# Patient Record
Sex: Male | Born: 1935 | Race: White | Hispanic: No | Marital: Married | Smoking: Former smoker
Health system: Southern US, Community
[De-identification: ages and names within clinical notes are randomized; demographics above are authoritative.]

## PROBLEM LIST (undated history)

## (undated) DIAGNOSIS — H409 Unspecified glaucoma: Secondary | ICD-10-CM

## (undated) DIAGNOSIS — R972 Elevated prostate specific antigen [PSA]: Secondary | ICD-10-CM

## (undated) DIAGNOSIS — I219 Acute myocardial infarction, unspecified: Secondary | ICD-10-CM

## (undated) DIAGNOSIS — E785 Hyperlipidemia, unspecified: Secondary | ICD-10-CM

## (undated) DIAGNOSIS — H348122 Central retinal vein occlusion, left eye, stable: Secondary | ICD-10-CM

## (undated) DIAGNOSIS — I714 Abdominal aortic aneurysm, without rupture, unspecified: Secondary | ICD-10-CM

## (undated) DIAGNOSIS — I2510 Atherosclerotic heart disease of native coronary artery without angina pectoris: Secondary | ICD-10-CM

## (undated) DIAGNOSIS — D649 Anemia, unspecified: Secondary | ICD-10-CM

## (undated) DIAGNOSIS — I10 Essential (primary) hypertension: Secondary | ICD-10-CM

## (undated) DIAGNOSIS — C44300 Unspecified malignant neoplasm of skin of unspecified part of face: Secondary | ICD-10-CM

## (undated) DIAGNOSIS — IMO0002 Reserved for concepts with insufficient information to code with codable children: Secondary | ICD-10-CM

## (undated) DIAGNOSIS — N289 Disorder of kidney and ureter, unspecified: Secondary | ICD-10-CM

## (undated) HISTORY — PX: INNER EAR SURGERY: SHX679

## (undated) HISTORY — DX: Reserved for concepts with insufficient information to code with codable children: IMO0002

## (undated) HISTORY — PX: HERNIA REPAIR: SHX51

## (undated) HISTORY — DX: Anemia, unspecified: D64.9

## (undated) HISTORY — DX: Disorder of kidney and ureter, unspecified: N28.9

## (undated) HISTORY — DX: Essential (primary) hypertension: I10

## (undated) HISTORY — DX: Atherosclerotic heart disease of native coronary artery without angina pectoris: I25.10

## (undated) HISTORY — DX: Acute myocardial infarction, unspecified: I21.9

## (undated) HISTORY — DX: Abdominal aortic aneurysm, without rupture, unspecified: I71.40

## (undated) HISTORY — DX: Abdominal aortic aneurysm, without rupture: I71.4

## (undated) HISTORY — DX: Elevated prostate specific antigen (PSA): R97.20

## (undated) HISTORY — DX: Hyperlipidemia, unspecified: E78.5

---

## 1979-06-27 HISTORY — PX: CORONARY ARTERY BYPASS GRAFT: SHX141

## 2001-07-24 ENCOUNTER — Encounter: Admission: RE | Admit: 2001-07-24 | Discharge: 2001-07-24 | Payer: Self-pay | Admitting: Urology

## 2001-07-24 ENCOUNTER — Encounter: Payer: Self-pay | Admitting: Urology

## 2001-07-26 ENCOUNTER — Encounter (INDEPENDENT_AMBULATORY_CARE_PROVIDER_SITE_OTHER): Payer: Self-pay

## 2001-07-26 ENCOUNTER — Ambulatory Visit (HOSPITAL_BASED_OUTPATIENT_CLINIC_OR_DEPARTMENT_OTHER): Admission: RE | Admit: 2001-07-26 | Discharge: 2001-07-26 | Payer: Self-pay | Admitting: Urology

## 2001-11-22 ENCOUNTER — Encounter: Payer: Self-pay | Admitting: Internal Medicine

## 2001-11-22 ENCOUNTER — Ambulatory Visit (HOSPITAL_COMMUNITY): Admission: RE | Admit: 2001-11-22 | Discharge: 2001-11-22 | Payer: Self-pay | Admitting: Internal Medicine

## 2001-11-25 ENCOUNTER — Ambulatory Visit (HOSPITAL_COMMUNITY): Admission: RE | Admit: 2001-11-25 | Discharge: 2001-11-25 | Payer: Self-pay | Admitting: Specialist

## 2001-11-30 ENCOUNTER — Encounter: Admission: RE | Admit: 2001-11-30 | Discharge: 2001-11-30 | Payer: Self-pay | Admitting: Internal Medicine

## 2001-11-30 ENCOUNTER — Encounter: Payer: Self-pay | Admitting: Internal Medicine

## 2004-04-15 ENCOUNTER — Inpatient Hospital Stay (HOSPITAL_COMMUNITY): Admission: EM | Admit: 2004-04-15 | Discharge: 2004-04-16 | Payer: Self-pay | Admitting: Emergency Medicine

## 2004-04-26 ENCOUNTER — Ambulatory Visit: Payer: Self-pay

## 2004-05-04 ENCOUNTER — Ambulatory Visit: Payer: Self-pay | Admitting: *Deleted

## 2004-08-17 ENCOUNTER — Ambulatory Visit: Payer: Self-pay | Admitting: *Deleted

## 2004-08-18 ENCOUNTER — Ambulatory Visit: Payer: Self-pay | Admitting: *Deleted

## 2004-10-11 ENCOUNTER — Ambulatory Visit: Payer: Self-pay

## 2005-02-21 ENCOUNTER — Ambulatory Visit: Payer: Self-pay | Admitting: *Deleted

## 2005-03-07 ENCOUNTER — Ambulatory Visit: Payer: Self-pay | Admitting: *Deleted

## 2005-03-17 ENCOUNTER — Ambulatory Visit: Payer: Self-pay | Admitting: *Deleted

## 2005-03-22 ENCOUNTER — Ambulatory Visit: Payer: Self-pay | Admitting: *Deleted

## 2005-03-24 ENCOUNTER — Ambulatory Visit: Payer: Self-pay

## 2005-03-24 ENCOUNTER — Ambulatory Visit: Payer: Self-pay | Admitting: *Deleted

## 2005-04-03 ENCOUNTER — Ambulatory Visit: Payer: Self-pay

## 2005-04-11 ENCOUNTER — Ambulatory Visit: Payer: Self-pay | Admitting: Internal Medicine

## 2005-04-11 ENCOUNTER — Ambulatory Visit: Payer: Self-pay | Admitting: Cardiology

## 2005-04-12 ENCOUNTER — Ambulatory Visit: Payer: Self-pay | Admitting: *Deleted

## 2005-04-17 ENCOUNTER — Encounter: Admission: RE | Admit: 2005-04-17 | Discharge: 2005-04-17 | Payer: Self-pay | Admitting: Nephrology

## 2005-04-25 ENCOUNTER — Ambulatory Visit: Payer: Self-pay | Admitting: Cardiology

## 2005-05-08 ENCOUNTER — Encounter (INDEPENDENT_AMBULATORY_CARE_PROVIDER_SITE_OTHER): Payer: Self-pay | Admitting: Specialist

## 2005-05-08 ENCOUNTER — Ambulatory Visit (HOSPITAL_COMMUNITY): Admission: RE | Admit: 2005-05-08 | Discharge: 2005-05-09 | Payer: Self-pay | Admitting: Nephrology

## 2005-05-23 ENCOUNTER — Ambulatory Visit: Payer: Self-pay | Admitting: *Deleted

## 2005-06-06 ENCOUNTER — Ambulatory Visit: Payer: Self-pay

## 2005-06-13 ENCOUNTER — Encounter (HOSPITAL_COMMUNITY): Admission: RE | Admit: 2005-06-13 | Discharge: 2005-09-11 | Payer: Self-pay | Admitting: Nephrology

## 2005-06-30 ENCOUNTER — Ambulatory Visit: Payer: Self-pay | Admitting: Internal Medicine

## 2005-07-07 ENCOUNTER — Ambulatory Visit: Payer: Self-pay | Admitting: Internal Medicine

## 2005-08-01 ENCOUNTER — Ambulatory Visit: Payer: Self-pay | Admitting: *Deleted

## 2005-10-02 ENCOUNTER — Ambulatory Visit: Payer: Self-pay | Admitting: Internal Medicine

## 2005-10-19 ENCOUNTER — Ambulatory Visit: Payer: Self-pay | Admitting: Internal Medicine

## 2005-11-03 ENCOUNTER — Ambulatory Visit: Payer: Self-pay | Admitting: Gastroenterology

## 2005-11-06 ENCOUNTER — Encounter (HOSPITAL_COMMUNITY): Admission: RE | Admit: 2005-11-06 | Discharge: 2006-02-04 | Payer: Self-pay | Admitting: Nephrology

## 2006-02-28 ENCOUNTER — Encounter (HOSPITAL_COMMUNITY): Admission: RE | Admit: 2006-02-28 | Discharge: 2006-05-29 | Payer: Self-pay | Admitting: Nephrology

## 2006-03-01 ENCOUNTER — Ambulatory Visit: Payer: Self-pay | Admitting: *Deleted

## 2006-03-02 ENCOUNTER — Ambulatory Visit: Payer: Self-pay | Admitting: *Deleted

## 2006-04-09 ENCOUNTER — Ambulatory Visit: Payer: Self-pay | Admitting: Internal Medicine

## 2006-04-30 ENCOUNTER — Encounter: Payer: Self-pay | Admitting: Internal Medicine

## 2006-04-30 ENCOUNTER — Ambulatory Visit: Payer: Self-pay

## 2006-05-09 ENCOUNTER — Ambulatory Visit: Payer: Self-pay

## 2006-06-12 ENCOUNTER — Ambulatory Visit: Payer: Self-pay | Admitting: Internal Medicine

## 2006-06-13 ENCOUNTER — Encounter (HOSPITAL_COMMUNITY): Admission: RE | Admit: 2006-06-13 | Discharge: 2006-09-11 | Payer: Self-pay | Admitting: Nephrology

## 2006-07-16 ENCOUNTER — Ambulatory Visit: Payer: Self-pay | Admitting: *Deleted

## 2006-08-07 ENCOUNTER — Ambulatory Visit: Payer: Self-pay | Admitting: Internal Medicine

## 2006-09-12 ENCOUNTER — Encounter (HOSPITAL_COMMUNITY): Admission: RE | Admit: 2006-09-12 | Discharge: 2006-12-11 | Payer: Self-pay | Admitting: Nephrology

## 2006-11-15 ENCOUNTER — Ambulatory Visit: Payer: Self-pay | Admitting: *Deleted

## 2006-11-15 ENCOUNTER — Ambulatory Visit: Payer: Self-pay

## 2006-11-15 LAB — CONVERTED CEMR LAB
HDL: 37.7 mg/dL — ABNORMAL LOW (ref >39.0)
LDL Cholesterol: 101 mg/dL — ABNORMAL HIGH (ref 0–99)
VLDL: 16 mg/dL (ref 0–40)

## 2006-11-22 ENCOUNTER — Ambulatory Visit: Payer: Self-pay | Admitting: *Deleted

## 2006-12-12 ENCOUNTER — Ambulatory Visit: Payer: Self-pay | Admitting: Internal Medicine

## 2007-01-01 ENCOUNTER — Encounter (HOSPITAL_COMMUNITY): Admission: RE | Admit: 2007-01-01 | Discharge: 2007-04-01 | Payer: Self-pay | Admitting: Nephrology

## 2007-02-07 ENCOUNTER — Ambulatory Visit: Payer: Self-pay | Admitting: Cardiovascular Disease

## 2007-03-11 ENCOUNTER — Ambulatory Visit: Payer: Self-pay | Admitting: Cardiovascular Disease

## 2007-03-11 LAB — CONVERTED CEMR LAB
AST: 19 units/L (ref 0–37)
Albumin: 3.9 g/dL (ref 3.5–5.2)
Alkaline Phosphatase: 47 units/L (ref 39–117)
Total Bilirubin: 0.9 mg/dL (ref 0.3–1.2)
Triglycerides: 88 mg/dL (ref 0–149)

## 2007-05-02 ENCOUNTER — Ambulatory Visit: Payer: Self-pay

## 2007-05-02 ENCOUNTER — Encounter: Payer: Self-pay | Admitting: Cardiovascular Disease

## 2007-05-15 ENCOUNTER — Encounter: Payer: Self-pay | Admitting: Internal Medicine

## 2007-05-15 DIAGNOSIS — I25810 Atherosclerosis of coronary artery bypass graft(s) without angina pectoris: Secondary | ICD-10-CM | POA: Insufficient documentation

## 2007-05-15 DIAGNOSIS — E785 Hyperlipidemia, unspecified: Secondary | ICD-10-CM | POA: Insufficient documentation

## 2007-05-15 DIAGNOSIS — I10 Essential (primary) hypertension: Secondary | ICD-10-CM

## 2007-08-01 ENCOUNTER — Ambulatory Visit: Payer: Self-pay | Admitting: Cardiovascular Disease

## 2007-10-15 ENCOUNTER — Encounter: Payer: Self-pay | Admitting: Internal Medicine

## 2008-03-03 ENCOUNTER — Ambulatory Visit: Payer: Self-pay | Admitting: Cardiovascular Disease

## 2008-03-03 ENCOUNTER — Ambulatory Visit: Payer: Self-pay

## 2008-03-03 LAB — CONVERTED CEMR LAB
Alkaline Phosphatase: 49 units/L (ref 39–117)
Bilirubin, Direct: 0.1 mg/dL (ref 0.0–0.3)
Cholesterol: 110 mg/dL (ref 0–200)
LDL Cholesterol: 60 mg/dL (ref 0–99)
Total Bilirubin: 0.8 mg/dL (ref 0.3–1.2)
Total CHOL/HDL Ratio: 3.4

## 2008-03-08 ENCOUNTER — Telehealth: Payer: Self-pay | Admitting: Internal Medicine

## 2008-03-08 ENCOUNTER — Emergency Department (HOSPITAL_COMMUNITY): Admission: EM | Admit: 2008-03-08 | Discharge: 2008-03-08 | Payer: Self-pay | Admitting: Emergency Medicine

## 2008-03-08 ENCOUNTER — Encounter: Payer: Self-pay | Admitting: Internal Medicine

## 2008-03-09 ENCOUNTER — Telehealth: Payer: Self-pay | Admitting: Internal Medicine

## 2008-03-11 ENCOUNTER — Ambulatory Visit: Payer: Self-pay | Admitting: Internal Medicine

## 2008-03-11 DIAGNOSIS — L02619 Cutaneous abscess of unspecified foot: Secondary | ICD-10-CM

## 2008-03-11 DIAGNOSIS — L03119 Cellulitis of unspecified part of limb: Secondary | ICD-10-CM

## 2008-03-11 DIAGNOSIS — M109 Gout, unspecified: Secondary | ICD-10-CM

## 2008-03-11 DIAGNOSIS — M79609 Pain in unspecified limb: Secondary | ICD-10-CM

## 2008-03-27 ENCOUNTER — Ambulatory Visit: Payer: Self-pay | Admitting: Internal Medicine

## 2008-04-06 ENCOUNTER — Ambulatory Visit: Payer: Self-pay | Admitting: Internal Medicine

## 2008-04-07 ENCOUNTER — Encounter: Payer: Self-pay | Admitting: Internal Medicine

## 2008-04-07 LAB — CONVERTED CEMR LAB
Basophils Absolute: 0 10*3/uL (ref 0.0–0.1)
Calcium: 9.4 mg/dL (ref 8.4–10.5)
Creatinine, Ser: 1.7 mg/dL — ABNORMAL HIGH (ref 0.4–1.5)
GFR calc Af Amer: 50 mL/min
Glucose, Bld: 106 mg/dL — ABNORMAL HIGH (ref 70–99)
HCT: 36.1 % — ABNORMAL LOW (ref 39.0–52.0)
Hemoglobin: 12.2 g/dL — ABNORMAL LOW (ref 13.0–17.0)
MCHC: 33.8 g/dL (ref 30.0–36.0)
Monocytes Absolute: 0.5 10*3/uL (ref 0.1–1.0)
Neutro Abs: 3.5 10*3/uL (ref 1.4–7.7)
RDW: 14.4 % (ref 11.5–14.6)
Sodium: 143 meq/L (ref 135–145)
Uric Acid, Serum: 9.6 mg/dL — ABNORMAL HIGH (ref 4.0–7.8)

## 2008-04-09 ENCOUNTER — Ambulatory Visit: Payer: Self-pay | Admitting: Internal Medicine

## 2008-07-02 ENCOUNTER — Ambulatory Visit: Payer: Self-pay | Admitting: Internal Medicine

## 2008-07-03 LAB — CONVERTED CEMR LAB
Calcium: 9.3 mg/dL (ref 8.4–10.5)
Creatinine, Ser: 1.5 mg/dL (ref 0.4–1.5)
GFR calc Af Amer: 57 mL/min
Glucose, Bld: 106 mg/dL — ABNORMAL HIGH (ref 70–99)
Sodium: 143 meq/L (ref 135–145)

## 2008-07-06 ENCOUNTER — Ambulatory Visit: Payer: Self-pay | Admitting: Internal Medicine

## 2008-08-26 ENCOUNTER — Encounter: Payer: Self-pay | Admitting: Internal Medicine

## 2008-08-31 ENCOUNTER — Encounter: Payer: Self-pay | Admitting: Cardiovascular Disease

## 2008-08-31 ENCOUNTER — Ambulatory Visit: Payer: Self-pay | Admitting: Cardiovascular Disease

## 2008-08-31 DIAGNOSIS — I714 Abdominal aortic aneurysm, without rupture, unspecified: Secondary | ICD-10-CM | POA: Insufficient documentation

## 2008-09-02 ENCOUNTER — Ambulatory Visit: Payer: Self-pay

## 2008-10-12 ENCOUNTER — Ambulatory Visit: Payer: Self-pay | Admitting: Internal Medicine

## 2008-10-12 LAB — CONVERTED CEMR LAB
Calcium: 9.2 mg/dL (ref 8.4–10.5)
Creatinine, Ser: 1.6 mg/dL — ABNORMAL HIGH (ref 0.4–1.5)
GFR calc non Af Amer: 43.74 mL/min (ref >60)
Glucose, Bld: 106 mg/dL — ABNORMAL HIGH (ref 70–99)
HDL: 36.2 mg/dL — ABNORMAL LOW (ref >39.00)
Sodium: 145 meq/L (ref 135–145)
Total CHOL/HDL Ratio: 3
Uric Acid, Serum: 7.8 mg/dL (ref 4.0–7.8)

## 2008-10-19 ENCOUNTER — Ambulatory Visit: Payer: Self-pay | Admitting: Internal Medicine

## 2008-10-19 DIAGNOSIS — N259 Disorder resulting from impaired renal tubular function, unspecified: Secondary | ICD-10-CM | POA: Insufficient documentation

## 2008-12-30 ENCOUNTER — Telehealth: Payer: Self-pay | Admitting: Internal Medicine

## 2008-12-30 ENCOUNTER — Ambulatory Visit: Payer: Self-pay | Admitting: Internal Medicine

## 2009-01-19 ENCOUNTER — Telehealth (INDEPENDENT_AMBULATORY_CARE_PROVIDER_SITE_OTHER): Payer: Self-pay | Admitting: *Deleted

## 2009-01-20 ENCOUNTER — Encounter: Payer: Self-pay | Admitting: Cardiovascular Disease

## 2009-01-28 ENCOUNTER — Ambulatory Visit: Payer: Self-pay | Admitting: Internal Medicine

## 2009-01-28 LAB — CONVERTED CEMR LAB
Albumin: 3.8 g/dL (ref 3.5–5.2)
CO2: 29 meq/L (ref 19–32)
Chloride: 101 meq/L (ref 96–112)
Cholesterol: 107 mg/dL (ref 0–200)
Creatinine, Ser: 1.5 mg/dL (ref 0.4–1.5)
HDL: 45.3 mg/dL (ref >39.00)
LDL Cholesterol: 54 mg/dL (ref 0–99)
Potassium: 4.2 meq/L (ref 3.5–5.1)
Sodium: 145 meq/L (ref 135–145)
Total CHOL/HDL Ratio: 2
Total Protein: 6.7 g/dL (ref 6.0–8.3)
Triglycerides: 41 mg/dL (ref 0.0–149.0)
Uric Acid, Serum: 6.1 mg/dL (ref 4.0–7.8)
VLDL: 8.2 mg/dL (ref 0.0–40.0)

## 2009-02-03 ENCOUNTER — Telehealth: Payer: Self-pay | Admitting: Internal Medicine

## 2009-02-12 ENCOUNTER — Ambulatory Visit: Payer: Self-pay | Admitting: Internal Medicine

## 2009-03-04 ENCOUNTER — Encounter: Payer: Self-pay | Admitting: Cardiovascular Disease

## 2009-03-04 ENCOUNTER — Ambulatory Visit: Payer: Self-pay

## 2009-03-25 ENCOUNTER — Ambulatory Visit: Payer: Self-pay | Admitting: Cardiovascular Disease

## 2009-03-29 ENCOUNTER — Ambulatory Visit: Payer: Self-pay | Admitting: Internal Medicine

## 2009-05-04 ENCOUNTER — Encounter: Payer: Self-pay | Admitting: Internal Medicine

## 2009-06-29 ENCOUNTER — Ambulatory Visit: Payer: Self-pay | Admitting: Internal Medicine

## 2009-06-29 LAB — CONVERTED CEMR LAB
ALT: 11 units/L (ref 0–53)
Albumin: 4.1 g/dL (ref 3.5–5.2)
Alkaline Phosphatase: 62 units/L (ref 39–117)
Bilirubin, Direct: 0.1 mg/dL (ref 0.0–0.3)
Chloride: 99 meq/L (ref 96–112)
Cholesterol: 126 mg/dL (ref 0–200)
GFR calc non Af Amer: 43.66 mL/min (ref >60)
LDL Cholesterol: 64 mg/dL (ref 0–99)
Potassium: 4 meq/L (ref 3.5–5.1)
Sodium: 141 meq/L (ref 135–145)
Total Protein: 7.5 g/dL (ref 6.0–8.3)
Triglycerides: 91 mg/dL (ref 0.0–149.0)
VLDL: 18.2 mg/dL (ref 0.0–40.0)

## 2009-07-08 ENCOUNTER — Ambulatory Visit: Payer: Self-pay | Admitting: Internal Medicine

## 2009-07-08 DIAGNOSIS — Z87891 Personal history of nicotine dependence: Secondary | ICD-10-CM

## 2009-09-06 ENCOUNTER — Ambulatory Visit: Payer: Self-pay

## 2009-09-06 ENCOUNTER — Encounter: Payer: Self-pay | Admitting: Cardiovascular Disease

## 2009-09-09 ENCOUNTER — Ambulatory Visit: Payer: Self-pay | Admitting: Internal Medicine

## 2009-09-09 LAB — CONVERTED CEMR LAB
BUN: 32 mg/dL — ABNORMAL HIGH (ref 6–23)
Chloride: 104 meq/L (ref 96–112)
Creatinine, Ser: 1.5 mg/dL (ref 0.4–1.5)
GFR calc non Af Amer: 47.02 mL/min (ref >60)
Potassium: 4.1 meq/L (ref 3.5–5.1)

## 2009-09-27 ENCOUNTER — Telehealth: Payer: Self-pay | Admitting: Internal Medicine

## 2009-09-30 ENCOUNTER — Ambulatory Visit: Payer: Self-pay | Admitting: Cardiovascular Disease

## 2009-12-06 ENCOUNTER — Ambulatory Visit: Payer: Self-pay | Admitting: Internal Medicine

## 2009-12-06 LAB — CONVERTED CEMR LAB
ALT: 10 units/L (ref 0–53)
AST: 18 units/L (ref 0–37)
Albumin: 3.5 g/dL (ref 3.5–5.2)
Calcium: 8.8 mg/dL (ref 8.4–10.5)
GFR calc non Af Amer: 65.83 mL/min (ref >60)
Glucose, Bld: 84 mg/dL (ref 70–99)
HDL: 32.9 mg/dL — ABNORMAL LOW (ref >39.00)
Potassium: 4.8 meq/L (ref 3.5–5.1)
Sodium: 142 meq/L (ref 135–145)
Total Protein: 6 g/dL (ref 6.0–8.3)
Triglycerides: 83 mg/dL (ref 0.0–149.0)
VLDL: 16.6 mg/dL (ref 0.0–40.0)

## 2009-12-10 ENCOUNTER — Ambulatory Visit: Payer: Self-pay | Admitting: Internal Medicine

## 2010-03-31 ENCOUNTER — Ambulatory Visit: Payer: Self-pay | Admitting: Internal Medicine

## 2010-03-31 LAB — CONVERTED CEMR LAB
ALT: 13 units/L (ref 0–53)
AST: 18 units/L (ref 0–37)
Albumin: 3.9 g/dL (ref 3.5–5.2)
Alkaline Phosphatase: 53 units/L (ref 39–117)
Basophils Absolute: 0 10*3/uL (ref 0.0–0.1)
Basophils Relative: 0.4 % (ref 0.0–3.0)
Bilirubin Urine: NEGATIVE
Calcium: 8.9 mg/dL (ref 8.4–10.5)
Eosinophils Relative: 9.9 % — ABNORMAL HIGH (ref 0.0–5.0)
GFR calc non Af Amer: 52.14 mL/min (ref >60)
HCT: 35.8 % — ABNORMAL LOW (ref 39.0–52.0)
HDL: 44.7 mg/dL (ref >39.00)
Hemoglobin, Urine: NEGATIVE
Hemoglobin: 12 g/dL — ABNORMAL LOW (ref 13.0–17.0)
Lymphocytes Relative: 16.1 % (ref 12.0–46.0)
Monocytes Relative: 7 % (ref 3.0–12.0)
Neutro Abs: 4.8 10*3/uL (ref 1.4–7.7)
PSA: 11.42 ng/mL — ABNORMAL HIGH (ref 0.10–4.00)
Potassium: 4.8 meq/L (ref 3.5–5.1)
RBC: 3.97 M/uL — ABNORMAL LOW (ref 4.22–5.81)
RDW: 15.5 % — ABNORMAL HIGH (ref 11.5–14.6)
Sodium: 141 meq/L (ref 135–145)
Total CHOL/HDL Ratio: 2
WBC: 7.2 10*3/uL (ref 4.5–10.5)
pH: 6.5 (ref 5.0–8.0)

## 2010-04-04 ENCOUNTER — Ambulatory Visit: Payer: Self-pay | Admitting: Internal Medicine

## 2010-04-04 DIAGNOSIS — R972 Elevated prostate specific antigen [PSA]: Secondary | ICD-10-CM

## 2010-04-04 DIAGNOSIS — D649 Anemia, unspecified: Secondary | ICD-10-CM

## 2010-04-21 ENCOUNTER — Encounter: Payer: Self-pay | Admitting: Cardiovascular Disease

## 2010-04-22 ENCOUNTER — Ambulatory Visit: Payer: Self-pay

## 2010-04-22 ENCOUNTER — Ambulatory Visit: Payer: Self-pay | Admitting: Cardiovascular Disease

## 2010-04-22 ENCOUNTER — Encounter: Payer: Self-pay | Admitting: Cardiovascular Disease

## 2010-06-01 ENCOUNTER — Encounter: Payer: Self-pay | Admitting: Internal Medicine

## 2010-07-26 ENCOUNTER — Other Ambulatory Visit: Payer: Self-pay | Admitting: Internal Medicine

## 2010-07-26 ENCOUNTER — Ambulatory Visit
Admission: RE | Admit: 2010-07-26 | Discharge: 2010-07-26 | Payer: Self-pay | Source: Home / Self Care | Attending: Internal Medicine | Admitting: Internal Medicine

## 2010-07-26 LAB — CBC WITH DIFFERENTIAL/PLATELET
Basophils Relative: 0.5 % (ref 0.0–3.0)
Eosinophils Relative: 10.7 % — ABNORMAL HIGH (ref 0.0–5.0)
Lymphocytes Relative: 17.1 % (ref 12.0–46.0)
Monocytes Relative: 7.7 % (ref 3.0–12.0)
Neutrophils Relative %: 64 % (ref 43.0–77.0)
RBC: 4.13 Mil/uL — ABNORMAL LOW (ref 4.22–5.81)
WBC: 7.4 10*3/uL (ref 4.5–10.5)

## 2010-07-26 LAB — BASIC METABOLIC PANEL
Calcium: 9.1 mg/dL (ref 8.4–10.5)
Creatinine, Ser: 1.7 mg/dL — ABNORMAL HIGH (ref 0.4–1.5)
Sodium: 143 mEq/L (ref 135–145)

## 2010-07-28 NOTE — Assessment & Plan Note (Signed)
Summary: 4 MIO ROV /NWS  #   Vital Signs:  Patient profile:   77 year old male Height:      70 inches Weight:      168 pounds BMI:     24.19 Temp:     97.3 degrees F oral Pulse rate:   68 / minute Pulse rhythm:   regular Resp:     16 per minute BP sitting:   130 / 68  (left arm) Cuff size:   regular  Vitals Entered By: Lanier Prude, CMA(AAMA) (April 04, 2010 10:16 AM) CC: 4 mo f/u Is Patient Diabetic? No   Primary Care Provider:  Tresa Garter MD  CC:  4 mo f/u.  History of Present Illness: The patient presents for a follow up of hypertension, CAD, hyperlipidemia The patient presents for a preventive health examination  Patient past medical history, social history, and family history reviewed in detail no significant changes.  Patient is physically active. Depression is negative and mood is good. Hearing is normal w/hearing aids, and able to perform activities of daily living. Risk of falling is negligible and home safety has been reviewed and is appropriate. Patient has normal height, weight, and visual acuity. Patient has been counseled on age-appropriate routine health concerns for screening and prevention. Education, counseling done.    Preventive Screening-Counseling & Management  Alcohol-Tobacco     Alcohol drinks/day: 0     Smoking Status: quit > 6 months  Caffeine-Diet-Exercise     Caffeine Counseling: decrease use of caffeine     Diet Counseling: to improve diet; diet is suboptimal     Type of exercise: walking     Exercise (avg: min/session): 30-60     Depression Counseling: not indicated; screening negative for depression  Hep-HIV-STD-Contraception     Hepatitis Risk: no risk noted     Sun Exposure-Excessive: no  Safety-Violence-Falls     Seat Belt Use: yes     Firearms in the Home: firearms in the home     Violence in the Home: no risk noted     Fall Risk Counseling: not indicated; no significant falls noted      Sexual History:  currently  monogamous.    Current Medications (verified): 1)  Terazosin Hcl 5 Mg Caps (Terazosin Hcl) .... Once Daily 2)  Aspirin 325 Mg  Tabs (Aspirin) .... 1/2 Once Daily 3)  Lasix 80 Mg  Tabs (Furosemide) .... Two Times A Day 4)  Nitroglycerin 0.4 Mg  Subl (Nitroglycerin) .... As Needed 5)  Colcrys 0.6 Mg Tabs (Colchicine) .Marland Kitchen.. 1 By Mouth Once Daily or Up To Three Times A Day As Needed Gout 6)  Allopurinol 100 Mg Tabs (Allopurinol) .... 2 By Mouth Daily 7)  Vytorin 10-40 Mg Tabs (Ezetimibe-Simvastatin) .... Take One Tab P.o. At Bedtime 8)  Promethazine Hcl 25 Mg  Tabs (Promethazine Hcl) .Marland Kitchen.. 1 By Mouth Three Times A Day As Needed Nausea 9)  Lumigan 0.03 % Soln (Bimatoprost) .... One Drop Each Eye At Night 10)  Camagam Eye Drops .... One Drop Each Eye Two Times A Day  Allergies (verified): 1)  ! Sulfacetamide Sodium-Sulfur (Sulfacetamide Sodium-Sulfur)  Past History:  Past Surgical History: Last updated: 08/28/2008 CABG 1981  Family History: Last updated: 03/11/2008 Family History Hypertension  Social History: Last updated: 03/29/2009 Retired Married x 68 years in 2010 Former Smoker Alcohol use-no  Past Medical History: Coronary artery disease s/p CABG and multiple PCI's Hyperlipidemia Hypertension AAA - 5 cm on 2010 assessment Gout  2009 Renal insufficiency Elev PSA Dr Earlene Plater  Social History: Smoking Status:  quit > 6 months Hepatitis Risk:  no risk noted Sun Exposure-Excessive:  no Seat Belt Use:  yes Sexual History:  currently monogamous  Review of Systems  The patient denies anorexia, fever, weight loss, weight gain, vision loss, decreased hearing, hoarseness, chest pain, syncope, dyspnea on exertion, peripheral edema, prolonged cough, headaches, hemoptysis, abdominal pain, melena, hematochezia, severe indigestion/heartburn, hematuria, incontinence, genital sores, muscle weakness, suspicious skin lesions, transient blindness, difficulty walking, depression, unusual weight  change, abnormal bleeding, enlarged lymph nodes, angioedema, and testicular masses.    Physical Exam  General:  Pt is alert and oriented, in no acute distress, elderly gentleman. HEENT: normal Neck: normal carotid upstrokes without bruits, JVP normal Lungs: CTA CV: RRR without grade 2/6 systolic ejection murmur, best heard at the right upper sternal border Abd: soft, NT, positive BS, no bruit, no organomegaly,abdominal aorta is easily palpable and enlarged. Ext: no clubbing, cyanosis. peripheral pulses 2+ and equal. trace pretibial edema bilaterally Skin: warm and dry without rash  Ears:  hearing aids B Heart:  Normal rate and regular rhythm. S1 and S2 normal without gallop. 2/.6 murmur Rectal:  per Dr Ricky Stabs:  per Dr Earlene Plater Prostate:  per Dr Earlene Plater   Impression & Recommendations:  Problem # 1:  Preventive Health Care (ICD-V70.0) Assessment Improved  No need for screen colon Overall doing well, age appropriate education and counseling updated and referral for appropriate preventive services done unless declined, immunizations up to date or declined, diet counseling done if overweight, urged to quit smoking if smokes, most recent labs reviewed and current ordered if appropriate, ecg reviewed or declined (interpretation per ECG scanned in the EMR if done); information regarding Medicare Preventation requirements given if appropriate.  The labs were reviewed with the patient.   Problem # 2:  HYPERLIPIDEMIA (ICD-272.4)  His updated medication list for this problem includes:    Vytorin 10-40 Mg Tabs (Ezetimibe-simvastatin) .Marland Kitchen... Take one tab p.o. at bedtime  Problem # 3:  GOUT, UNSPECIFIED (ICD-274.9) Assessment: Improved  His updated medication list for this problem includes:    Colcrys 0.6 Mg Tabs (Colchicine) .Marland Kitchen... 1 by mouth once daily or up to three times a day as needed gout    Allopurinol 100 Mg Tabs (Allopurinol) .Marland Kitchen... 2 by mouth daily  Problem # 4:  CORONARY ARTERY  DISEASE (ICD-414.00) Assessment: Unchanged  His updated medication list for this problem includes:    Terazosin Hcl 5 Mg Caps (Terazosin hcl) ..... Once daily    Aspirin 325 Mg Tabs (Aspirin) .Marland Kitchen... 1/2 once daily    Lasix 80 Mg Tabs (Furosemide) .Marland Kitchen..Marland Kitchen Two times a day    Nitroglycerin 0.4 Mg Subl (Nitroglycerin) .Marland Kitchen... As needed  Problem # 5:  HYPERTENSION (ICD-401.9)  His updated medication list for this problem includes:    Terazosin Hcl 5 Mg Caps (Terazosin hcl) ..... Once daily    Lasix 80 Mg Tabs (Furosemide) .Marland Kitchen..Marland Kitchen Two times a day  Problem # 6:  RENAL INSUFFICIENCY (ICD-588.9)  Problem # 7:  PSA, INCREASED (ICD-790.93) Assessment: Unchanged Dr Earlene Plater q 6 months   Problem # 8:  ANEMIA, NORMOCYTIC (ICD-285.9) Assessment: Comment Only Likely due to CRI  Complete Medication List: 1)  Terazosin Hcl 5 Mg Caps (Terazosin hcl) .... Once daily 2)  Aspirin 325 Mg Tabs (Aspirin) .... 1/2 once daily 3)  Lasix 80 Mg Tabs (Furosemide) .... Two times a day 4)  Nitroglycerin 0.4 Mg Subl (Nitroglycerin) .... As  needed 5)  Colcrys 0.6 Mg Tabs (Colchicine) .Marland Kitchen.. 1 by mouth once daily or up to three times a day as needed gout 6)  Allopurinol 100 Mg Tabs (Allopurinol) .... 2 by mouth daily 7)  Vytorin 10-40 Mg Tabs (Ezetimibe-simvastatin) .... Take one tab p.o. at bedtime 8)  Promethazine Hcl 25 Mg Tabs (Promethazine hcl) .Marland Kitchen.. 1 by mouth three times a day as needed nausea 9)  Lumigan 0.03 % Soln (Bimatoprost) .... One drop each eye at night 10)  Camagam Eye Drops  .... One drop each eye two times a day  Other Orders: Medicare -1st Annual Wellness Visit [phone])  Patient Instructions: 1)  Please schedule a follow-up appointment in 4 months. 2)  BMP prior to visit, ICD-9: 3)  CBC w/ Diff prior to visit, ICD-9:585  280.9   Immunization History:  Influenza Immunization History:    Influenza:  historical (03/11/2010)  Pneumovax Immunization History:    Pneumovax:  historical  (04/04/2002)

## 2010-07-28 NOTE — Assessment & Plan Note (Signed)
Summary: f56m   Visit Type:  6 months follow up Primary Provider:  Tresa Garter MD  CC:  no cardiac complaints.  History of Present Illness: this is an 77 year old gentleman who presents today for followup of coronary artery disease and AAA. He underwent remote multivessel coronary bypass surgery in 1981 and has had multiple subsequent PCI procedures. He has had a 5cm AAA followed by serial ultrasound.  The patient has no complaints today. He denies chest pain, dyspnea, orthopnea, PND, or edema. He denies abdominal pain.  Current Medications (verified): 1)  Terazosin Hcl 5 Mg Caps (Terazosin Hcl) .... Once Daily 2)  Aspirin 325 Mg  Tabs (Aspirin) .... 1/2 Once Daily 3)  Lasix 80 Mg  Tabs (Furosemide) .... Two Times A Day 4)  Nitroglycerin 0.4 Mg  Subl (Nitroglycerin) .... As Needed 5)  Allopurinol 100 Mg Tabs (Allopurinol) .... 2 By Mouth Daily 6)  Vytorin 10-40 Mg Tabs (Ezetimibe-Simvastatin) .... Take One Tab P.o. At Bedtime 7)  Promethazine Hcl 25 Mg  Tabs (Promethazine Hcl) .Marland Kitchen.. 1 By Mouth Three Times A Day As Needed Nausea 8)  Lumigan 0.03 % Soln (Bimatoprost) .... One Drop Each Eye At Night 9)  Camagam Eye Drops .... One Drop Each Eye Two Times A Day  Allergies: 1)  ! Sulfacetamide Sodium-Sulfur (Sulfacetamide Sodium-Sulfur)  Past History:  Past medical history reviewed for relevance to current acute and chronic problems.  Past Medical History: Coronary artery disease s/p CABG and multiple PCI's Hyperlipidemia Hypertension AAA - 5 cm on 2011 assessment Gout 2009 Renal insufficiency Elev PSA Dr Earlene Plater  Review of Systems       Negative except as per HPI   Vital Signs:  Patient profile:   77 year old male Height:      70 inches Weight:      164.50 pounds BMI:     23.69 Pulse rate:   54 / minute Pulse rhythm:   regular Resp:     18 per minute BP sitting:   143 / 63  (left arm)  Vitals Entered By: Vikki Ports (April 22, 2010 9:39 AM)  Physical  Exam  General:  Pt is alert and oriented, in no acute distress, elderly gentleman. HEENT: normal Neck: normal carotid upstrokes without bruits, JVP normal Lungs: CTA CV: RRR without grade 2/6 systolic ejection murmur, best heard at the right upper sternal border Abd: soft, NT, positive BS, no bruit, no organomegaly,abdominal aorta is easily palpable and enlarged. Ext: no clubbing, cyanosis. peripheral pulses 2+ and equal. trace pretibial edema bilaterally Skin: warm and dry without rash    EKG  Procedure date:  04/22/2010  Findings:      Sinus brady 54 bpm, otherwise within normal limits.  Impression & Recommendations:  Problem # 1:  ABDOMINAL AORTIC ANEURYSM (ICD-441.4) Duplex results reviewed from today's study and AAA remains 5.1x4.9 cm max diameter. Continue to monitor with every 6 months ultrasound studies. Discussed symptoms of AAA rupture in detail with pt and he understands that abdominal pain is a medical emergency that should prompt an immediate EMS call. Continue risk reduction measures as below. He is not a beta blocker candidate because his resting HR is 54 bpm.  Problem # 2:  HYPERLIPIDEMIA (ICD-272.4) At goal on statin Rx.  His updated medication list for this problem includes:    Vytorin 10-40 Mg Tabs (Ezetimibe-simvastatin) .Marland Kitchen... Take one tab p.o. at bedtime  CHOL: 103 (03/31/2010)   LDL: 46 (03/31/2010)   HDL: 44.70 (03/31/2010)  TG: 63.0 (03/31/2010)  Problem # 3:  HYPERTENSION (ICD-401.9) Continue current Rx.  His updated medication list for this problem includes:    Terazosin Hcl 5 Mg Caps (Terazosin hcl) ..... Once daily    Aspirin 325 Mg Tabs (Aspirin) .Marland Kitchen... 1/2 once daily    Lasix 80 Mg Tabs (Furosemide) .Marland Kitchen..Marland Kitchen Two times a day  BP today: 143/63 Prior BP: 130/68 (04/04/2010)  Labs Reviewed: K+: 4.8 (03/31/2010) Creat: : 1.4 (03/31/2010)   Chol: 103 (03/31/2010)   HDL: 44.70 (03/31/2010)   LDL: 46 (03/31/2010)   TG: 63.0 (03/31/2010)  Orders: EKG  w/ Interpretation (93000)  Problem # 4:  CORONARY ARTERY DISEASE (ICD-414.00) Stable without angina.  His updated medication list for this problem includes:    Aspirin 325 Mg Tabs (Aspirin) .Marland Kitchen... 1/2 once daily    Nitroglycerin 0.4 Mg Subl (Nitroglycerin) .Marland Kitchen... As needed  Patient Instructions: 1)  Your physician recommends that you continue on your current medications as directed. Please refer to the Current Medication list given to you today. 2)  Your physician wants you to follow-up in:  6 MONTHS.  You will receive a reminder letter in the mail two months in advance. If you don't receive a letter, please call our office to schedule the follow-up appointment. 3)  Your physician has requested that you have an abdominal aorta duplex in 6 MONTHS. During this test, an ultrasound is used to evaluate the aorta. Allow 30 minutes for this exam. Do not eat after midnight the day before and avoid carbonated beverages. There are no restrictions or special instructions.

## 2010-07-28 NOTE — Assessment & Plan Note (Signed)
Summary: f36m   Visit Type:  6 months follow yp Primary Provider:  Tresa Garter MD  CC:  No complains.  History of Present Illness: this is an 77 year old gentleman who presents today for followup of coronary artery disease and AAA. He underwent remote multivessel coronary bypass surgery in 1981 and has had multiple subsequent PCI procedures. He has been able to remain active and is maintaining a garden this spring. He denies chest pain, dyspnea, or lightheadedness. He admits to chronic leg edema but this is been under good control on twice daily furosemide. He denies abdominal pain.  Current Medications (verified): 1)  Terazosin Hcl 5 Mg Caps (Terazosin Hcl) .... Once Daily 2)  Aspirin 325 Mg  Tabs (Aspirin) .... 1/2 Once Daily 3)  Lasix 80 Mg  Tabs (Furosemide) .... Two Times A Day 4)  Nitroglycerin 0.4 Mg  Subl (Nitroglycerin) .... As Needed 5)  Colcrys 0.6 Mg Tabs (Colchicine) .Marland Kitchen.. 1 By Mouth Once Daily or Up To Three Times A Day As Needed Gout 6)  Allopurinol 100 Mg Tabs (Allopurinol) .... 2 By Mouth Daily 7)  Vytorin 10-40 Mg Tabs (Ezetimibe-Simvastatin) .... Take One Tab P.o. At Bedtime 8)  Promethazine Hcl 25 Mg  Tabs (Promethazine Hcl) .Marland Kitchen.. 1 By Mouth Three Times A Day As Needed Nausea 9)  Lumigan 0.03 % Soln (Bimatoprost) .... One Drop Each Eye At Night 10)  Camagam Eye Drops .... One Drop Each Eye Two Times A Day  Allergies: 1)  ! Sulfacetamide Sodium-Sulfur (Sulfacetamide Sodium-Sulfur)  Past History:  Past medical history reviewed for relevance to current acute and chronic problems.  Past Medical History: Reviewed history from 03/25/2009 and no changes required. Coronary artery disease s/p CABG and multiple PCI's Hyperlipidemia Hypertension AAA - 5 cm on 2010 assessment Gout 2009 Renal insufficiency  Review of Systems       Negative except as per HPI   Vital Signs:  Patient profile:   77 year old male Height:      70 inches Weight:      173  pounds Pulse rate:   55 / minute Pulse rhythm:   regular Resp:     18 per minute BP sitting:   140 / 76  (left arm) Cuff size:   small  Vitals Entered By: Vikki Ports (September 30, 2009 3:14 PM)  Physical Exam  General:  Pt is alert and oriented, elderly gentleman, in no acute distress. HEENT: normal Neck: normal carotid upstrokes with bilateral bruits, JVP normal Lungs: CTA CV: RRR with 2/6 systolic ejection murmur or gallop Abd: soft, NT, positive BS, no bruit, no organomegaly, enlarged, pulsatile abdominal aorta is easily palpable. Ext: no clubbing, cyanosis, or edema. peripheral pulses 2+ and equal Skin: warm and dry without rash    EKG  Procedure date:  09/30/2009  Findings:      Sinus bradycardia, left atrial enlargement, nonspecific ST and T wave abnormality.  Impression & Recommendations:  Problem # 1:  CORONARY ARTERY DISEASE (ICD-414.00) The patient is stable without angina. Will continue his current medical program which includes antiplatelet therapy with aspirin and treatment of his hypercholesterolemia with Vytorin. Would normally have him on a beta blocker in the setting of CAD and abdominal aortic aneurysm, but his resting bradycardia precludes this. His updated medication list for this problem includes:    Aspirin 325 Mg Tabs (Aspirin) .Marland Kitchen... 1/2 once daily    Nitroglycerin 0.4 Mg Subl (Nitroglycerin) .Marland Kitchen... As needed  Problem # 2:  ABDOMINAL AORTIC  ANEURYSM (ICD-441.4) Most recent abdominal ultrasound was reviewed and demonstrates a stable abdominal aortic aneurysm with maximum dimension 5.1 cm. He is due for his six-month followup ultrasound in October.  Problem # 3:  HYPERLIPIDEMIA (ICD-272.4) Lipids excellent with LDL less than 70. Continue Vytorin. His updated medication list for this problem includes:    Vytorin 10-40 Mg Tabs (Ezetimibe-simvastatin) .Marland Kitchen... Take one tab p.o. at bedtime  CHOL: 126 (06/29/2009)   LDL: 64 (06/29/2009)   HDL: 44.20  (06/29/2009)   TG: 91.0 (06/29/2009)  Patient Instructions: 1)  Your physician recommends that you schedule a follow-up appointment in: 6 MONTHS--PT HAS TRIPLE A DUPLEX SCHED FOR SEPT '11--COULD WE MOVE THIS TO THE DATE HE COMES IN FOR FOLLOW UP APPOINT IN OCT

## 2010-07-28 NOTE — Progress Notes (Signed)
Summary: Colcrys  Phone Note Refill Request Message from:  Fax from Pharmacy on September 27, 2009 1:47 PM  Colcrys refill request  Initial call taken by: Lucious Groves,  September 27, 2009 1:47 PM  Follow-up for Phone Call        OK to ref x 2 Follow-up by: Tresa Garter MD,  September 27, 2009 5:07 PM  Additional Follow-up for Phone Call Additional follow up Details #1::        Colchicine is on med list, ok to use same directions and qty?  Additional Follow-up by: Lamar Sprinkles, CMA,  September 27, 2009 6:40 PM    Additional Follow-up for Phone Call Additional follow up Details #2::    Yes please Follow-up by: Tresa Garter MD,  September 28, 2009 7:25 AM  New/Updated Medications: COLCRYS 0.6 MG TABS (COLCHICINE) 1 by mouth once daily or up to three times a day as needed gout Prescriptions: COLCRYS 0.6 MG TABS (COLCHICINE) 1 by mouth once daily or up to three times a day as needed gout  #90 x 5   Entered by:   Lamar Sprinkles, CMA   Authorized by:   Tresa Garter MD   Signed by:   Lamar Sprinkles, CMA on 09/28/2009   Method used:   Electronically to        [facility]* (retail)       [address] [place], [place]  [address]       Ph: [phone]       Fax: [phone]   RxID:   [id number]

## 2010-07-28 NOTE — Assessment & Plan Note (Signed)
Summary: 3 MTH FU  $50--STC   Vital Signs:  Patient profile:   77 year old male Weight:      175 pounds Temp:     96.8 degrees F oral Pulse rate:   56 / minute BP sitting:   122 / 54  Vitals Entered By: Tora Perches (July 08, 2009 8:31 AM) CC: f/u Is Patient Diabetic? No   Primary Care Provider:  Tresa Garter MD  CC:  f/u.  History of Present Illness: The patient presents for a follow up of hypertension, gout, hyperlipidemia.   Preventive Screening-Counseling & Management  Alcohol-Tobacco     Smoking Status: quit  Current Medications (verified): 1)  Terazosin Hcl 5 Mg Caps (Terazosin Hcl) .... Once Daily 2)  Aspirin 325 Mg  Tabs (Aspirin) .... 1/2 Once Daily 3)  Lasix 80 Mg  Tabs (Furosemide) .... Two Times A Day 4)  Nitroglycerin 0.4 Mg  Subl (Nitroglycerin) .... As Needed 5)  Colchicine 0.6 Mg Tabs (Colchicine) .Marland Kitchen.. 1 By Mouth Once Daily or Up To Three Times A Day As Needed Gout 6)  Allopurinol 100 Mg Tabs (Allopurinol) .... 2 By Mouth Daily 7)  Vytorin 10-40 Mg Tabs (Ezetimibe-Simvastatin) .... Take One Tab P.o. At Bedtime 8)  Promethazine Hcl 25 Mg  Tabs (Promethazine Hcl) .Marland Kitchen.. 1 By Mouth Three Times A Day As Needed Nausea 9)  Lumigan 0.03 % Soln (Bimatoprost) .... One Drop Each Eye At Night 10)  Camagam Eye Drops .... One Drop Each Eye Two Times A Day  Allergies: 1)  ! Sulfacetamide Sodium-Sulfur (Sulfacetamide Sodium-Sulfur)  Past History:  Past Medical History: Last updated: 03/25/2009 Coronary artery disease s/p CABG and multiple PCI's Hyperlipidemia Hypertension AAA - 5 cm on 2010 assessment Gout 2009 Renal insufficiency  Social History: Last updated: 03/29/2009 Retired Married x 68 years in 2010 Former Smoker Alcohol use-no  Physical Exam  General:  Pt is alert and oriented, in no acute distress, elderly gentleman. HEENT: normal Neck: normal carotid upstrokes without bruits, JVP normal Lungs: CTA CV: RRR without grade 2/6  systolic ejection murmur, best heard at the right upper sternal border Abd: soft, NT, positive BS, no bruit, no organomegaly,abdominal aorta is easily palpable and enlarged. Ext: no clubbing, cyanosis. peripheral pulses 2+ and equal. trace pretibial edema bilaterally Skin: warm and dry without rash  Mouth:  Oral mucosa and oropharynx without lesions or exudates.  Teeth in good repair. Lungs:  Normal respiratory effort, chest expands symmetrically. Lungs are clear to auscultation, no crackles or wheezes. Heart:  Normal rate and regular rhythm. S1 and S2 normal without gallop. 2/.6 murmur Abdomen:  Bowel sounds positive,abdomen soft and non-tender without masses, organomegaly or hernias noted. Msk:  WNL Neurologic:  alert & oriented X3.   Skin:  WNL Psych:  Oriented X3.     Impression & Recommendations:  Problem # 1:  GOUT, UNSPECIFIED (ICD-274.9) Assessment Improved  His updated medication list for this problem includes:    Colchicine 0.6 Mg Tabs (Colchicine) .Marland Kitchen... 1 by mouth once daily or up to three times a day as needed gout    Allopurinol 100 Mg Tabs (Allopurinol) .Marland Kitchen... 2 by mouth daily  Problem # 2:  ABDOMINAL AORTIC ANEURYSM (ICD-441.4) Assessment: Comment Only  Problem # 3:  CORONARY ARTERY DISEASE (ICD-414.00) Assessment: Unchanged  His updated medication list for this problem includes:    Terazosin Hcl 5 Mg Caps (Terazosin hcl) ..... Once daily    Aspirin 325 Mg Tabs (Aspirin) .Marland Kitchen... 1/2 once daily  Lasix 80 Mg Tabs (Furosemide) .Marland Kitchen..Marland Kitchen Two times a day    Nitroglycerin 0.4 Mg Subl (Nitroglycerin) .Marland Kitchen... As needed  Problem # 4:  HYPERLIPIDEMIA (ICD-272.4) Assessment: Unchanged  His updated medication list for this problem includes:    Vytorin 10-40 Mg Tabs (Ezetimibe-simvastatin) .Marland Kitchen... Take one tab p.o. at bedtime  Problem # 5:  HYPERTENSION (ICD-401.9) Assessment: Comment Only  His updated medication list for this problem includes:    Terazosin Hcl 5 Mg Caps  (Terazosin hcl) ..... Once daily    Lasix 80 Mg Tabs (Furosemide) .Marland Kitchen..Marland Kitchen Two times a day  Complete Medication List: 1)  Terazosin Hcl 5 Mg Caps (Terazosin hcl) .... Once daily 2)  Aspirin 325 Mg Tabs (Aspirin) .... 1/2 once daily 3)  Lasix 80 Mg Tabs (Furosemide) .... Two times a day 4)  Nitroglycerin 0.4 Mg Subl (Nitroglycerin) .... As needed 5)  Colchicine 0.6 Mg Tabs (Colchicine) .Marland Kitchen.. 1 by mouth once daily or up to three times a day as needed gout 6)  Allopurinol 100 Mg Tabs (Allopurinol) .... 2 by mouth daily 7)  Vytorin 10-40 Mg Tabs (Ezetimibe-simvastatin) .... Take one tab p.o. at bedtime 8)  Promethazine Hcl 25 Mg Tabs (Promethazine hcl) .Marland Kitchen.. 1 by mouth three times a day as needed nausea 9)  Lumigan 0.03 % Soln (Bimatoprost) .... One drop each eye at night 10)  Camagam Eye Drops  .... One drop each eye two times a day  Patient Instructions: 1)  Please schedule a follow-up appointment in 2 months. 2)  Try to eat more raw plant food, fresh and dry fruit, raw almonds, leafy vegetables, whole foods and less red meat, less animal fat. Poultry and fish is better for you than pork and beef. Avoid processed foods (canned soups, hot dogs, sausage, bacon , frozen dinners). Avoid corn syrup, high fructose syrup or aspartam and Splenda  containing drinks. Honey, Agave and Stevia are better sweeteners. Make your own  dressing with olive oil, wine vinegar, lemon juce, garlic etc. for your salads.

## 2010-07-28 NOTE — Assessment & Plan Note (Signed)
Summary: 2 MO ROV /NWS  #   Vital Signs:  Patient profile:   77 year old male Weight:      171 pounds Temp:     97.5 degrees F oral Pulse rate:   51 / minute BP sitting:   126 / 54  (left arm)  Vitals Entered By: Tora Perches (September 09, 2009 11:14 AM) CC: f/u Is Patient Diabetic? No   Primary Care Provider:  Tresa Garter MD  CC:  f/u.  History of Present Illness: The patient presents for a follow up of hypertension, BPH, hyperlipidemia, CAD   Preventive Screening-Counseling & Management  Alcohol-Tobacco     Smoking Status: quit  Current Medications (verified): 1)  Terazosin Hcl 5 Mg Caps (Terazosin Hcl) .... Once Daily 2)  Aspirin 325 Mg  Tabs (Aspirin) .... 1/2 Once Daily 3)  Lasix 80 Mg  Tabs (Furosemide) .... Two Times A Day 4)  Nitroglycerin 0.4 Mg  Subl (Nitroglycerin) .... As Needed 5)  Colchicine 0.6 Mg Tabs (Colchicine) .Marland Kitchen.. 1 By Mouth Once Daily or Up To Three Times A Day As Needed Gout 6)  Allopurinol 100 Mg Tabs (Allopurinol) .... 2 By Mouth Daily 7)  Vytorin 10-40 Mg Tabs (Ezetimibe-Simvastatin) .... Take One Tab P.o. At Bedtime 8)  Promethazine Hcl 25 Mg  Tabs (Promethazine Hcl) .Marland Kitchen.. 1 By Mouth Three Times A Day As Needed Nausea 9)  Lumigan 0.03 % Soln (Bimatoprost) .... One Drop Each Eye At Night 10)  Camagam Eye Drops .... One Drop Each Eye Two Times A Day  Allergies: 1)  ! Sulfacetamide Sodium-Sulfur (Sulfacetamide Sodium-Sulfur)  Past History:  Past Medical History: Last updated: 03/25/2009 Coronary artery disease s/p CABG and multiple PCI's Hyperlipidemia Hypertension AAA - 5 cm on 2010 assessment Gout 2009 Renal insufficiency  Social History: Last updated: 03/29/2009 Retired Married x 68 years in 2010 Former Smoker Alcohol use-no  Physical Exam  General:  Pt is alert and oriented, in no acute distress, elderly gentleman. HEENT: normal Neck: normal carotid upstrokes without bruits, JVP normal Lungs: CTA CV: RRR without  grade 2/6 systolic ejection murmur, best heard at the right upper sternal border Abd: soft, NT, positive BS, no bruit, no organomegaly,abdominal aorta is easily palpable and enlarged. Ext: no clubbing, cyanosis. peripheral pulses 2+ and equal. trace pretibial edema bilaterally Skin: warm and dry without rash  Psych:  Cognition and judgment appear intact. Alert and cooperative with normal attention span and concentration. No apparent delusions, illusions, hallucinations   Impression & Recommendations:  Problem # 1:  HYPERTENSION (ICD-401.9) Assessment Unchanged  His updated medication list for this problem includes:    Terazosin Hcl 5 Mg Caps (Terazosin hcl) ..... Once daily    Lasix 80 Mg Tabs (Furosemide) .Marland Kitchen..Marland Kitchen Two times a day  BP today: 126/54 Prior BP: 122/54 (07/08/2009)  Labs Reviewed: K+: 4.0 (06/29/2009) Creat: : 1.6 (06/29/2009)   Chol: 126 (06/29/2009)   HDL: 44.20 (06/29/2009)   LDL: 64 (06/29/2009)   TG: 91.0 (06/29/2009)  Orders: TLB-BMP (Basic Metabolic Panel-BMET) (80048-METABOL)  Problem # 2:  HYPERLIPIDEMIA (ICD-272.4) Assessment: Unchanged  His updated medication list for this problem includes:    Vytorin 10-40 Mg Tabs (Ezetimibe-simvastatin) .Marland Kitchen... Take one tab p.o. at bedtime  Problem # 3:  CORONARY ARTERY DISEASE (ICD-414.00) Assessment: Unchanged  His updated medication list for this problem includes:    Terazosin Hcl 5 Mg Caps (Terazosin hcl) ..... Once daily    Aspirin 325 Mg Tabs (Aspirin) .Marland Kitchen... 1/2 once daily  Lasix 80 Mg Tabs (Furosemide) .Marland Kitchen..Marland Kitchen Two times a day    Nitroglycerin 0.4 Mg Subl (Nitroglycerin) .Marland Kitchen... As needed  Problem # 4:  RENAL INSUFFICIENCY (ICD-588.9) Assessment: Unchanged bmet  Complete Medication List: 1)  Terazosin Hcl 5 Mg Caps (Terazosin hcl) .... Once daily 2)  Aspirin 325 Mg Tabs (Aspirin) .... 1/2 once daily 3)  Lasix 80 Mg Tabs (Furosemide) .... Two times a day 4)  Nitroglycerin 0.4 Mg Subl (Nitroglycerin) .... As  needed 5)  Colchicine 0.6 Mg Tabs (Colchicine) .Marland Kitchen.. 1 by mouth once daily or up to three times a day as needed gout 6)  Allopurinol 100 Mg Tabs (Allopurinol) .... 2 by mouth daily 7)  Vytorin 10-40 Mg Tabs (Ezetimibe-simvastatin) .... Take one tab p.o. at bedtime 8)  Promethazine Hcl 25 Mg Tabs (Promethazine hcl) .Marland Kitchen.. 1 by mouth three times a day as needed nausea 9)  Lumigan 0.03 % Soln (Bimatoprost) .... One drop each eye at night 10)  Camagam Eye Drops  .... One drop each eye two times a day  Patient Instructions: 1)  Please schedule a follow-up appointment in 3 months. 2)  BMP prior to visit, ICD-9: 3)  Hepatic Panel prior to visit, ICD-9: 4)  Lipid Panel prior to visit, ICD-9:401.1  272.0

## 2010-07-28 NOTE — Miscellaneous (Signed)
Summary: Orders Update  Clinical Lists Changes  Orders: Added new Test order of Abdominal Aorta Duplex (Abd Aorta Duplex) - Signed 

## 2010-07-28 NOTE — Letter (Signed)
Summary: [facility]   Imported By: Sherian Rein 06/13/2010 12:36:25  _____________________________________________________________________  External Attachment:    Type:   Image     Comment:   External Document

## 2010-07-28 NOTE — Assessment & Plan Note (Signed)
Summary: 3 MO ROV /NWS  #   Vital Signs:  Patient profile:   77 year old male Height:      70 inches Weight:      165.75 pounds BMI:     23.87 O2 Sat:      98 % on Room air Temp:     97.2 degrees F oral Pulse rate:   59 / minute BP sitting:   140 / 60  (left arm) Cuff size:   regular  Vitals Entered By: Lucious Groves (December 10, 2009 8:54 AM)  O2 Flow:  Room air CC: 3 mo rtn ov./kb Is Patient Diabetic? No Pain Assessment Patient in pain? no      Comments Patient notes that he needs refill of Vytorin and is not taking Colcrys./kb   Primary Care Provider:  Tresa Garter MD  CC:  3 mo rtn ov./kb.  History of Present Illness: The patient presents for a follow up of hypertension, CAD, hyperlipidemia   Current Medications (verified): 1)  Terazosin Hcl 5 Mg Caps (Terazosin Hcl) .... Once Daily 2)  Aspirin 325 Mg  Tabs (Aspirin) .... 1/2 Once Daily 3)  Lasix 80 Mg  Tabs (Furosemide) .... Two Times A Day 4)  Nitroglycerin 0.4 Mg  Subl (Nitroglycerin) .... As Needed 5)  Colcrys 0.6 Mg Tabs (Colchicine) .Marland Kitchen.. 1 By Mouth Once Daily or Up To Three Times A Day As Needed Gout 6)  Allopurinol 100 Mg Tabs (Allopurinol) .... 2 By Mouth Daily 7)  Vytorin 10-40 Mg Tabs (Ezetimibe-Simvastatin) .... Take One Tab P.o. At Bedtime 8)  Promethazine Hcl 25 Mg  Tabs (Promethazine Hcl) .Marland Kitchen.. 1 By Mouth Three Times A Day As Needed Nausea 9)  Lumigan 0.03 % Soln (Bimatoprost) .... One Drop Each Eye At Night 10)  Camagam Eye Drops .... One Drop Each Eye Two Times A Day  Allergies (verified): 1)  ! Sulfacetamide Sodium-Sulfur (Sulfacetamide Sodium-Sulfur)  Past History:  Past Medical History: Last updated: 03/25/2009 Coronary artery disease s/p CABG and multiple PCI's Hyperlipidemia Hypertension AAA - 5 cm on 2010 assessment Gout 2009 Renal insufficiency  Social History: Last updated: 03/29/2009 Retired Married x 68 years in 2010 Former Smoker Alcohol use-no  Review of  Systems  The patient denies chest pain, syncope, dyspnea on exertion, and prolonged cough.    Physical Exam  General:  Pt is alert and oriented, in no acute distress, elderly gentleman. HEENT: normal Neck: normal carotid upstrokes without bruits, JVP normal Lungs: CTA CV: RRR without grade 2/6 systolic ejection murmur, best heard at the right upper sternal border Abd: soft, NT, positive BS, no bruit, no organomegaly,abdominal aorta is easily palpable and enlarged. Ext: no clubbing, cyanosis. peripheral pulses 2+ and equal. trace pretibial edema bilaterally Skin: warm and dry without rash  Mouth:  Oral mucosa and oropharynx without lesions or exudates.  Teeth in good repair. Lungs:  Normal respiratory effort, chest expands symmetrically. Lungs are clear to auscultation, no crackles or wheezes. Heart:  Normal rate and regular rhythm. S1 and S2 normal without gallop. 2/.6 murmur Abdomen:  Bowel sounds positive,abdomen soft and non-tender without masses, organomegaly or hernias noted. Msk:  WNL Neurologic:  alert & oriented X3.   Skin:  WNL Psych:  Cognition and judgment appear intact. Alert and cooperative with normal attention span and concentration. No apparent delusions, illusions, hallucinations   Impression & Recommendations:  Problem # 1:  GOUT, UNSPECIFIED (ICD-274.9) Assessment Improved  His updated medication list for this problem includes:  Colcrys 0.6 Mg Tabs (Colchicine) .Marland Kitchen... 1 by mouth once daily or up to three times a day as needed gout    Allopurinol 100 Mg Tabs (Allopurinol) .Marland Kitchen... 2 by mouth daily  Problem # 2:  HYPERLIPIDEMIA (ICD-272.4) Assessment: Improved  His updated medication list for this problem includes:    Vytorin 10-40 Mg Tabs (Ezetimibe-simvastatin) .Marland Kitchen... Take one tab p.o. at bedtime  Problem # 3:  CORONARY ARTERY DISEASE (ICD-414.00) Assessment: Unchanged  His updated medication list for this problem includes:    Terazosin Hcl 5 Mg Caps  (Terazosin hcl) ..... Once daily    Aspirin 325 Mg Tabs (Aspirin) .Marland Kitchen... 1/2 once daily    Lasix 80 Mg Tabs (Furosemide) .Marland Kitchen..Marland Kitchen Two times a day    Nitroglycerin 0.4 Mg Subl (Nitroglycerin) .Marland Kitchen... As needed  Problem # 4:  HYPERTENSION (ICD-401.9) Assessment: Improved  His updated medication list for this problem includes:    Terazosin Hcl 5 Mg Caps (Terazosin hcl) ..... Once daily    Lasix 80 Mg Tabs (Furosemide) .Marland Kitchen..Marland Kitchen Two times a day  Problem # 5:  ABDOMINAL AORTIC ANEURYSM (ICD-441.4) [place] q 6 months   Complete Medication List: 1)  Terazosin Hcl 5 Mg Caps (Terazosin hcl) .... Once daily 2)  Aspirin 325 Mg Tabs (Aspirin) .... 1/2 once daily 3)  Lasix 80 Mg Tabs (Furosemide) .... Two times a day 4)  Nitroglycerin 0.4 Mg Subl (Nitroglycerin) .... As needed 5)  Colcrys 0.6 Mg Tabs (Colchicine) .Marland Kitchen.. 1 by mouth once daily or up to three times a day as needed gout 6)  Allopurinol 100 Mg Tabs (Allopurinol) .... 2 by mouth daily 7)  Vytorin 10-40 Mg Tabs (Ezetimibe-simvastatin) .... Take one tab p.o. at bedtime 8)  Promethazine Hcl 25 Mg Tabs (Promethazine hcl) .Marland Kitchen.. 1 by mouth three times a day as needed nausea 9)  Lumigan 0.03 % Soln (Bimatoprost) .... One drop each eye at night 10)  Camagam Eye Drops  .... One drop each eye two times a day  Patient Instructions: 1)  Please schedule a follow-up appointment in 4 months well w/labs.

## 2010-08-02 ENCOUNTER — Ambulatory Visit (INDEPENDENT_AMBULATORY_CARE_PROVIDER_SITE_OTHER): Payer: Medicare Other | Admitting: Internal Medicine

## 2010-08-02 ENCOUNTER — Encounter: Payer: Self-pay | Admitting: Internal Medicine

## 2010-08-02 DIAGNOSIS — D485 Neoplasm of uncertain behavior of skin: Secondary | ICD-10-CM

## 2010-08-02 DIAGNOSIS — L570 Actinic keratosis: Secondary | ICD-10-CM | POA: Insufficient documentation

## 2010-08-02 DIAGNOSIS — E785 Hyperlipidemia, unspecified: Secondary | ICD-10-CM

## 2010-08-02 DIAGNOSIS — N259 Disorder resulting from impaired renal tubular function, unspecified: Secondary | ICD-10-CM

## 2010-08-02 DIAGNOSIS — I10 Essential (primary) hypertension: Secondary | ICD-10-CM

## 2010-08-11 NOTE — Assessment & Plan Note (Signed)
Summary: 4 MTH FU STC   Vital Signs:  Patient profile:   77 year old male Weight:      166 pounds BMI:     23.90 O2 Sat:      98 % Temp:     97.7 degrees F oral Pulse rate:   55 / minute BP sitting:   140 / 58  (left arm) Cuff size:   regular  Vitals Entered By: Lamar Sprinkles, CMA (August 02, 2010 10:03 AM) CC: 4 mth f/u/SD   Primary Care Provider:  Tresa Garter MD  CC:  4 mth f/u/SD.  History of Present Illness: The patient presents for a follow up of hypertension, gout, hyperlipidemia and CAD  Current Medications (verified): 1)  Terazosin Hcl 5 Mg Caps (Terazosin Hcl) .... Once Daily 2)  Aspirin 325 Mg  Tabs (Aspirin) .... 1/2 Once Daily 3)  Lasix 80 Mg  Tabs (Furosemide) .... Two Times A Day 4)  Nitroglycerin 0.4 Mg  Subl (Nitroglycerin) .... As Needed 5)  Allopurinol 100 Mg Tabs (Allopurinol) .... 2 By Mouth Daily 6)  Vytorin 10-40 Mg Tabs (Ezetimibe-Simvastatin) .... Take One Tab P.o. At Bedtime 7)  Promethazine Hcl 25 Mg  Tabs (Promethazine Hcl) .Marland Kitchen.. 1 By Mouth Three Times A Day As Needed Nausea 8)  Lumigan 0.03 % Soln (Bimatoprost) .... One Drop Each Eye At Night 9)  Camagam Eye Drops .... One Drop Each Eye Two Times A Day  Allergies (verified): 1)  ! Sulfacetamide Sodium-Sulfur (Sulfacetamide Sodium-Sulfur)  Past History:  Past Medical History: Last updated: 04/22/2010 Coronary artery disease s/p CABG and multiple PCI's Hyperlipidemia Hypertension AAA - 5 cm on 2011 assessment Gout 2009 Renal insufficiency Elev PSA Dr Earlene Plater  Social History: Last updated: 03/29/2009 Retired Married x 68 years in 2010 Former Smoker Alcohol use-no  Review of Systems  The patient denies fever, chest pain, dyspnea on exertion, peripheral edema, abdominal pain, melena, hematochezia, and severe indigestion/heartburn.    Physical Exam  General:  Pt is alert and oriented, in no acute distress, elderly gentleman. HEENT: normal Neck: normal carotid upstrokes  without bruits, JVP normal Lungs: CTA CV: RRR without grade 2/6 systolic ejection murmur, best heard at the right upper sternal border Abd: soft, NT, positive BS, no bruit, no organomegaly,abdominal aorta is easily palpable and enlarged. Ext: no clubbing, cyanosis. peripheral pulses 2+ and equal. trace pretibial edema bilaterally Skin: warm and dry without rash  Ears:  hearing aids B, hard hearing Mouth:  Oral mucosa and oropharynx without lesions or exudates.  Teeth in good repair. Lungs:  Normal respiratory effort, chest expands symmetrically. Lungs are clear to auscultation, no crackles or wheezes. Heart:  Normal rate and regular rhythm. S1 and S2 normal without gallop. 2/.6 murmur Abdomen:  Bowel sounds positive,abdomen soft and non-tender without masses, organomegaly or hernias noted. Msk:  WNL Neurologic:  alert & oriented X3.   Skin:  AKs, SKs Growth on R temple Psych:  Cognition and judgment appear intact. Alert and cooperative with normal attention span and concentration. No apparent delusions, illusions, hallucinations   Impression & Recommendations:  Problem # 1:  RENAL INSUFFICIENCY (ICD-588.9) Assessment Deteriorated Drink more water Recheck labs in 2 months  The labs were reviewed with the patient.   Problem # 2:  ANEMIA, NORMOCYTIC (ICD-285.9) Assessment: Unchanged  Hgb: 12.3 (07/26/2010)   Hct: 37.1 (07/26/2010)   Platelets: 171.0 (07/26/2010) RBC: 4.13 (07/26/2010)   RDW: 14.7 (07/26/2010)   WBC: 7.4 (07/26/2010) MCV: 89.9 (07/26/2010)  MCHC: 33.1 (07/26/2010) TSH: 1.04 (03/31/2010)  Problem # 3:  HYPERTENSION (ICD-401.9) Assessment: Improved  His updated medication list for this problem includes:    Terazosin Hcl 5 Mg Caps (Terazosin hcl) ..... Once daily    Lasix 80 Mg Tabs (Furosemide) .Marland Kitchen..Marland Kitchen Two times a day  BP today: 140/58 Prior BP: 143/63 (04/22/2010)  Labs Reviewed: K+: 4.3 (07/26/2010) Creat: : 1.7 (07/26/2010)   Chol: 103 (03/31/2010)   HDL:  44.70 (03/31/2010)   LDL: 46 (03/31/2010)   TG: 63.0 (03/31/2010)  Problem # 4:  HYPERLIPIDEMIA (ICD-272.4) Assessment: Unchanged  His updated medication list for this problem includes:    Vytorin 10-40 Mg Tabs (Ezetimibe-simvastatin) .Marland Kitchen... Take one tab p.o. at bedtime  Problem # 5:  CORONARY ARTERY DISEASE (ICD-414.00) Assessment: Unchanged  His updated medication list for this problem includes:    Terazosin Hcl 5 Mg Caps (Terazosin hcl) ..... Once daily    Aspirin 325 Mg Tabs (Aspirin) .Marland Kitchen... 1/2 once daily    Lasix 80 Mg Tabs (Furosemide) .Marland Kitchen..Marland Kitchen Two times a day    Nitroglycerin 0.4 Mg Subl (Nitroglycerin) .Marland Kitchen... 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 Once daily 2)  Aspirin 325 Mg Tabs (Aspirin) .... 1/2 once daily 3)  Lasix 80 Mg Tabs (Furosemide) .... Two times a day 4)  Nitroglycerin 0.4 Mg Subl (Nitroglycerin) .... As needed 5)  Allopurinol 100 Mg Tabs (Allopurinol) .... 2 by mouth daily 6)  Vytorin 10-40 Mg Tabs (Ezetimibe-simvastatin) .... Take one tab p.o. at bedtime 7)  Promethazine Hcl 25 Mg Tabs (Promethazine hcl) .Marland Kitchen.. 1 by mouth three times a day as needed nausea 8)  Lumigan 0.03 % Soln (Bimatoprost) .... One drop each eye at night 9)  Camagam Eye Drops  .... One drop each eye two times a day  Patient Instructions: 1)  Please schedule a follow-up appointment in 1-2 months for skin bx with me. 2)  BMP prior to visit, ICD-9:401.1   Orders Added: 1)  Est. Patient Level IV [04540]

## 2010-09-20 ENCOUNTER — Other Ambulatory Visit (INDEPENDENT_AMBULATORY_CARE_PROVIDER_SITE_OTHER): Payer: Medicare Other

## 2010-09-20 DIAGNOSIS — I10 Essential (primary) hypertension: Secondary | ICD-10-CM

## 2010-09-20 LAB — BASIC METABOLIC PANEL
BUN: 32 mg/dL — ABNORMAL HIGH (ref 6–23)
Creatinine, Ser: 1.5 mg/dL (ref 0.4–1.5)
GFR: 48.39 mL/min — ABNORMAL LOW (ref >60.00)
Potassium: 4 mEq/L (ref 3.5–5.1)

## 2010-09-27 ENCOUNTER — Encounter: Payer: Self-pay | Admitting: Internal Medicine

## 2010-09-27 ENCOUNTER — Ambulatory Visit (INDEPENDENT_AMBULATORY_CARE_PROVIDER_SITE_OTHER): Payer: Medicare Other | Admitting: Internal Medicine

## 2010-09-27 VITALS — BP 150/70 | HR 64 | Temp 97.6°F | Resp 16 | Ht 70.5 in | Wt 167.0 lb

## 2010-09-27 DIAGNOSIS — L821 Other seborrheic keratosis: Secondary | ICD-10-CM

## 2010-09-27 DIAGNOSIS — C44319 Basal cell carcinoma of skin of other parts of face: Secondary | ICD-10-CM

## 2010-09-27 DIAGNOSIS — L570 Actinic keratosis: Secondary | ICD-10-CM

## 2010-09-27 DIAGNOSIS — D485 Neoplasm of uncertain behavior of skin: Secondary | ICD-10-CM

## 2010-09-27 DIAGNOSIS — D229 Melanocytic nevi, unspecified: Secondary | ICD-10-CM

## 2010-09-27 NOTE — Progress Notes (Signed)
  Subjective:    Patient ID: Robert Ritter, male    DOB: Dec 31, 1919, 76 y.o.   MRN: [id number]  HPI  He is here for skin Bx x 2 and cryo x 3 on his face  Review of Systems     Objective:   Physical Exam        Assessment & Plan:   Procedure Note :     Procedure :  Skin biopsy   Indication:  Changing mole (s ),  Suspicious lesion(s)   Risks including unsuccessful procedure , bleeding, infection, bruising, scar, a need for another complete procedure and others were explained to the patient in detail as well as the benefits. Informed consent was obtained and signed.   The patient was placed in a decubitus position.  Lesion #1 on   R temple  measuring 11x 8    mm   Skin over lesion #1  was prepped with Betadine and alcohol  and anesthetized with 1.5 cc of 2% lidocaine and epinephrine, using a 25-gauge 1 inch needle.  Shave fragment biopsy with a sterile Dermablade was carried out in the usual fashion. It was attempted to remove with  1 mm free margins using a  Hyfrecator  to destroy the rest of the lesion  left behind and for hemostasis. Band-Aid was applied with antibiotic ointment.    Lesion #2 on  R temple   measuring 11x11  mm   Skin over lesion #2  was prepped with Betadine and alcohol  and anesthetized with 1.5 cc of 2% lidocaine and epinephrine, using a 25-gauge 1 inch needle.  Shave fragment biopsy with a sterile Dermablade was carried out in the usual fashion.   Hyfrecator was used to destroy the rest of the lesion  left behind and for hemostasis. Band-Aid was applied with antibiotic ointment.    Tolerated well. Complications none.      Postprocedure instructions :    A Band-Aid should be  changed twice daily. You can take a shower tomorrow.  Keep the wounds clean. You can wash them with liquid soap and water. Pat dry with gauze or a Kleenex tissue  Before applying antibiotic ointment and a Band-Aid.   You need to report immediately  if fever, chills or any  signs of infection develop.  Procedure Note :       Procedure : Cryosurgery   Indication:   Actinic keratosis(es)   Risks including unsuccessful procedure , bleeding, infection, bruising, scar, a need for a repeat  procedure and others were explained to the patient in detail as well as the benefits. Informed consent was obtained verbally.    3 lesion(s)  on  face  was/were treated with liquid nitrogen on a Q-tip in a usual fasion . Band-Aid was provided and antibiotic ointment was given for a later use.   Tolerated well. Complications none.   Postprocedure instructions :     Keep the wounds clean. You can wash them with liquid soap and water. Pat dry with gauze or a Kleenex tissue  Before applying antibiotic ointment and a Band-Aid.   You need to report immediately  if  any signs of infection develop.      The biopsy results should be available in 1 -2 weeks.

## 2010-09-27 NOTE — Patient Instructions (Signed)
Postprocedure instructions :    A Band-Aid should be  changed twice daily. You can take a shower tomorrow.  Keep the wounds clean. You can wash them with liquid soap and water. Pat dry with gauze or a Kleenex tissue  Before applying antibiotic ointment and a Band-Aid.   You need to report immediately  if fever, chills or any signs of infection develop.    The biopsy results should be available in 1 -2 weeks. 

## 2010-09-30 ENCOUNTER — Telehealth: Payer: Self-pay | Admitting: Internal Medicine

## 2010-09-30 NOTE — Telephone Encounter (Signed)
Robert Ritter , please, inform the patient: [name] showed skin ca, L is ok   Please, keep  next office visit appointment.   Thank you !

## 2010-10-04 NOTE — Telephone Encounter (Signed)
Pt informed

## 2010-10-21 ENCOUNTER — Encounter: Payer: Medicare Other | Admitting: Cardiology

## 2010-11-08 ENCOUNTER — Other Ambulatory Visit: Payer: Self-pay | Admitting: Cardiology

## 2010-11-08 DIAGNOSIS — I714 Abdominal aortic aneurysm, without rupture: Secondary | ICD-10-CM

## 2010-11-08 NOTE — Assessment & Plan Note (Signed)
[facility]                            CARDIOLOGY OFFICE NOTE   NAME:Zietlow, HIEU HERMS                     MRN:          [id number]  DATE:08/01/2007                            DOB:          10-25-1919    Robert Ritter returns for follow-up at [facility] Office on  August 01, 2007.  Mr. Gin is an 77 year old gentleman with  coronary artery disease and remote bypass surgery as well as multiple  PCI procedures.  He also has an abdominal aortic aneurysm that is been  stable over time, just under 5 cm.  He continues to do well from a  symptomatic standpoint.  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  During the winter,  he has been less active.  He specifically denies any exertional  symptoms.  He has had no chest pain, dyspnea, orthopnea, PND or edema.  He is tolerating his medications well.  He has been having difficult  time affording Plavix, so he is only taking this once or twice a week.   CURRENT MEDICATIONS:  Aspirin 162 mg daily, Vytorin 10/40 mg daily,  Lasix 80 mg twice daily as needed, Micardis 40 mg daily, terazosin 5 mg  daily, and Plavix 75 mg approximately once per week.   ALLERGIES:  SULFA.   EXAM:  The patient is alert and oriented.  He is an elderly male in no  acute distress.  Weight 176, blood pressure 120/56, heart rate 50, respiratory rate 16.  HEENT:  Normal.  NECK:  Normal carotid upstrokes without bruits.  Jugular venous pressure  normal.  LUNGS:  Clear bilaterally.  HEART:  Bradycardic and regular will with a 2/6 ejection murmur at the  right upper sternal border.  ABDOMEN:  Soft, nontender no organomegaly.  No bruits.  The aorta is  easily palpable and is pulsatile.  EXTREMITIES:  No clubbing, cyanosis or edema.  Femoral pulses are 2+.  The pedal pulses are intact and equal.  SKIN:  Warm and dry without rash.   EKG shows sinus bradycardia and is  otherwise within normal limits.   ASSESSMENT:  1. Coronary artery disease status post CABG and multiple PCI      procedures.  Mr. Borowski remains stable with no symptoms.      Continue current therapy with aspirin Micardis and Vytorin.  I told      Mr. Hutchinson I thought it was fine to discontinue Plavix at this      point.  He is unable to stay on this regularly due to cost and      really has no way of obtaining the medication.  He has not had a      drug-eluting stent within the last year and so there is no firm      indication to be on Plavix.  2. Dyslipidemia.  Lipids from September 2008 showed an LDL of 52, HDL      31, total cholesterol of 101.  Will repeat lipids at his next      office visit in  6 months.  3. Hypertension.  Blood pressure remains under good control on      multidrug therapy.  4. Abdominal aortic aneurysm.  Most recent study from November 2008      showed stable dimensions of his AAA at 4.7 x 4.7 cm.  He is due for      follow-up in May.   For follow-up I would like to see Mr. Levitt back in 6 months.     Veverly Fells. Excell Seltzer, MD  Electronically Signed    MDC/MedQ  DD: 08/01/2007  DT: 08/02/2007  Job #: [id number]

## 2010-11-08 NOTE — Assessment & Plan Note (Signed)
[facility]                            CARDIOLOGY OFFICE NOTE   NAME:Robert Ritter                     MRN:          [id number]  DATE:02/07/2007                            DOB:          Jul 11, 1919    Robert Ritter returns for followup at the [facility] Cardiology office on  February 07, 2007. He is a long time patient of Dr. Gabriel Rung Frazee's and I am  going to be assuming his care from here forward. Robert Ritter is a  delightful, 77 year old gentleman who has coronary artery disease and  underwent coronary bypass surgery 26 years ago at the [facility]  [place]. He has had percutaneous interventions performed for bypass  graft disease over the past few years. He has been stable for at least  the last 1 year with no angina, dyspnea or other cardiac symptoms. He  also has an abdominal aortic aneurysm that most recently measured 4.8 x  4.7 cm and it has been stable in size.   From a symptomatic standpoint, Robert Ritter continues to do very well.  He mows 5-6 lawns weekly and does regular physical activity. He denies  any exertional symptoms. He has not had light-headedness, syncope,  orthopnea, PND, edema, chest pain or dyspnea.   CURRENT MEDICATIONS:  1. Aspirin 325 mg 1/2 daily.  2. Vytorin 10/40 mg daily.  3. Lasix 80 mg twice daily.  4. Micardis 40 mg daily.  5. Terazosin 5 mg daily.  6. Flomax 0.4 mg daily.  7. Plavix 75 mg daily.  8. Prevacid 30 mg at bedtime as needed.   ALLERGIES:  SULFA.   PHYSICAL EXAMINATION:  GENERAL:  The patient is alert and oriented. He  is in no acute distress. He is an elderly, well-appearing male.  VITAL SIGNS:  Weight is 176 pounds, blood pressure is 122/62, heart rate  is 54, respiratory rate 16.  HEENT:  Normal.  NECK:  Normal carotid upstrokes without bruits.  LUNGS:  Clear to auscultation bilaterally.  HEART:  Regular rate and rhythm with a 2/6 ejection murmur at the right  upper sternal border. There  are no diastolic murmurs or gallops.  ABDOMEN:  Soft, nontender, no organomegaly. No abdominal bruits. The  aorta is easily palpable and is pulsatile. There are no bruits.  EXTREMITIES:  No clubbing, cyanosis or edema. Femoral pulses are 2+  without bruits. Pedal pulses are 2+ and equal bilaterally. There is no  edema.  SKIN:  Warm and dry without rash.   EKG shows sinus bradycardia with question of a septal infarct pattern.   DATA REVIEWED:  Abdominal aortic ultrasound from Nov 15, 2006 showed  stable dimensions of an infrarenal fusiform aneurysm 4.8 x 4.7 cm,  bilateral common iliac artery dilatation but less than 2 cm bilaterally.  Most recent echo from April 30, 2006 demonstrated normal left  ventricular systolic function with an LVEF of 60-65%, mild aortic  insufficiency and mild to moderate aortic root dilatation.   Laboratory data was reviewed as well. Most recent creatinine was 1.6,  cholesterol was 155, triglycerides 81, HDL 38, LDL 101.   ASSESSMENT:  Robert Ritter is currently stable from a cardiovascular  standpoint. His cardiac problems are as follows:  1. Coronary artery disease status post coronary bypass surgery and      multiple PCI procedures. He is stable with no angina. He should      remain on dual antiplatelet therapy with aspirin and clopidogrel. I      would not favor a beta blocker in the setting of is bradycardia,      normal left ventricular function and absence of angina.  2. Dyslipidemia. His LDL is above goal on Vytorin. However he tells me      that at the time it was drawn back in May he was not taking it      regularly. He has been compliant and taking his medication daily      since then. We will recheck lipids and LFTs next month to see how      he has responded to regular daily cholesterol-lowering medication.  3. Hypertension. Blood pressure is optimal on a combination of      Micardis, Terazosin, and Lasix. Continue current therapy.  4.  Abdominal aortic aneurysm. Stable dimensions. Followup ultrasound      in November.   For followup, I will see Robert Ritter back in January. He will have  followup 2-D echocardiogram and abdominal aortic ultrasound in November.  Will contact him by phone after his lipids and LFTs are complete next  month.     Veverly Fells. Excell Seltzer, MD  Electronically Signed    MDC/MedQ  DD: 02/07/2007  DT: 02/08/2007  Job #: [id number]

## 2010-11-08 NOTE — Assessment & Plan Note (Signed)
[facility]                            CARDIOLOGY OFFICE NOTE   NAME:Eblin, VINICIUS BROCKMAN                     MRN:          [id number]  DATE:03/03/2008                            DOB:          [birth year]    REASON FOR VISIT:  Followup coronary artery disease and abdominal aortic  aneurysm.   HISTORY OF PRESENT ILLNESS:  Mr. Weekes is a 77 year old gentleman  with CAD status post remote coronary bypass surgery and multiple PCI  procedures.  He presents today for followup of his cardiovascular  disease.  He has a abdominal aortic aneurysm that has been followed over  time with serial ultrasounds.  He underwent a ultrasound this morning  that showed stable dimensions of his infrarenal AAA at 4.8 x 4.8 cm.  Bilateral common iliac aneurysms were noted as well.  His iliac  dimensions were less than 2 cm in diameter.   From a symptomatic standpoint, Mr. Sakai is doing quite well.  He  has become more sedentary, but still does some yard work from time to  time.  He is not engaged in regular exercise.  He denies any cardiac  symptoms.  Specifically, he denies chest pain or pressure.  He denies  dyspnea, orthopnea, or PND.  He develops edema when he does not take his  Lasix regularly.   CURRENT MEDICATIONS:  1. Aspirin 162 mg daily.  2. Vytorin 10/40 mg daily.  3. Lasix 80 mg twice daily.  4. Terazosin 5 mg daily.   ALLERGIES:  SULFA.   PHYSICAL EXAMINATION:  GENERAL:  The patient is an elderly male.  He is  in no acute distress.  He is well-appearing.  VITAL SIGNS:  Weight is 169 pounds, blood pressure 124/58, heart rates  49, respiratory rates 16.  HEENT:  Normal.  NECK:  Normal carotid upstrokes.  No bruits.  JVP normal.  LUNGS:  Clear bilaterally.  HEART:  Regular rate and rhythm with a 2/6 crescendo-decrescendo murmur  at the right upper sternal border.  There are no diastolic murmurs or  gallops.  ABDOMEN:  Soft, nontender.  No abdominal  bruits.  The abdominal aorta is  enlarged and easily palpable.  EXTREMITIES:  No clubbing, cyanosis, or edema.  Femoral pulses are 2+.  Pedal pulses are intact and equal.  SKIN:  Warm and dry without rash.   EKG shows sinus bradycardia with the heart rate of 49 beats per minute.  Nonspecific ST abnormality.   ASSESSMENT:  1. Coronary artery disease status post coronary artery bypass      grafting.  The patient stable with no angina.  Continue      antiplatelet therapy with aspirin.  Bradycardia precludes use of      beta-blocker.  2. Abdominal aortic aneurysm.  Stable dimensions.  Followup in 6      months.  Again, no beta-blocker secondary to bradycardia.   For followup, I would like to see Mr. Geis back in 6 months.  If he  has problems in the interim, I would be happy to see him sooner.     Veverly Fells. Excell Seltzer,  MD  Electronically Signed    MDC/MedQ  DD: 03/03/2008  DT: 03/03/2008  Job #: [id number]   cc:   Dyke Maes, M.D.  Georgina Quint. Plotnikov, MD

## 2010-11-08 NOTE — Assessment & Plan Note (Signed)
[facility]                            CARDIOLOGY OFFICE NOTE   NAME:Ritter, Robert                       MRN:          [id number]  DATE:11/22/2006                            DOB:          [birth year]    [name] is a very pleasant 77 year old white married male with a  long history of coronary artery disease, 26 years post CABG by Dr.  Colvin R[facility] at the [facility]  He had subsequent  percutaneous interventions of the vein grafts as noted on the August 01, 2005 note.  He also has a stable abdominal aortic aneurysm at 4.8 x  4.7 cm.  He had stable dimensions of bilateral common iliac artery  aneurysms at less than 2 cm.  The patient is having no cardiac symptoms.  The previous percutaneous interventions included Taxus and Cipher stent  and high-grade vein grafts.  I believe he also had a vein graft to the  LAD, as stated.   He has no recurrent symptoms and generally getting along quite well.  He  is on aspirin 162; Vytorin 10/40, though he is not taking this  regularly; Lasix 40 b.i.d., Micardis 40, and terazosin.   Most recent echocardiogram on April 30, 2006 revealed mild aortic  regurgitation, mildly thickened mitral valve, EF of 60-65%.  There was  also mild to moderate iliac root dilatation.   PHYSICAL EXAMINATION:  VITAL SIGNS:  Blood pressure 122/60, pulse 50,  sinus bradycardia.  GENERAL APPEARANCE:  Normal.  NECK:  JVP is not elevated.  Carotid pulses palpable and equal, without  bruits.  LUNGS:  Clear.  CARDIAC:  Reveals 2/6 short systolic ejection murmur at the aortic area,  faint diastolic blow of aortic regurgitation.  EXTREMITIES:  Normal.   EKG reveals sinus bradycardia, otherwise normal.  The lipid profile  reveals a total up to 155 from previous 115.  The LDL is up from 66 to  101, and the patient states he has not been taking his Vytorin  regularly.   IMPRESSION:  1. Diagnosis as above.  2. Twenty-six  years post coronary artery bypass graft, with subsequent      percutaneous intervention with stenting of the saphenous vein      graft.  3. Chronic renal insufficiency, followed by Dr. Briant Ritter.  4. Abdominal aortic aneurysm, 4.8 x 4.7 cm, that is stable.  5. Hypertension.  6. Mild aortic sclerosis.   Specifically, the percutaneous intervention included a Cypher stent to  the high-grade LAD vein graft, and Taxus and Cypher stents to the  saphenous vein graft to the obtuse marginal 2.   PLAN:  Plan is to take his Vytorin regularly.  Refer him to the lipid  clinic, and have him see Dr. Tonny Ritter in followup in three months.  Also, he will need a follow-up 2D echocardiogram.     E. Robert Congress, MD, [facility]  Electronically Signed    EJL/MedQ  DD: 11/22/2006  DT: 11/22/2006  Job #: [id number]

## 2010-11-09 ENCOUNTER — Ambulatory Visit (INDEPENDENT_AMBULATORY_CARE_PROVIDER_SITE_OTHER): Payer: Medicare Other | Admitting: Cardiovascular Disease

## 2010-11-09 ENCOUNTER — Encounter: Payer: Medicare Other | Admitting: Cardiology

## 2010-11-09 ENCOUNTER — Encounter: Payer: Self-pay | Admitting: Cardiovascular Disease

## 2010-11-09 ENCOUNTER — Encounter (INDEPENDENT_AMBULATORY_CARE_PROVIDER_SITE_OTHER): Payer: Medicare Other | Admitting: Cardiology

## 2010-11-09 DIAGNOSIS — I714 Abdominal aortic aneurysm, without rupture: Secondary | ICD-10-CM

## 2010-11-09 DIAGNOSIS — I10 Essential (primary) hypertension: Secondary | ICD-10-CM

## 2010-11-09 DIAGNOSIS — I723 Aneurysm of iliac artery: Secondary | ICD-10-CM

## 2010-11-09 DIAGNOSIS — I2510 Atherosclerotic heart disease of native coronary artery without angina pectoris: Secondary | ICD-10-CM

## 2010-11-09 NOTE — Patient Instructions (Signed)
Your physician wants you to follow-up in: 6 MONTHS.  You will receive a reminder letter in the mail two months in advance. If you don't receive a letter, please call our office to schedule the follow-up appointment.  Your physician recommends that you continue on your current medications as directed. Please refer to the Current Medication list given to you today.  Your physician has requested that you have an abdominal aorta duplex in 6 MONTHS. During this test, an ultrasound is used to evaluate the aorta. Allow 30 minutes for this exam. Do not eat after midnight the day before and avoid carbonated beverages

## 2010-11-09 NOTE — Progress Notes (Signed)
HPI:  This is an 77 year old gentleman who presents today for followup of coronary artery disease and AAA. He underwent remote multivessel coronary bypass surgery in 1981 and has had multiple subsequent PCI procedures. He has had a 5cm AAA followed by serial ultrasound.  Abdominal duplex performed today demonstrated stable dimensions of an infrarenal fusiform abdominal aortic aneurysm at 5.1 x 4.9 cm.    Overall the patient is doing fairly well. He notes some drop in his blood pressure mostly in the mornings with standing. When this occurs he drinks some soup and his symptoms resolved. He denies syncope. He has been able to remain active and he still works in his garden. He denies exertional chest pain or pressure. He's had no abdominal pain. He denies shortness of breath or edema.  Outpatient Encounter Prescriptions as of 11/09/2010  Medication Sig Dispense Refill  . aspirin 325 MG tablet Take 162.5 mg by mouth daily. Take 1/2 tablet      . bimatoprost (LUMIGAN) 0.03 % ophthalmic drops Place 1 drop into both eyes at bedtime.        Marland Kitchen ezetimibe-simvastatin (VYTORIN) 10-40 MG per tablet Take 1 tablet by mouth at bedtime.        . furosemide (LASIX) 80 MG tablet Take 80 mg by mouth 2 (two) times daily.        . nitroGLYCERIN (NITROSTAT) 0.4 MG SL tablet Place 0.4 mg under the tongue as needed.        . NON FORMULARY Place 1 drop into both eyes 2 (two) times daily. CAMAGAM EYE DROPS       . terazosin (HYTRIN) 5 MG capsule Take 5 mg by mouth daily.        Marland Kitchen DISCONTD: promethazine (PHENERGAN) 25 MG tablet Take 25 mg by mouth 3 (three) times daily as needed. For nausea        . DISCONTD: allopurinol (ZYLOPRIM) 100 MG tablet Take 200 mg by mouth daily.          Allergies  Allergen Reactions  . Sulfatol C     Past Medical History  Diagnosis Date  . CAD (coronary artery disease)     S/P CABG and multiple PCI's  . Hyperlipidemia   . HTN (hypertension)   . AAA (abdominal aortic aneurysm)     5 cm  on 2011 assessment  . Gout 2009  . Renal insufficiency   . Elevated PSA     Dr Earlene Plater    ROS: Negative except as per HPI  BP 136/60  Pulse 49  Resp 18  Ht 5\' 10"  (1.778 m)  Wt 167 lb 12.8 oz (76.114 kg)  BMI 24.08 kg/m2  PHYSICAL EXAM: Pt is alert and oriented, elderly male in NAD HEENT: normal Neck: JVP - normal, carotids 2+= without bruits Lungs: CTA bilaterally CV: RRR without murmur or gallop Abd: soft, NT, Positive BS, no hepatomegaly, enlarged abdominal aorta easily palpable Ext: no C/C/E, distal pulses intact and equal Skin: warm/dry no rash  EKG:  Marked sinus bradycardia 49 beats per minute, otherwise within normal limits.  ASSESSMENT AND PLAN:

## 2010-11-11 ENCOUNTER — Encounter: Payer: Self-pay | Admitting: Cardiovascular Disease

## 2010-11-11 NOTE — Op Note (Signed)
[name], Robert Ritter              ACCOUNT NO.:  [id number]   MEDICAL RECORD NO.:  [id number]          PATIENT TYPE:  OIB   LOCATION:  2852                         FACILITY:  [facility]   PHYSICIAN:  Dyke Maes, M.D.DATE OF BIRTH:  1920-04-15   DATE OF PROCEDURE:  05/08/2005  DATE OF DISCHARGE:                                 OPERATIVE REPORT   PROCEDURE PERFORMED:  Left percutaneous renal biopsy.   REASON FOR PROCEDURE:  Nephrotic syndrome.   DESCRIPTION OF PROCEDURE:  The risks of the procedure were explained to the  patient, informed consent obtained.  The patient was placed in prone  position and the left flank was prepped and draped in sterile fashion and  local anesthesia was obtained with 10 mL of 1% Xylocaine.  Under direct  ultrasound guidance, a 15 gauge ASAP biopsy needle was placed in the  inferior pole of the left kidney on three separate occasions with return of  three cores of renal tissue.  The patient tolerated the procedure well.           ______________________________  Dyke Maes, M.D.     MTM/MEDQ  D:  05/08/2005  T:  05/09/2005  Job:  [id number]

## 2010-11-11 NOTE — Cardiovascular Report (Signed)
NAMENEILSON, OEHLERT             ACCOUNT NO.:  [id number]   MEDICAL RECORD NO.:  [id number]          PATIENT TYPE:  INP   LOCATION:  1833                         FACILITY:  [facility]   PHYSICIAN:  Charlton Haws, M.D.     DATE OF BIRTH:  07/16/1919   DATE OF PROCEDURE:  04/15/2004  DATE OF DISCHARGE:                              CARDIAC CATHETERIZATION   INDICATIONS FOR PROCEDURE:  Previous bypass surgery with unstable angina.   Cine catheterization was done from the right femoral artery.  The patient  has a known abdominal aortic aneurysm.  Great care was taken using long wire  exchanges to get to the ascending aorta.   The left main coronary artery had an 80% mid vessel stenosis.  The left  anterior descending artery and circumflex were 100% occluded proximally.  The right coronary artery was difficult to engage, it was known to be  occluded in the mid vessel 100% previously.  The circle graft to the PDA,  PLA, and OM was patent.  There is a 40% lesion at the take off of the PDA  and there was an 80% lesion before the take off of the obtuse marginal  branch in the high lateral wall.  Saphenous vein graft to the LAD had a  tight 95% distal stenosis.  Ventriculography showed normal LV function,  ejection fraction was 60%.  There was a small gradient across the aortic  valve.  We had to cross it with a straight pigtail and a stiff Amplatz J-  wire.  The aortic root was moderately dilated.  Since the patient will most  likely have a two vessel angioplasty, we did not shoot a distal aortogram to  assess his aneurysm.  Aortic pressure was 145/60.  LV pressure was 154/10.   IMPRESSION:  Films were reviewed with Dr. Samule Ohm.  He will proceed with PTCA  and/or stenting of the LAD graft.  Depending on how this goes, he may  intervene on the distal portion of the circle graft, as well.       PN/MEDQ  D:  04/15/2004  T:  04/15/2004  Job:  [id number]   cc:   Cecil Cranker, M.D.

## 2010-11-11 NOTE — Cardiovascular Report (Signed)
NAME:  Robert Ritter, Robert Ritter             ACCOUNT NO.:  [id number]   MEDICAL RECORD NO.:  [id number]          PATIENT TYPE:  INP   LOCATION:  6522                         FACILITY:  [facility]   PHYSICIAN:  Salvadore Farber, M.D. LHCDATE OF BIRTH:  Nov 14, 1919   DATE OF PROCEDURE:  04/19/2004  DATE OF DISCHARGE:  04/16/2004                              CARDIAC CATHETERIZATION   PROCEDURE:  Drug eluting stent placement in the saphenous vein graft to the  LAD using filter wire distal protection, drug eluting stent placement in the  distal anastomosis of the saphenous vein graft to the second obtuse marginal  branch, drug eluting stent placement in the body of the saphenous vein graft  to the second obtuse marginal using PercuSurge distal protection.   INDICATIONS FOR PROCEDURE:  Mr. Sandi Mariscal is an 77 year old gentleman  status post coronary artery bypass grafting in 1981.  He has generally done  very well since bypass surgery and now presents with unstable angina.  Diagnostic angiography by Dr. Eden Emms demonstrated severe stenoses in each of  his two saphenous vein grafts as well as in the distal anastomosis of the  saphenous vein graft to the second obtuse marginal.  I was asked to perform  percutaneous revascularization.   DESCRIPTION OF PROCEDURE:  Informed consent had been obtained prior to the  diagnostic procedure.  Anticoagulation was initiated with bivalirudin and  clopidogrel.  Attention was first turned to the sequential saphenous vein  graft to the diagonal and LAD.  An AL sheath was upsized to 7 [place].  A 7  [place] AL2 guide was advanced over a wire and engaged in the ostium of the  saphenous vein graft.  I was unable to make the guide fully coaxial.  Therefore, a Luge wire was initially advanced beyond the lesion into the  distal vein graft.  Using this for support, I was able to make the guide  coaxial.  A filter wire was then advanced beyond the lesion at the diagonal  anastomosis into the distal portion of the vein graft.  The Luge wire was  then removed.  The lesion was directly stented using a 3.5 x 13 mm CYPHER at  16 atmospheres.  It was then post dilated using a 3.5 x 12 mm Quantum also  at 16 atmospheres.  A repeat angiography demonstrated no residual stenosis  and TIMI-3 flow.  A filter wire was then retrieved using the bent retrieval  sheath.  Final angiography demonstrated no residual stenosis and TIMI-3 flow  to the distal vasculature.   Attention was then turned to the two lesions in the saphenous vein graft to  the second obtuse marginal.  A 7 [place] multipurpose guide was advanced over  a wire and engaged in the ostium of the vein graft.  A Luge wire was  advanced through the vein graft beyond the anastomosis of the PDA and into  the second obtuse marginal.  This lesion was predilated using a 2.0 x 12 mm  Maverick at 12 atmospheres.  This high pressure was required to force the  lesion to yield.  The lesion was then stented  using a 2.5 x 16 mm TAXUS  deployed at 16 atmospheres.  I then attempted to post dilate the TAXUS.  However, a PowerSail balloon was not long enough to reach the lesion given  the very lengthy vein graft.  It was approximately 20% residual stenosis  with TIMI-3 flow.   Attention was then turned to the lesion in the body of the saphenous vein  graft to the second obtuse marginal.  There was not enough landing zone to  permit filter wire distal protection.  Therefore, PercuSurge was used.  The  PercuSurge wire was advanced beyond the lesion into the most distal portion  of the saphenous vein graft.  The PercuSurge balloon was inflated to 6 mm.  This resulted in cessation of flow in the distal portion of the vein graft.  There remained flow to the PDA.  The lesion was directly stented using a 3.0  x 18 mm CYPHER deployed at 16 atmospheres.  The stent failed to fully  dilate.  It was then post dilated using a 3.0 x 15 mm  Quantum requiring 28  atmospheres to fully dilate.  The vein graft was then aspirated using the  Export catheter.  Examination of the results of this aspiration yielded  substantial atheromatous debris.  PercuSurge balloon was then deflated.  Final angiography demonstrated approximately 10% residual stenosis with TIMI-  3 flow to the distal vasculature.  The patient tolerated the procedure well  and was transferred to the holding room in stable condition.  Bivalirudin  was discontinued at the end of the case.   COMPLICATIONS:  None.   IMPRESSION/RECOMMENDATIONS:  Successful drug eluting stent placement in  saphenous vein graft to left anterior descending at the anastomosis with the  diagonal, successful drug eluting stent in the distal anastomosis of the  saphenous vein to the second obtuse marginal and successful drug eluting  stent placement in the body of the saphenous vein graft to the second obtuse  marginal.  Given the age of his now degenerating vein grafts would recommend  continuation of aspirin and Plavix indefinitely.       WED/MEDQ  D:  04/19/2004  T:  04/19/2004  Job:  [id number]   cc:   Cecil Cranker, M.D.   Georgina Quint. Plotnikov, M.D. [facility]

## 2010-11-11 NOTE — Op Note (Signed)
[facility]  Patient:    Robert Ritter, Robert Ritter Visit Number: [id number] MRN: [id number]          Service Type: Attending:  Lucrezia Starch. Ovidio Hanger, M.D. Dictated by:   Lucrezia Starch. Ovidio Hanger, M.D. Proc. Date: 07/26/01                             Operative Report  PREOPERATIVE DIAGNOSES: 1. Balanitis. 2. Significant penile adhesions.  OPERATION:  Circumcision.  SURGEON:  Lucrezia Starch. Ovidio Hanger., M.D.  ANESTHESIA:  General laryngeal airway.  ESTIMATED BLOOD LOSS:  10 cc.  TUBES:  None.  COMPLICATIONS:  None.  INDICATIONS:  Robert Ritter is a very nice 77 year old white male who has had significant problems with foreskin cracking and bleeding.  He has used Mycolog cream, and it has helped but the adhesions still stick, cracks and bleeds and becomes irritated.  After understanding the risks, benefits and alternatives, he has elected to proceed with a circumcision.  DESCRIPTION OF PROCEDURE:  The patient was placed in the supine position. After proper laryngeal anesthesia, he was prepped and draped with Betadine in s sterile fashion.  He was noted to have severe dense adhesions throughout the cornua areata sticking to the mid glans penis.  With a hemostat, this was carefully dissected free maintaining as much of the mucosa as possible down to dissect the foreskin completely off of the glans penis.  The area was prepped with Betadine.  A circumferential incision was made in the shaft skin at the appropriate length and extended down to [employer] fascia and the corpus spongiosum and another circumferential incision was made approximately 2 mm proximal to the corona areata and extended to a similar level.  Dorsal slit was performed.  The foreskin was excised utilizing both blunt and sharp dysoxidating and bleeders were coagulated with Bovie coagulation cautery. Thorough irrigation was performed.  Good hemostasis was noted to be present. The shaft skin was then  approximated to the mucosa.  A dorsal stitch was placed and a U-type stitch was placed in the frenal area and running locked stitches were used.  Thorough irrigation was performed.  Good hemostasis was noted to be present.  With the extensive dissection on the glans penis it was felt that a dorsal penile block should be performed, and 10 cc of 0.25 Marcaine was used to make a dorsal penile block.  The wound was dressed with Vaseline gauze, 4 x 4 and Coban.  He tolerated the procedure well and was taken to the recovery room stable.  Foreskin was submitted to pathology. Dictated by:   Lucrezia Starch. Ovidio Hanger, M.D. Attending:  Lucrezia Starch. Ovidio Hanger, M.D. DD:  07/26/01 TD:  07/26/01 Job: [id number]

## 2010-11-11 NOTE — Discharge Summary (Signed)
Robert Ritter, Robert Ritter             ACCOUNT NO.:  [id number]   MEDICAL RECORD NO.:  [id number]          PATIENT TYPE:  INP   LOCATION:  6522                         FACILITY:  [facility]   PHYSICIAN:  Jesse Sans. Wall, M.D.   DATE OF BIRTH:  06-29-1919   DATE OF ADMISSION:  04/15/2004  DATE OF DISCHARGE:  04/16/2004                           DISCHARGE SUMMARY - REFERRING   PROCEDURE:  Coronary angiogram/percutaneous intervention on April 15, 2004.   REASON FOR ADMISSION:  The patient is an 77 year old male with known history  of coronary artery disease, who presented with recurrent angina pectoris in  the context of recent negative Cardiolite.  Plan was to admit for further  evaluation and management.  Please refer to dictated admission note for  further details.   LABORATORY DATA:  Initial cardiac markers negative.  Post procedure CPK  140/6.6 (4.7%), hemoglobin 11, hematocrit 33, WBC 7.9, platelets 167 at  discharge.  Sodium 137, potassium 4.1, glucose 86, BUN 19, creatinine 1.2 at  discharge.   HOSPITAL COURSE:  The patient underwent coronary angiography revealing  significant coronary artery disease progression and was thus referred to  Salvadore Farber, M.D. [facility] for percutaneous intervention.  He underwent  subsequent successful drug-eluding stenting (Cypher/Taxus) of the 95%  SVG/LAD, 99% SVG/OM2 at distal anastomosis, and 90% SVG/OM2.  There were no  noted complications.   The patient was kept for overnight observation, cleared for discharge the  following morning in hemodynamically stable condition.   RECOMMENDATIONS:  Continue Plavix indefinitely.  At discharge, the patient  was noted to have elevated blood pressure (170 systolic).  Dr. Samule Ohm  recommended addition of Norvasc 5 daily with close monitoring of blood  pressures at home.   DISCHARGE MEDICATIONS:  1.  Plavix 75 mg daily. (New)  2.  Norvasc 5 mg daily.  (New)  3.  Enteric-coated aspirin 325 mg daily.  4.   Altace 10 mg b.i.d.  5.  Zocor 20 mg daily.  6.  Proscar 5 mg daily.  7.  Flomax 0.4 mg daily.  8.  Nitrostat 0.4 mg p.r.n.   DISCHARGE INSTRUCTIONS:  No heavy lifting (greater than 10 pounds) x1 week;  no driving x2 days; call if there is any swelling/bleeding of the groin.   FOLLOW UP:  Arrangements will be made for the patient to follow up with E.  Graceann Congress, M.D. in two weeks.   DISCHARGE DIAGNOSES:  1.  Coronary artery disease progression.      1.  Status post stenting (drug-eluding) of saphenous vein graft to left          anterior descending, saphenous vein graft to obtuse marginal #2 (x2)          grafts on April 15, 2004.      2.  Normal left ventricular function.      3.  Status post coronary artery bypass graft in 1981.  2.  Sinus bradycardia.  3.  Hypertension.  4.  Dyslipidemia.  5.  Known abdominal aortic aneurysm.       GS/MEDQ  D:  04/16/2004  T:  04/16/2004  Job:  [id number]   cc:   Georgina Quint. Plotnikov, M.D. [facility]

## 2010-11-11 NOTE — Assessment & Plan Note (Signed)
[facility]                            CARDIOLOGY OFFICE NOTE   NAME:Robert Ritter, Robert Ritter                     MRN:          [id number]  DATE:07/16/2006                            DOB:          1919-10-19    The patient is a very pleasant 77 year old white married male with a  long history of coronary artery disease 25 years post coronary artery  bypass grafting surgery by Dr. Colvin Caroli at the [place] [facility] [place].  He has had subsequent percutaneous interventions as noted in the recent  cath note, and on the note of August 01, 2005.   He also has an abdominal aortic aneurysm, most recently measured at  4.7x4.7 cm.  To be repeated in May 2008.  Most recent echocardiogram of  Oct 25, 2005 revealed ejection fraction of 60-65% with mild aortic  regurgitation.  Mild to moderate aortic root dilatation.   The patient is having no cardiac symptoms.   MEDICATIONS:  1. Flomax.  2. Plavix, which he has not taken recently because of expense.  3. Aspirin __________.  4. Lasix 40 b.i.d.  5. Micardis 40.   Blood pressure 120/58, pulse 53, normal sinus rhythm.  GENERAL APPEARANCE:  Normal.  JVPs not elevated.  Carotid pulses are palpable and equal without  bruits.  LUNGS:  Clear.  CARDIAC:  Exam reveals 1/6 short systolic ejection murmur at the aortic  area, 1/6 diastolic murmur at the aortic area.  ABDOMEN:  Unremarkable.  EXTREMITIES:  Normal.   The patient is followed for his chronic renal disease by Dr. Briant Cedar.  Most recent labs revealed a BUN of 31, creatinine of 1.7, but that was  April 28, 2006.   DIAGNOSES:  As above.  The patient is stable.  EKG reveals an old  anterior septal myocardial infarction and otherwise unremarkable.  Will  plan to see him back in 4 months.  He should have a followup lipid and  LFTs and abdominal ultrasound prior to that.     Cecil Cranker, MD, [facility]  Electronically Signed    EJL/MedQ  DD: 07/16/2006  DT:  07/16/2006  Job #: [id number]

## 2010-11-11 NOTE — Assessment & Plan Note (Signed)
 [facility]ALTHC[facility]                              CARDIOLOGY OFFICE NOTE   NAME:Robert Ritter, EDMAN LIPSEY                     MRN:          [id number]  DATE:03/01/2006                            DOB:          [birth year]    HISTORY OF PRESENT ILLNESS:  The patient is a very pleasant 77 year old,  white married male with long history of coronary artery disease 25 years.  A  CBG by Dr. Colvin Caroli with subsequent percutaneous interventions as noted  on the most recent cath report and on the note of August 01, 2005.  He has  had chronic renal insufficiency and has been followed by Dr. Briant Cedar with  renal biopsy revealing emboli.  He also has abdominal aortic aneurysm  4.7x4.7, hyperlipidemia and hypertension, mild LV hypertrophy, normal EF,  has aortic valve sclerosis and mild aortic regurgitation.  There is some  septal thickening.  The patient is feeling quite well and is better than his  last visit six months ago.  We should note that he has not been on a beta  blocker because he has had sinus bradycardia in the low 50's in the past.   MEDICATIONS:  1. Flomax.  2. Plavix 75  3. Aspirin 162.  4. Lasix 40 b.i.d.  5. Micardis 40.   PHYSICAL EXAMINATION:  VITAL SIGNS:  Blood pressure 132/67, pulse 53, sinus  bradycardia.  GENERAL APPEARANCE:  Normal.  NECK:  JVP is not elevated.  Carotid pulses bilaterally equal without  bruits.  LUNGS:  Clear.  CARDIAC:  A 1-2/6 short systolic ejection murmur.  Aortic area 106,  diastolic below aortic area.  EXTREMITIES:  Normal.   STUDIES:  EKG reveals sinus bradycardia, minor ST changes.   IMPRESSION:  Diagnosis as above.  The patient is quite stable and doing  well.  He will have follow up abdominal ultrasound concerning the abdominal  aneurysm, a follow up 2D echo concerning the aortic sclerosis and aortic  regurgitation.  He will also have a BNP and lipid, although, his BNP in July  per Dr. Briant Cedar revealed EF of 57,  creatinine 1.8.   PLAN:  I plan to see him back in six months or p.r.n.                             E. Graceann Congress, MD, [facility]    EJL/MedQ  DD:  03/01/2006  DT:  03/01/2006  Job #:  [id number]

## 2010-11-11 NOTE — Procedures (Signed)
[facility]                                PROCEDURE NOTE   NAME:Mckelvie, PACER DORN                     MRN:          [id number]  DATE:06/12/2006                            DOB:          February 04, 1920    PROCEDURE:  Abscess incision and drainage.   INDICATIONS FOR PROCEDURE:  Abscess right lower medial shin. The risks  and benefits explained to the patient in detail.   The area was prepped with Betadine and alcohol, incision was made with  scissors, the rooftop removed. A couple drops of purulent material was  expressed and removed. Culture obtained. Antibiotic ointment with Band-  Aid dressing applied.     Georgina Quint. Plotnikov, MD  Electronically Signed    AVP/MedQ  DD: 06/12/2006  DT: 06/12/2006  Job #: [id number]

## 2010-11-18 ENCOUNTER — Encounter: Payer: Self-pay | Admitting: Cardiovascular Disease

## 2010-11-18 NOTE — Assessment & Plan Note (Signed)
Large but stable AAA. Size remains below the threshold for surgical intervention or endovascular repair. Will continue 6 month surveillance duplex ultrasounds. The patient is not a candidate for beta blocker due to marked resting bradycardia.

## 2010-11-18 NOTE — Assessment & Plan Note (Signed)
Stable without angina. Continue aspirin and lipid-lowering therapy with Vytorin.

## 2010-11-18 NOTE — Assessment & Plan Note (Signed)
Blood pressure remains at goal. Unable to push blood pressure in the lower especially in the setting of episodic symptomatic hypotension.

## 2010-12-12 ENCOUNTER — Ambulatory Visit (INDEPENDENT_AMBULATORY_CARE_PROVIDER_SITE_OTHER): Payer: Medicare Other | Admitting: Internal Medicine

## 2010-12-12 ENCOUNTER — Encounter: Payer: Self-pay | Admitting: Internal Medicine

## 2010-12-12 DIAGNOSIS — R972 Elevated prostate specific antigen [PSA]: Secondary | ICD-10-CM

## 2010-12-12 DIAGNOSIS — E785 Hyperlipidemia, unspecified: Secondary | ICD-10-CM

## 2010-12-12 DIAGNOSIS — N259 Disorder resulting from impaired renal tubular function, unspecified: Secondary | ICD-10-CM

## 2010-12-12 DIAGNOSIS — I714 Abdominal aortic aneurysm, without rupture: Secondary | ICD-10-CM

## 2010-12-12 NOTE — Assessment & Plan Note (Signed)
Monitoring

## 2010-12-12 NOTE — Assessment & Plan Note (Signed)
Monitoring PSA 

## 2010-12-12 NOTE — Progress Notes (Signed)
  Subjective:    Patient ID: Robert Ritter, male    DOB: 26-Mar-1920, 77 y.o.   MRN: [id number]  HPI  The patient presents for a follow-up of  chronic hypertension, chronic dyslipidemia, AAA, CAD controlled with medicines    Review of Systems  Constitutional: Negative for appetite change, fatigue and unexpected weight change.  HENT: Negative for nosebleeds, congestion, sore throat, sneezing, trouble swallowing and neck pain.   Eyes: Negative for itching and visual disturbance.  Respiratory: Negative for cough.   Cardiovascular: Negative for chest pain, palpitations and leg swelling.  Gastrointestinal: Negative for nausea, diarrhea, blood in stool and abdominal distention.  Genitourinary: Negative for frequency and hematuria.  Musculoskeletal: Negative for back pain, joint swelling and gait problem.  Skin: Negative for rash.  Neurological: Negative for dizziness, tremors, speech difficulty and weakness.  Psychiatric/Behavioral: Negative for sleep disturbance, dysphoric mood and agitation. The patient is not nervous/anxious.        Objective:   Physical Exam  Constitutional: He is oriented to person, place, and time. He appears well-developed.  HENT:  Mouth/Throat: Oropharynx is clear and moist.  Eyes: Conjunctivae are normal. Pupils are equal, round, and reactive to light.  Neck: Normal range of motion. No JVD present. No thyromegaly present.  Cardiovascular: Normal rate, regular rhythm, normal heart sounds and intact distal pulses.  Exam reveals no gallop and no friction rub.   No murmur heard. Pulmonary/Chest: Effort normal and breath sounds normal. No respiratory distress. He has no wheezes. He has no rales. He exhibits no tenderness.  Abdominal: Soft. Bowel sounds are normal. He exhibits no distension and no mass. There is no tenderness. There is no rebound and no guarding.  Musculoskeletal: Normal range of motion. He exhibits no edema and no tenderness.  Lymphadenopathy:   He has no cervical adenopathy.  Neurological: He is alert and oriented to person, place, and time. He has normal reflexes. No cranial nerve deficit. He exhibits normal muscle tone. Coordination normal.  Skin: Skin is warm and dry. No rash noted.       AKs  Psychiatric: He has a normal mood and affect. His behavior is normal. Judgment and thought content normal.          Assessment & Plan:

## 2010-12-12 NOTE — Assessment & Plan Note (Signed)
He had it checked 3 wks ago

## 2010-12-12 NOTE — Assessment & Plan Note (Signed)
Cont Rx 

## 2010-12-22 ENCOUNTER — Ambulatory Visit (INDEPENDENT_AMBULATORY_CARE_PROVIDER_SITE_OTHER): Payer: Medicare Other | Admitting: Internal Medicine

## 2010-12-22 ENCOUNTER — Encounter: Payer: Self-pay | Admitting: Internal Medicine

## 2010-12-22 VITALS — BP 130/70 | HR 80 | Temp 97.1°F | Resp 16 | Wt 163.0 lb

## 2010-12-22 DIAGNOSIS — I714 Abdominal aortic aneurysm, without rupture: Secondary | ICD-10-CM

## 2010-12-22 DIAGNOSIS — I700 Atherosclerosis of aorta: Secondary | ICD-10-CM

## 2010-12-26 NOTE — Progress Notes (Signed)
  Subjective:    Patient ID: Robert Ritter, male    DOB: [birth year], 77 y.o.   MRN: [id number]  HPI  Abd aorta US  Review of Systems     Objective:   Physical Exam        Assessment & Plan:   Procedure Note :    Procedure :   Sonography examination, abd. aorta   Indication: h/o AAA    Equipment used: Sonosite M-Turbo with P21x/5-1 MHz transducer array probe. The images were stored in the unit and later transferred in storage.  The patient was placed in a decubitus position.   This study revealed diffuse atherosclerosis of the aorta.    Proximal aorta 3.35 x 4.23 cm  Mid aorta  3.42 x 3.19 cm with a 5.40 x 5.44 cm aneurism which was 6.72 cm long  Distal aorta WNL  Impression:   AAA, large. Proximal aorta 3.35 x 4.23 cm  Mid aorta  3.42 x 3.19 cm with a 5.40 x 5.44 cm aneurism which was 6.72 cm long  Distal aorta WNL    F/u w/Dr Excell Seltzer

## 2010-12-29 ENCOUNTER — Telehealth: Payer: Self-pay | Admitting: Cardiovascular Disease

## 2010-12-29 NOTE — Telephone Encounter (Signed)
Pt wants to talk to Dr. Excell Seltzer wont tell me what it is about it

## 2010-12-29 NOTE — Telephone Encounter (Signed)
Called and spoke with patient. He does not wish to speak to a nurse right now & would only like to speak with Dr. Excell Seltzer. I did inform him that it may not be until next week. He understands.

## 2010-12-30 NOTE — Telephone Encounter (Signed)
I spoke with patient last night. He had an aortic duplex recently by Dr Posey Rea raising question of enlarging AAA. I spoke with Dr Posey Rea about this today. The patient has been followed regularly in our vascular lab with a recent visceral duplex showing stable AAA dimensions of 4.9x5.1  Cm. Will continue to follow at 6 months intervals.

## 2011-01-08 NOTE — Assessment & Plan Note (Signed)
Discussed w/Dr Excell Seltzer. No change - stable.   No charge

## 2011-02-09 ENCOUNTER — Telehealth: Payer: Self-pay | Admitting: Cardiology

## 2011-02-09 DIAGNOSIS — I714 Abdominal aortic aneurysm, without rupture: Secondary | ICD-10-CM

## 2011-02-09 NOTE — Telephone Encounter (Signed)
Order put in for abdominal aorta duplex.

## 2011-02-28 ENCOUNTER — Ambulatory Visit (INDEPENDENT_AMBULATORY_CARE_PROVIDER_SITE_OTHER): Payer: Medicare Other | Admitting: Internal Medicine

## 2011-02-28 ENCOUNTER — Encounter: Payer: Self-pay | Admitting: Internal Medicine

## 2011-02-28 DIAGNOSIS — E785 Hyperlipidemia, unspecified: Secondary | ICD-10-CM

## 2011-02-28 DIAGNOSIS — I714 Abdominal aortic aneurysm, without rupture: Secondary | ICD-10-CM

## 2011-02-28 DIAGNOSIS — N259 Disorder resulting from impaired renal tubular function, unspecified: Secondary | ICD-10-CM

## 2011-02-28 DIAGNOSIS — R972 Elevated prostate specific antigen [PSA]: Secondary | ICD-10-CM

## 2011-02-28 DIAGNOSIS — I10 Essential (primary) hypertension: Secondary | ICD-10-CM

## 2011-02-28 DIAGNOSIS — I2510 Atherosclerotic heart disease of native coronary artery without angina pectoris: Secondary | ICD-10-CM

## 2011-02-28 NOTE — Progress Notes (Signed)
  Subjective:    Patient ID: Robert Ritter, male    DOB: 1920-04-15, 77 y.o.   MRN: [id number]  HPI The patient presents for a follow-up of  chronic hypertension, chronic dyslipidemia, CAD, GERD controlled with medicines     Review of Systems  Constitutional: Negative for appetite change, fatigue and unexpected weight change.  HENT: Negative for nosebleeds, congestion, sore throat, sneezing, trouble swallowing and neck pain.   Eyes: Negative for itching and visual disturbance.  Respiratory: Negative for cough.   Cardiovascular: Negative for chest pain, palpitations and leg swelling.  Gastrointestinal: Negative for nausea, diarrhea, blood in stool and abdominal distention.  Genitourinary: Negative for frequency and hematuria.  Musculoskeletal: Negative for back pain, joint swelling and gait problem.  Skin: Negative for rash.  Neurological: Negative for dizziness, tremors, speech difficulty and weakness.  Psychiatric/Behavioral: Negative for sleep disturbance, dysphoric mood and agitation. The patient is not nervous/anxious.        Objective:   Physical Exam  Constitutional: He is oriented to person, place, and time. He appears well-developed.  HENT:  Mouth/Throat: Oropharynx is clear and moist.  Eyes: Conjunctivae are normal. Pupils are equal, round, and reactive to light.  Neck: Normal range of motion. No JVD present. No thyromegaly present.  Cardiovascular: Normal rate, regular rhythm, normal heart sounds and intact distal pulses.  Exam reveals no gallop and no friction rub.   No murmur heard. Pulmonary/Chest: Effort normal and breath sounds normal. No respiratory distress. He has no wheezes. He has no rales. He exhibits no tenderness.  Abdominal: Soft. Bowel sounds are normal. He exhibits no distension and no mass. There is no tenderness. There is no rebound and no guarding.  Musculoskeletal: Normal range of motion. He exhibits no edema and no tenderness.  Lymphadenopathy:    He has no cervical adenopathy.  Neurological: He is alert and oriented to person, place, and time. He has normal reflexes. No cranial nerve deficit. He exhibits normal muscle tone. Coordination normal.  Skin: Skin is warm and dry. No rash noted.  Psychiatric: He has a normal mood and affect. His behavior is normal. Judgment and thought content normal.          Assessment & Plan:

## 2011-02-28 NOTE — Assessment & Plan Note (Signed)
Continue with current prescription therapy as reflected on the Med list.  

## 2011-02-28 NOTE — Assessment & Plan Note (Signed)
Watching  

## 2011-02-28 NOTE — Assessment & Plan Note (Signed)
Per Cardiology Continue with current prescription therapy as reflected on the Med list.

## 2011-03-21 ENCOUNTER — Ambulatory Visit (INDEPENDENT_AMBULATORY_CARE_PROVIDER_SITE_OTHER): Payer: Medicare Other | Admitting: *Deleted

## 2011-03-21 DIAGNOSIS — Z23 Encounter for immunization: Secondary | ICD-10-CM

## 2011-03-28 ENCOUNTER — Other Ambulatory Visit: Payer: Self-pay | Admitting: Internal Medicine

## 2011-03-29 LAB — POCT I-STAT, CHEM 8
Creatinine, Ser: 1.6 — ABNORMAL HIGH
Hemoglobin: 10.5 — ABNORMAL LOW
Potassium: 3.9
Sodium: 138

## 2011-03-29 LAB — DIFFERENTIAL
Eosinophils Absolute: 0.1
Lymphocytes Relative: 13
Lymphs Abs: 1.1
Neutro Abs: 6.5
Neutrophils Relative %: 77

## 2011-03-29 LAB — CBC
MCHC: 33.1
MCV: 89.4
Platelets: 168

## 2011-04-10 LAB — CBC
Hemoglobin: 11.2 — ABNORMAL LOW
MCHC: 32.6
RBC: 3.84 — ABNORMAL LOW

## 2011-04-11 LAB — CBC
Hemoglobin: 11.4 — ABNORMAL LOW
MCHC: 32.7
MCV: 88
RBC: 3.97 — ABNORMAL LOW

## 2011-04-13 LAB — CBC
MCHC: 32.7
MCV: 88.6
Platelets: 171
RDW: 15 — ABNORMAL HIGH
WBC: 5.7

## 2011-04-13 LAB — IRON AND TIBC: Iron: 47

## 2011-05-01 ENCOUNTER — Ambulatory Visit: Payer: Medicare Other | Admitting: Internal Medicine

## 2011-05-03 ENCOUNTER — Telehealth: Payer: Self-pay | Admitting: *Deleted

## 2011-05-03 NOTE — Telephone Encounter (Signed)
Pt left vm requesting OV ASAP to remove a skin tag/growth on his jaw that bleeds every time he shaves. I advised Eber Jones Geologist, engineering) to contact pt to arrange procedure visit.

## 2011-05-10 ENCOUNTER — Encounter: Payer: Self-pay | Admitting: Internal Medicine

## 2011-05-10 ENCOUNTER — Ambulatory Visit (INDEPENDENT_AMBULATORY_CARE_PROVIDER_SITE_OTHER): Payer: Medicare Other | Admitting: Internal Medicine

## 2011-05-10 VITALS — BP 130/60 | HR 68 | Temp 97.4°F | Resp 16 | Wt 159.0 lb

## 2011-05-10 DIAGNOSIS — D485 Neoplasm of uncertain behavior of skin: Secondary | ICD-10-CM

## 2011-05-10 DIAGNOSIS — M109 Gout, unspecified: Secondary | ICD-10-CM

## 2011-05-10 DIAGNOSIS — D489 Neoplasm of uncertain behavior, unspecified: Secondary | ICD-10-CM

## 2011-05-10 MED ORDER — PREDNISONE 10 MG PO TABS: ORAL_TABLET | ORAL | Status: AC

## 2011-05-10 NOTE — Patient Instructions (Signed)
Postprocedure instructions :    A Band-Aid should be  changed twice daily. You can take a shower tomorrow.  Keep the wounds clean. You can wash them with liquid soap and water. Pat dry with gauze or a Kleenex tissue  Before applying antibiotic ointment and a Band-Aid.   You need to report immediately  if fever, chills or any signs of infection develop.    The biopsy results should be available in 1 -2 weeks. 

## 2011-05-10 NOTE — Assessment & Plan Note (Signed)
Prednisone 10 mg: take 4 tabs a day x 3 days; then 3 tabs a day x 4 days; then 2 tabs a day x 4 days, then 1 tab a day x 6 days, then stop. Take pc.  Potential benefits of a short term steroid  use as well as potential risks  and complications were explained to the patient and were aknowledged.   

## 2011-05-10 NOTE — Progress Notes (Signed)
  Subjective:    Patient ID: Robert Ritter, male    DOB: [birth year], 77 y.o.   MRN: [id number]  HPI He is here for skin bx C/o gout pain in L knee and L ankle x 1 week   Review of Systems  Constitutional: Negative for fever and chills.  Musculoskeletal: Positive for arthralgias.       Objective:   Physical Exam  Constitutional: He appears well-developed.  HENT:  Head: Normocephalic.  Neck: Neck supple. No JVD present.  Pulmonary/Chest: He has no wheezes.  Abdominal: He exhibits no distension.  Musculoskeletal: He exhibits tenderness (mild w/ROM  -- L knee, ankle). He exhibits no edema.    Corn crowned lesion on L cheek 7x5 mm   Procedure Note :     Procedure :  Skin biopsy   Indication:   Suspicious lesion(s)   Risks including unsuccessful procedure , bleeding, infection, bruising, scar, a need for another complete procedure and others were explained to the patient in detail as well as the benefits. Informed consent was obtained and signed.   The patient was placed in a decubitus position.  Lesion #1 on   L cheek  measuring   7x5  mm   Skin over lesion #1  was prepped with Betadine and alcohol  and anesthetized with 1 cc of 2% lidocaine and epinephrine, using a 25-gauge 1 inch needle.  Shave biopsy with a sterile Dermablade was carried out in the usual fashion. It was attempted to biopsy with  1 mm free margins.  Hyfrecator was used to destroy the rest of the lesion potentially left behind and for hemostasis. Band-Aid was applied with antibiotic ointment.     Tolerated well. Complications none.      Postprocedure instructions :    A Band-Aid should be  changed twice daily. You can take a shower tomorrow.  Keep the wounds clean. You can wash them with liquid soap and water. Pat dry with gauze or a Kleenex tissue  Before applying antibiotic ointment and a Band-Aid.   You need to report immediately  if fever, chills or any signs of infection develop.    The  biopsy results should be available in 1 -2 weeks.        Assessment & Plan:

## 2011-05-10 NOTE — Assessment & Plan Note (Signed)
See bx °

## 2011-05-15 ENCOUNTER — Encounter: Payer: Medicare Other | Admitting: Cardiology

## 2011-05-15 ENCOUNTER — Ambulatory Visit: Payer: Medicare Other | Admitting: Cardiovascular Disease

## 2011-05-22 ENCOUNTER — Ambulatory Visit: Payer: Medicare Other | Admitting: Cardiovascular Disease

## 2011-05-22 ENCOUNTER — Encounter: Payer: Medicare Other | Admitting: Cardiology

## 2011-05-26 ENCOUNTER — Telehealth: Payer: Self-pay

## 2011-05-26 NOTE — Telephone Encounter (Signed)
Pt's spouse called stating pt gout sxs are improved but he has 28 tablets left of the prednisone. Should pt continue medication as prescribed or is it okay to discontinue?

## 2011-05-27 NOTE — Telephone Encounter (Signed)
Ok to d/c Thx 

## 2011-05-29 NOTE — Telephone Encounter (Signed)
Pt advised of MD's recommendation via VM

## 2011-06-16 ENCOUNTER — Other Ambulatory Visit (INDEPENDENT_AMBULATORY_CARE_PROVIDER_SITE_OTHER): Payer: Medicare Other

## 2011-06-16 DIAGNOSIS — E785 Hyperlipidemia, unspecified: Secondary | ICD-10-CM

## 2011-06-16 DIAGNOSIS — N259 Disorder resulting from impaired renal tubular function, unspecified: Secondary | ICD-10-CM

## 2011-06-16 DIAGNOSIS — I714 Abdominal aortic aneurysm, without rupture: Secondary | ICD-10-CM

## 2011-06-16 DIAGNOSIS — I2510 Atherosclerotic heart disease of native coronary artery without angina pectoris: Secondary | ICD-10-CM

## 2011-06-16 DIAGNOSIS — I10 Essential (primary) hypertension: Secondary | ICD-10-CM

## 2011-06-16 DIAGNOSIS — R972 Elevated prostate specific antigen [PSA]: Secondary | ICD-10-CM

## 2011-06-16 LAB — COMPREHENSIVE METABOLIC PANEL
ALT: 16 U/L (ref 0–53)
Alkaline Phosphatase: 62 U/L (ref 39–117)
Sodium: 142 mEq/L (ref 135–145)
Total Bilirubin: 0.7 mg/dL (ref 0.3–1.2)
Total Protein: 7.1 g/dL (ref 6.0–8.3)

## 2011-06-16 LAB — PSA: PSA: 13.73 ng/mL — ABNORMAL HIGH (ref 0.10–4.00)

## 2011-06-16 LAB — LIPID PANEL
HDL: 42 mg/dL (ref >39.00)
LDL Cholesterol: 60 mg/dL (ref 0–99)
Total CHOL/HDL Ratio: 3
VLDL: 15 mg/dL (ref 0.0–40.0)

## 2011-06-16 LAB — TSH: TSH: 1.8 u[IU]/mL (ref 0.35–5.50)

## 2011-06-23 ENCOUNTER — Encounter: Payer: Self-pay | Admitting: Internal Medicine

## 2011-06-23 ENCOUNTER — Ambulatory Visit (INDEPENDENT_AMBULATORY_CARE_PROVIDER_SITE_OTHER): Payer: Medicare Other | Admitting: Internal Medicine

## 2011-06-23 DIAGNOSIS — I10 Essential (primary) hypertension: Secondary | ICD-10-CM

## 2011-06-23 DIAGNOSIS — D485 Neoplasm of uncertain behavior of skin: Secondary | ICD-10-CM

## 2011-06-23 DIAGNOSIS — M109 Gout, unspecified: Secondary | ICD-10-CM

## 2011-06-23 DIAGNOSIS — I2510 Atherosclerotic heart disease of native coronary artery without angina pectoris: Secondary | ICD-10-CM

## 2011-06-23 DIAGNOSIS — R972 Elevated prostate specific antigen [PSA]: Secondary | ICD-10-CM

## 2011-06-23 DIAGNOSIS — E785 Hyperlipidemia, unspecified: Secondary | ICD-10-CM

## 2011-06-23 DIAGNOSIS — N259 Disorder resulting from impaired renal tubular function, unspecified: Secondary | ICD-10-CM

## 2011-06-23 NOTE — Assessment & Plan Note (Signed)
Skin bx --- Face R cheek, nose

## 2011-06-23 NOTE — Assessment & Plan Note (Signed)
PSA 11-13, no changes s/p several bx by Dr Earlene Plater Will watch

## 2011-06-23 NOTE — Assessment & Plan Note (Signed)
Continue with current prescription therapy as reflected on the Med list. Mainain a good diet

## 2011-06-23 NOTE — Progress Notes (Signed)
  Subjective:    Patient ID: Robert Ritter, male    DOB: 12-17-1919, 77 y.o.   MRN: [id number]  HPI  The patient presents for a follow-up of  chronic hypertension, chronic dyslipidemia, gout controlled with medicines. C/o skin growths    Review of Systems  Constitutional: Negative for appetite change, fatigue and unexpected weight change.  HENT: Negative for nosebleeds, congestion, sore throat, sneezing, trouble swallowing and neck pain.   Eyes: Negative for itching and visual disturbance.  Respiratory: Negative for cough.   Cardiovascular: Negative for chest pain, palpitations and leg swelling.  Gastrointestinal: Negative for nausea, diarrhea, blood in stool and abdominal distention.  Genitourinary: Negative for frequency and hematuria.  Musculoskeletal: Positive for joint swelling, arthralgias and gait problem. Negative for back pain.       Stiff  Skin: Negative for rash.  Neurological: Negative for dizziness, tremors, speech difficulty and weakness.  Psychiatric/Behavioral: Negative for suicidal ideas, sleep disturbance, dysphoric mood and agitation. The patient is not nervous/anxious.        Objective:   Physical Exam  Constitutional: He is oriented to person, place, and time. He appears well-developed.  HENT:  Mouth/Throat: Oropharynx is clear and moist.  Eyes: Conjunctivae are normal. Pupils are equal, round, and reactive to light.  Neck: Normal range of motion. No JVD present. No thyromegaly present.  Cardiovascular: Normal rate, regular rhythm, normal heart sounds and intact distal pulses.  Exam reveals no gallop and no friction rub.   No murmur heard. Pulmonary/Chest: Effort normal and breath sounds normal. No respiratory distress. He has no wheezes. He has no rales. He exhibits no tenderness.  Abdominal: Soft. Bowel sounds are normal. He exhibits no distension and no mass. There is no tenderness. There is no rebound and no guarding.  Musculoskeletal: He exhibits no  edema and no tenderness.       ROM is decreased  Lymphadenopathy:    He has no cervical adenopathy.  Neurological: He is alert and oriented to person, place, and time. He has normal reflexes. No cranial nerve deficit. He exhibits normal muscle tone. Coordination normal.  Skin: Skin is warm and dry. No rash noted.       Nose and L cheek growth  Psychiatric: He has a normal mood and affect. His behavior is normal. Judgment and thought content normal.    . Lab Results  Component Value Date   WBC 7.4 07/26/2010   HGB 12.3* 07/26/2010   HCT 37.1* 07/26/2010   PLT 171.0 07/26/2010   GLUCOSE 98 06/16/2011   CHOL 117 06/16/2011   TRIG 75.0 06/16/2011   HDL 42.00 06/16/2011   LDLCALC 60 06/16/2011   ALT 16 06/16/2011   AST 23 06/16/2011   NA 142 06/16/2011   K 4.0 06/16/2011   CL 103 06/16/2011   CREATININE 1.5 06/16/2011   BUN 32* 06/16/2011   CO2 30 06/16/2011   TSH 1.80 06/16/2011   PSA 13.73* 06/16/2011         Assessment & Plan:

## 2011-06-23 NOTE — Assessment & Plan Note (Signed)
Continue with current prescription therapy as reflected on the Med list.  

## 2011-06-28 ENCOUNTER — Ambulatory Visit (INDEPENDENT_AMBULATORY_CARE_PROVIDER_SITE_OTHER): Payer: Medicare Other | Admitting: *Deleted

## 2011-06-28 DIAGNOSIS — Z23 Encounter for immunization: Secondary | ICD-10-CM

## 2011-07-12 ENCOUNTER — Ambulatory Visit (INDEPENDENT_AMBULATORY_CARE_PROVIDER_SITE_OTHER): Payer: Medicare Other | Admitting: Cardiovascular Disease

## 2011-07-12 ENCOUNTER — Encounter (INDEPENDENT_AMBULATORY_CARE_PROVIDER_SITE_OTHER): Payer: Medicare Other | Admitting: Cardiology

## 2011-07-12 ENCOUNTER — Encounter: Payer: Self-pay | Admitting: Cardiovascular Disease

## 2011-07-12 DIAGNOSIS — I723 Aneurysm of iliac artery: Secondary | ICD-10-CM

## 2011-07-12 DIAGNOSIS — I714 Abdominal aortic aneurysm, without rupture: Secondary | ICD-10-CM

## 2011-07-12 DIAGNOSIS — E785 Hyperlipidemia, unspecified: Secondary | ICD-10-CM

## 2011-07-12 DIAGNOSIS — I2510 Atherosclerotic heart disease of native coronary artery without angina pectoris: Secondary | ICD-10-CM

## 2011-07-12 NOTE — Assessment & Plan Note (Signed)
Lipids are excellent with an LDL of 60 and a total cholesterol of 117. He was given samples of Vytorin today.

## 2011-07-12 NOTE — Progress Notes (Signed)
HPI:  This is an 78 year old male presenting for followup evaluation. He is followed for coronary artery disease status post remote coronary bypass surgery and abdominal aortic aneurysm. He has undergone serial abdominal duplex scanning for aneurysm followup and underwent a study this morning. This demonstrated a slight increase in his infrarenal fusiform AAA, now 5.2 x 5.2 cm. At the time of his last study 6 months ago the maximum dimensions were 5.1 x 4.9 cm. The patient has no abdominal pain, chest pain, dyspnea, or edema. He denies palpitations, lightheadedness, or syncope. He does complain of gait instability, but he has not required a cane or walker to date.  Outpatient Encounter Prescriptions as of 07/12/2011  Medication Sig Dispense Refill  . aspirin 325 MG tablet Take 162.5 mg by mouth daily. Take 1/2 tablet      . bimatoprost (LUMIGAN) 0.03 % ophthalmic drops Place 1 drop into both eyes at bedtime.        Marland Kitchen ezetimibe-simvastatin (VYTORIN) 10-40 MG per tablet Take 1 tablet by mouth at bedtime.        . fish oil-omega-3 fatty acids 1000 MG capsule Take 1 g by mouth 2 (two) times daily.      . furosemide (LASIX) 80 MG tablet Take 40 mg by mouth 2 (two) times daily.       . nitroGLYCERIN (NITROSTAT) 0.4 MG SL tablet Place 0.4 mg under the tongue as needed.        . NON FORMULARY Place 1 drop into both eyes 2 (two) times daily. CAMAGAM EYE DROPS       . predniSONE (DELTASONE) 10 MG tablet Take 10 mg by mouth as needed. Take medication for gout flare ups      . terazosin (HYTRIN) 5 MG capsule Take 5 mg by mouth daily.        Marland Kitchen DISCONTD: allopurinol (ZYLOPRIM) 100 MG tablet TAKE 2 TABLETS BY MOUTH EVERY DAY  60 tablet  12  . DISCONTD: promethazine (PHENERGAN) 25 MG tablet Take 25 mg by mouth 3 (three) times daily as needed.          Allergies  Allergen Reactions  . Sulfatol C     Past Medical History  Diagnosis Date  . CAD (coronary artery disease)     S/P CABG and multiple PCI's  .  Hyperlipidemia   . HTN (hypertension)   . AAA (abdominal aortic aneurysm)     5 cm on 2011 assessment  . Gout 2009  . Renal insufficiency   . Elevated PSA     Dr Earlene Plater    ROS: Negative except as per HPI  BP 118/56  Pulse 52  Ht 5\' 10"  (1.778 m)  Wt 72.938 kg (160 lb 12.8 oz)  BMI 23.07 kg/m2  PHYSICAL EXAM: Pt is alert and oriented, elderly male in NAD HEENT: normal Neck: JVP - normal, carotids 2+= without bruits Lungs: CTA bilaterally CV: bradycardic and regular with a grade 2/6 systolic murmur at the right upper sternal border Abd: soft, NT, Positive BS, the abdominal aorta is easily palpable and enlarged Ext: no C/C/E, distal pulses intact and equal Skin: warm/dry no rash  EKG:  Sinus bradycardia 52 beats per minute, age indeterminate septal infarct.  ASSESSMENT AND PLAN:

## 2011-07-12 NOTE — Assessment & Plan Note (Signed)
The patient has no symptoms of angina. He is on appropriate medications with aspirin and a statin drug. We'll continue current therapy. His blood pressure is in the low-normal range without the use of antihypertensive medications.

## 2011-07-12 NOTE — Patient Instructions (Signed)
Your physician wants you to follow-up in: 6 MONTHS.  You will receive a reminder letter in the mail two months in advance. If you don't receive a letter, please call our office to schedule the follow-up appointment.  Your physician has requested that you have an abdominal aorta duplex in 6 MONTHS. During this test, an ultrasound is used to evaluate the aorta. Allow 30 minutes for this exam. Do not eat after midnight the day before and avoid carbonated beverages  Your physician recommends that you continue on your current medications as directed. Please refer to the Current Medication list given to you today.

## 2011-07-12 NOTE — Assessment & Plan Note (Signed)
Recommend followup duplex scan in 6 months.

## 2011-09-16 ENCOUNTER — Encounter (HOSPITAL_COMMUNITY): Payer: Self-pay | Admitting: *Deleted

## 2011-09-16 ENCOUNTER — Emergency Department (HOSPITAL_COMMUNITY)
Admission: EM | Admit: 2011-09-16 | Discharge: 2011-09-16 | Disposition: A | Discharge status: Hospice | Payer: Medicare Other | Attending: Emergency Medicine | Admitting: Emergency Medicine

## 2011-09-16 ENCOUNTER — Emergency Department (HOSPITAL_COMMUNITY): Payer: Medicare Other

## 2011-09-16 DIAGNOSIS — E785 Hyperlipidemia, unspecified: Secondary | ICD-10-CM | POA: Insufficient documentation

## 2011-09-16 DIAGNOSIS — I714 Abdominal aortic aneurysm, without rupture, unspecified: Secondary | ICD-10-CM | POA: Insufficient documentation

## 2011-09-16 DIAGNOSIS — R112 Nausea with vomiting, unspecified: Secondary | ICD-10-CM | POA: Insufficient documentation

## 2011-09-16 DIAGNOSIS — I10 Essential (primary) hypertension: Secondary | ICD-10-CM | POA: Insufficient documentation

## 2011-09-16 DIAGNOSIS — I2510 Atherosclerotic heart disease of native coronary artery without angina pectoris: Secondary | ICD-10-CM | POA: Insufficient documentation

## 2011-09-16 DIAGNOSIS — R197 Diarrhea, unspecified: Secondary | ICD-10-CM

## 2011-09-16 LAB — COMPREHENSIVE METABOLIC PANEL
ALT: 11 U/L (ref 0–53)
Alkaline Phosphatase: 54 U/L (ref 39–117)
BUN: 27 mg/dL — ABNORMAL HIGH (ref 6–23)
CO2: 25 mEq/L (ref 19–32)
GFR calc Af Amer: 44 mL/min — ABNORMAL LOW (ref >90)
GFR calc non Af Amer: 38 mL/min — ABNORMAL LOW (ref >90)
Glucose, Bld: 124 mg/dL — ABNORMAL HIGH (ref 70–99)
Potassium: 3.7 mEq/L (ref 3.5–5.1)
Total Bilirubin: 0.5 mg/dL (ref 0.3–1.2)
Total Protein: 6.4 g/dL (ref 6.0–8.3)

## 2011-09-16 LAB — URINALYSIS, ROUTINE W REFLEX MICROSCOPIC
Bilirubin Urine: NEGATIVE
Ketones, ur: NEGATIVE mg/dL
Leukocytes, UA: NEGATIVE
Nitrite: NEGATIVE
Protein, ur: 30 mg/dL — AB
Urobilinogen, UA: 0.2 mg/dL (ref 0.0–1.0)
pH: 5.5 (ref 5.0–8.0)

## 2011-09-16 LAB — CBC
Hemoglobin: 11.7 g/dL — ABNORMAL LOW (ref 13.0–17.0)
MCH: 28.9 pg (ref 26.0–34.0)
MCV: 90.6 fL (ref 78.0–100.0)
RBC: 4.05 MIL/uL — ABNORMAL LOW (ref 4.22–5.81)
WBC: 7.1 10*3/uL (ref 4.0–10.5)

## 2011-09-16 LAB — DIFFERENTIAL
Eosinophils Absolute: 0.1 10*3/uL (ref 0.0–0.7)
Lymphocytes Relative: 6 % — ABNORMAL LOW (ref 12–46)
Lymphs Abs: 0.4 10*3/uL — ABNORMAL LOW (ref 0.7–4.0)
Monocytes Relative: 5 % (ref 3–12)
Neutrophils Relative %: 88 % — ABNORMAL HIGH (ref 43–77)

## 2011-09-16 MED ORDER — SODIUM CHLORIDE 0.9 % IV SOLN
Freq: Once | INTRAVENOUS | Status: AC
  Administered 2011-09-16: 05:00:00 via INTRAVENOUS

## 2011-09-16 MED ORDER — ONDANSETRON HCL 4 MG/2ML IJ SOLN
4.0000 mg | Freq: Once | INTRAMUSCULAR | Status: AC
  Administered 2011-09-16: 4 mg via INTRAVENOUS
  Filled 2011-09-16: qty 2

## 2011-09-16 MED ORDER — SODIUM CHLORIDE 0.9 % IV BOLUS (SEPSIS)
500.0000 mL | Freq: Once | INTRAVENOUS | Status: AC
  Administered 2011-09-16: 500 mL via INTRAVENOUS

## 2011-09-16 MED ORDER — ONDANSETRON 4 MG PO TBDP: 4.0000 mg | ORAL_TABLET | Freq: Three times a day (TID) | ORAL | Status: AC | PRN

## 2011-09-16 MED ORDER — ONDANSETRON HCL 4 MG/2ML IJ SOLN
4.0000 mg | Freq: Once | INTRAMUSCULAR | Status: AC
  Administered 2011-09-16: 4 mg via INTRAVENOUS
  Filled 2011-09-16 (×2): qty 2

## 2011-09-16 NOTE — ED Provider Notes (Signed)
History     CSN: [id number]  Arrival date & time 09/16/11  0327   First MD Initiated Contact with Patient 09/16/11 0327      Chief Complaint  Patient presents with  . Hematemesis    (Consider location/radiation/quality/duration/timing/severity/associated sxs/prior treatment) HPI Comments: 78 year old male with a history of abdominal aortic aneurysm, renal insufficiency, hypertension and coronary disease status post CABG in the 1980s. He presents with a complaint of diarrhea for several days which is watery, nonbloody, nonpainful and not associated with abdominal cramping. This evening he developed nausea and vomiting and has had approximately 6 episodes of emesis, appearing to be dark brown or black and having food particles. He has been able to eat until this evening and had sauerkraut yesterday evening. He denies fevers, chills, rash, swelling, dysuria, chest pain, shortness of breath, abdominal pain, back pain, weakness or fatigue. He does state that he feels like his mouth is dry.  The history is provided by the patient and the EMS personnel.    Past Medical History  Diagnosis Date  . CAD (coronary artery disease)     S/P CABG and multiple PCI's  . Hyperlipidemia   . HTN (hypertension)   . AAA (abdominal aortic aneurysm)     5 cm on 2011 assessment  . Gout 2009  . Renal insufficiency   . Elevated PSA     Dr Earlene Plater    Past Surgical History  Procedure Date  . Coronary artery bypass graft 1981    Family History  Problem Relation Age of Onset  . Hypertension Other     History  Substance Use Topics  . Smoking status: Former Games developer  . Smokeless tobacco: Not on file  . Alcohol Use: No      Review of Systems  All other systems reviewed and are negative.    Allergies  Sulfa antibiotics and Sulfatol c  Home Medications   Current Outpatient Rx  Name Route Sig Dispense Refill  . ALLOPURINOL 100 MG PO TABS Oral Take 200 mg by mouth daily.    . ASPIRIN 325 MG PO  TABS Oral Take 162.5 mg by mouth daily. Take 1/2 tablet    . BIMATOPROST 0.03 % OP SOLN Both Eyes Place 1 drop into both eyes at bedtime.      Marland Kitchen EZETIMIBE-SIMVASTATIN 10-40 MG PO TABS Oral Take 1 tablet by mouth at bedtime.      . OMEGA-3 FATTY ACIDS 1000 MG PO CAPS Oral Take 1 g by mouth 2 (two) times daily.    . FUROSEMIDE 80 MG PO TABS Oral Take 40 mg by mouth 2 (two) times daily.     . TELMISARTAN 80 MG PO TABS Oral Take 80 mg by mouth daily.    Marland Kitchen TERAZOSIN HCL 5 MG PO CAPS Oral Take 5 mg by mouth daily.      Marland Kitchen TIMOLOL MALEATE 0.5 % OP SOLN Both Eyes Place 1 drop into both eyes 2 (two) times daily.     Marland Kitchen NITROGLYCERIN 0.4 MG SL SUBL Sublingual Place 0.4 mg under the tongue as needed. Chest pain    . ONDANSETRON 4 MG PO TBDP Oral Take 1 tablet (4 mg total) by mouth every 8 (eight) hours as needed for nausea. 10 tablet 0  . ONDANSETRON HCL 4 MG/2ML IJ SOLN Intravenous Inject 4 mg into the vein once.    Marland Kitchen PREDNISONE 10 MG PO TABS Oral Take 10 mg by mouth as needed. Take medication for gout flare ups  BP 105/44  Pulse 55  Temp(Src) 98.2 F (36.8 C) (Oral)  Resp 14  SpO2 99%  Physical Exam  Nursing note and vitals reviewed. Constitutional: He appears well-developed and well-nourished. No distress.  HENT:  Head: Normocephalic and atraumatic.  Mouth/Throat: No oropharyngeal exudate.       Oropharynx clear, mucous membranes mildly dehydrated  Eyes: Conjunctivae and EOM are normal. Pupils are equal, round, and reactive to light. Right eye exhibits no discharge. Left eye exhibits no discharge. No scleral icterus.  Neck: Normal range of motion. Neck supple. No JVD present. No thyromegaly present.  Cardiovascular: Normal rate, regular rhythm and intact distal pulses.  Exam reveals no gallop and no friction rub.   Murmur ( Systolic) heard. Pulmonary/Chest: Effort normal and breath sounds normal. No respiratory distress. He has no wheezes. He has no rales.  Abdominal: Soft. Bowel sounds  are normal. He exhibits no distension and no mass. There is no tenderness.  Musculoskeletal: Normal range of motion. He exhibits no edema and no tenderness.  Lymphadenopathy:    He has no cervical adenopathy.  Neurological: He is alert. Coordination normal.  Skin: Skin is warm and dry. No rash noted. No erythema.  Psychiatric: He has a normal mood and affect. His behavior is normal.    ED Course  Procedures (including critical care time)  Labs Reviewed  CBC - Abnormal; Notable for the following:    RBC 4.05 (*)    Hemoglobin 11.7 (*)    HCT 36.7 (*)    Platelets 129 (*)    All other components within normal limits  DIFFERENTIAL - Abnormal; Notable for the following:    Neutrophils Relative 88 (*)    Lymphocytes Relative 6 (*)    Lymphs Abs 0.4 (*)    All other components within normal limits  COMPREHENSIVE METABOLIC PANEL - Abnormal; Notable for the following:    Glucose, Bld 124 (*)    BUN 27 (*)    Creatinine, Ser 1.54 (*)    Albumin 3.3 (*)    GFR calc non Af Amer 38 (*)    GFR calc Af Amer 44 (*)    All other components within normal limits  URINALYSIS, ROUTINE W REFLEX MICROSCOPIC - Abnormal; Notable for the following:    Protein, ur 30 (*)    All other components within normal limits  URINE MICROSCOPIC-ADD ON - Abnormal; Notable for the following:    Casts HYALINE CASTS (*)    All other components within normal limits   Dg Abd Acute W/chest  09/16/2011  *RADIOLOGY REPORT*  Clinical Data: Nausea, vomiting, diarrhea, history coronary disease, hypertension, abdominal aortic aneurysm  ACUTE ABDOMEN SERIES (ABDOMEN 2 VIEW & CHEST 1 VIEW)  Comparison: Chest radiograph 04/15/2004  Findings: Upper normal heart size post median sternotomy. Tortuous aorta. Pulmonary vascularity normal. Lungs appear emphysematous but clear. Scattered atherosclerotic calcifications. Normal bowel gas pattern. No bowel dilatation, bowel wall thickening, or free intraperitoneal air. Diffuse osseous  demineralization. No urinary tract calcification.  IMPRESSION: No acute abnormalities. Pulmonary emphysematous changes.  Original Report Authenticated By: Lollie Marrow, M.D.     1. Nausea vomiting and diarrhea       MDM  Patient appears dehydrated, vital signs otherwise unremarkable at this time, proceed with workup for her diarrhea, vomiting illness, urinalysis, fluids, medications  Laboratory review shows a baseline creatinine of 1.5, BUN slightly elevated consistent with dehydration, slightly elevated specific gravity, normal blood counts including hemoglobin and white blood cells. Acute abdominal series is  negative, patient has had no further diarrhea or vomiting since arrival and has had significant improvement in his symptoms after Zofran and IV fluids. He has tolerated fluids and food by mouth prior to discharge and will followup with family doctor per recommendations.  Discharge Prescriptions include:  #1 Zofran      Vida Roller, MD 09/16/11 [phone]

## 2011-09-16 NOTE — Discharge Instructions (Signed)
Please take the zofran as prescribed for nausea, drink plenty of fluids - follow the attached dietary guidelines.  Return to the ER for Chest Pain, difficutly breathing, high fevers, worsening vomiting or diarrhea.  .B.R.A.T. Diet Your doctor has recommended the B.R.A.T. diet for you or your child until the condition improves. This is often used to help control diarrhea and vomiting symptoms. If you or your child can tolerate clear liquids, you may have:  Bananas.   Rice.   Applesauce.   Toast (and other simple starches such as crackers, potatoes, noodles).  Be sure to avoid dairy products, meats, and fatty foods until symptoms are better. Fruit juices such as apple, grape, and prune juice can make diarrhea worse. Avoid these. Continue this diet for 2 days or as instructed by your caregiver. Document Released: 06/12/2005 Document Revised: 06/01/2011 Document Reviewed: 11/29/2006 [facility] Patient Information 2012 [place], [place].

## 2011-09-16 NOTE — ED Notes (Signed)
Pt placed on monitor and vitals taken.

## 2011-09-16 NOTE — ED Notes (Signed)
PT states that he is feeling much better.  VS stable.

## 2011-09-16 NOTE — ED Notes (Addendum)
Pt brought from home by EMS.  He has been having diarrhea for several days and this morning he began vomiting.  Pt denies pain.  Pt vomited 5 times with ems.  SL to R FA 18 G, given 400 of fluid for initial bp of 86 palp, 4 mg of zofran.  Pt ao x 4.  BP elevated to 122/55.  PT last ate yesterday afternoon. CBG was 115.

## 2011-09-16 NOTE — ED Notes (Signed)
Pt has not vomited since arrival.  Resting comfortably.

## 2011-09-29 ENCOUNTER — Ambulatory Visit (INDEPENDENT_AMBULATORY_CARE_PROVIDER_SITE_OTHER): Payer: Medicare Other | Admitting: Internal Medicine

## 2011-09-29 ENCOUNTER — Encounter: Payer: Self-pay | Admitting: Internal Medicine

## 2011-09-29 VITALS — BP 122/66 | HR 68 | Temp 97.6°F | Resp 16 | Wt 158.0 lb

## 2011-09-29 DIAGNOSIS — E785 Hyperlipidemia, unspecified: Secondary | ICD-10-CM

## 2011-09-29 DIAGNOSIS — N259 Disorder resulting from impaired renal tubular function, unspecified: Secondary | ICD-10-CM

## 2011-09-29 DIAGNOSIS — I714 Abdominal aortic aneurysm, without rupture: Secondary | ICD-10-CM

## 2011-09-29 DIAGNOSIS — I10 Essential (primary) hypertension: Secondary | ICD-10-CM

## 2011-09-29 DIAGNOSIS — I2510 Atherosclerotic heart disease of native coronary artery without angina pectoris: Secondary | ICD-10-CM

## 2011-09-29 DIAGNOSIS — M109 Gout, unspecified: Secondary | ICD-10-CM

## 2011-09-29 NOTE — Assessment & Plan Note (Signed)
Continue with current prescription therapy as reflected on the Med list.  

## 2011-09-29 NOTE — Assessment & Plan Note (Signed)
Continue with current prescription therapy and diet as reflected on the Med list.

## 2011-09-29 NOTE — Progress Notes (Signed)
Patient ID: Robert Ritter, male   DOB: [birth year], 78 y.o.   MRN: [id number]  Subjective:    Patient ID: Robert Ritter, male    DOB: 03/23/1920, 78 y.o.   MRN: [id number]  HPI  The patient presents for a follow-up of  chronic hypertension, chronic dyslipidemia, gout controlled with medicines. He had a stomach virus  BP Readings from Last 3 Encounters:  09/29/11 122/66  09/16/11 108/46  07/12/11 118/56   Wt Readings from Last 3 Encounters:  09/29/11 158 lb (71.668 kg)  07/12/11 160 lb 12.8 oz (72.938 kg)  06/23/11 164 lb (74.39 kg)        Review of Systems  Constitutional: Negative for appetite change, fatigue and unexpected weight change.  HENT: Negative for nosebleeds, congestion, sore throat, sneezing, trouble swallowing and neck pain.   Eyes: Negative for itching and visual disturbance.  Respiratory: Negative for cough.   Cardiovascular: Negative for chest pain, palpitations and leg swelling.  Gastrointestinal: Negative for nausea, diarrhea, blood in stool and abdominal distention.  Genitourinary: Negative for frequency and hematuria.  Musculoskeletal: Positive for joint swelling, arthralgias and gait problem. Negative for back pain.       Stiff  Skin: Negative for rash.  Neurological: Negative for dizziness, tremors, speech difficulty and weakness.  Psychiatric/Behavioral: Negative for suicidal ideas, sleep disturbance, dysphoric mood and agitation. The patient is not nervous/anxious.        Objective:   Physical Exam  Constitutional: He is oriented to person, place, and time. He appears well-developed.  HENT:  Mouth/Throat: Oropharynx is clear and moist.  Eyes: Conjunctivae are normal. Pupils are equal, round, and reactive to light.  Neck: Normal range of motion. No JVD present. No thyromegaly present.  Cardiovascular: Normal rate, regular rhythm, normal heart sounds and intact distal pulses.  Exam reveals no gallop and no friction rub.   No murmur  heard. Pulmonary/Chest: Effort normal and breath sounds normal. No respiratory distress. He has no wheezes. He has no rales. He exhibits no tenderness.  Abdominal: Soft. Bowel sounds are normal. He exhibits no distension and no mass. There is no tenderness. There is no rebound and no guarding.  Musculoskeletal: He exhibits no edema and no tenderness.       ROM is decreased  Lymphadenopathy:    He has no cervical adenopathy.  Neurological: He is alert and oriented to person, place, and time. He has normal reflexes. No cranial nerve deficit. He exhibits normal muscle tone. Coordination normal.  Skin: Skin is warm and dry. No rash noted.       Nose and L cheek growth  Psychiatric: He has a normal mood and affect. His behavior is normal. Judgment and thought content normal.    . Lab Results  Component Value Date   WBC 7.1 09/16/2011   HGB 11.7* 09/16/2011   HCT 36.7* 09/16/2011   PLT 129* 09/16/2011   GLUCOSE 124* 09/16/2011   CHOL 117 06/16/2011   TRIG 75.0 06/16/2011   HDL 42.00 06/16/2011   LDLCALC 60 06/16/2011   ALT 11 09/16/2011   AST 16 09/16/2011   NA 136 09/16/2011   K 3.7 09/16/2011   CL 100 09/16/2011   CREATININE 1.54* 09/16/2011   BUN 27* 09/16/2011   CO2 25 09/16/2011   TSH 1.80 06/16/2011   PSA 13.73* 06/16/2011         Assessment & Plan:

## 2011-10-06 ENCOUNTER — Other Ambulatory Visit: Payer: Self-pay | Admitting: Internal Medicine

## 2011-11-06 ENCOUNTER — Other Ambulatory Visit (HOSPITAL_COMMUNITY): Payer: Self-pay | Admitting: *Deleted

## 2011-11-07 ENCOUNTER — Encounter (HOSPITAL_COMMUNITY)
Admission: RE | Admit: 2011-11-07 | Discharge: 2011-11-07 | Disposition: A | Discharge status: Home / Self Care | Payer: Medicare Other | Source: Ambulatory Visit | Attending: Nephrology | Admitting: Nephrology

## 2011-11-07 DIAGNOSIS — D638 Anemia in other chronic diseases classified elsewhere: Secondary | ICD-10-CM | POA: Insufficient documentation

## 2011-11-07 DIAGNOSIS — N183 Chronic kidney disease, stage 3 unspecified: Secondary | ICD-10-CM | POA: Insufficient documentation

## 2011-11-07 MED ORDER — EPOETIN ALFA 20000 UNIT/ML IJ SOLN
INTRAMUSCULAR | Status: AC
  Filled 2011-11-07: qty 1

## 2011-11-07 MED ORDER — EPOETIN ALFA 20000 UNIT/ML IJ SOLN
20000.0000 [IU] | INTRAMUSCULAR | Status: DC
  Administered 2011-11-07: 20000 [IU] via SUBCUTANEOUS

## 2011-12-05 ENCOUNTER — Encounter (HOSPITAL_COMMUNITY): Payer: Medicare Other

## 2011-12-11 ENCOUNTER — Encounter (HOSPITAL_COMMUNITY)
Admission: RE | Admit: 2011-12-11 | Discharge: 2011-12-11 | Disposition: A | Discharge status: Home / Self Care | Payer: Medicare Other | Source: Ambulatory Visit | Attending: Nephrology | Admitting: Nephrology

## 2011-12-11 DIAGNOSIS — N183 Chronic kidney disease, stage 3 unspecified: Secondary | ICD-10-CM | POA: Insufficient documentation

## 2011-12-11 DIAGNOSIS — D638 Anemia in other chronic diseases classified elsewhere: Secondary | ICD-10-CM | POA: Insufficient documentation

## 2011-12-11 LAB — POCT HEMOGLOBIN-HEMACUE: Hemoglobin: 11.4 g/dL — ABNORMAL LOW (ref 13.0–17.0)

## 2011-12-11 MED ORDER — EPOETIN ALFA 20000 UNIT/ML IJ SOLN
20000.0000 [IU] | INTRAMUSCULAR | Status: DC
  Administered 2011-12-11: 20000 [IU] via SUBCUTANEOUS
  Filled 2011-12-11: qty 1

## 2011-12-18 ENCOUNTER — Other Ambulatory Visit (HOSPITAL_COMMUNITY): Payer: Self-pay | Admitting: *Deleted

## 2011-12-19 ENCOUNTER — Encounter (HOSPITAL_COMMUNITY)
Admission: RE | Admit: 2011-12-19 | Discharge: 2011-12-19 | Disposition: A | Discharge status: Home / Self Care | Payer: Medicare Other | Source: Ambulatory Visit | Attending: Nephrology | Admitting: Nephrology

## 2011-12-19 MED ORDER — SODIUM CHLORIDE 0.9 % IV SOLN
Freq: Once | INTRAVENOUS | Status: AC
  Administered 2011-12-19: 11:00:00 via INTRAVENOUS

## 2011-12-19 MED ORDER — FERUMOXYTOL INJECTION 510 MG/17 ML
510.0000 mg | Freq: Once | INTRAVENOUS | Status: AC
  Administered 2011-12-19: 510 mg via INTRAVENOUS
  Filled 2011-12-19: qty 17

## 2012-01-05 ENCOUNTER — Ambulatory Visit (INDEPENDENT_AMBULATORY_CARE_PROVIDER_SITE_OTHER): Payer: Medicare Other | Admitting: Internal Medicine

## 2012-01-05 ENCOUNTER — Encounter: Payer: Self-pay | Admitting: Internal Medicine

## 2012-01-05 VITALS — BP 112/62 | HR 80 | Temp 97.6°F | Resp 16 | Wt 153.0 lb

## 2012-01-05 DIAGNOSIS — N259 Disorder resulting from impaired renal tubular function, unspecified: Secondary | ICD-10-CM

## 2012-01-05 DIAGNOSIS — I2510 Atherosclerotic heart disease of native coronary artery without angina pectoris: Secondary | ICD-10-CM

## 2012-01-05 DIAGNOSIS — I10 Essential (primary) hypertension: Secondary | ICD-10-CM

## 2012-01-05 DIAGNOSIS — E785 Hyperlipidemia, unspecified: Secondary | ICD-10-CM

## 2012-01-05 NOTE — Assessment & Plan Note (Signed)
Continue with current prescription therapy as reflected on the Med list.  

## 2012-01-05 NOTE — Progress Notes (Signed)
Patient ID: Robert Ritter, male   DOB: [birth year], 78 y.o.   MRN: [id number]

## 2012-01-05 NOTE — Progress Notes (Signed)
   Subjective:    Patient ID: Robert Ritter, male    DOB: 1919-12-24, 78 y.o.   MRN: [id number]  HPI  The patient presents for a follow-up of  chronic hypertension, chronic dyslipidemia, gout controlled with medicines. Doing well  BP Readings from Last 3 Encounters:  01/05/12 112/62  12/19/11 119/57  12/11/11 172/64   Wt Readings from Last 3 Encounters:  01/05/12 153 lb (69.4 kg)  12/19/11 150 lb (68.04 kg)  11/07/11 150 lb (68.04 kg)        Review of Systems  Constitutional: Negative for appetite change, fatigue and unexpected weight change.  HENT: Negative for nosebleeds, congestion, sore throat, sneezing, trouble swallowing and neck pain.   Eyes: Negative for itching and visual disturbance.  Respiratory: Negative for cough.   Cardiovascular: Negative for chest pain, palpitations and leg swelling.  Gastrointestinal: Negative for nausea, diarrhea, blood in stool and abdominal distention.  Genitourinary: Negative for frequency and hematuria.  Musculoskeletal: Positive for joint swelling, arthralgias and gait problem. Negative for back pain.       Stiff  Skin: Negative for rash.  Neurological: Negative for dizziness, tremors, speech difficulty and weakness.  Psychiatric/Behavioral: Negative for suicidal ideas, disturbed wake/sleep cycle, dysphoric mood and agitation. The patient is not nervous/anxious.        Objective:   Physical Exam  Constitutional: He is oriented to person, place, and time. He appears well-developed.  HENT:  Mouth/Throat: Oropharynx is clear and moist.  Eyes: Conjunctivae are normal. Pupils are equal, round, and reactive to light.  Neck: Normal range of motion. No JVD present. No thyromegaly present.  Cardiovascular: Normal rate, regular rhythm, normal heart sounds and intact distal pulses.  Exam reveals no gallop and no friction rub.   No murmur heard. Pulmonary/Chest: Effort normal and breath sounds normal. No respiratory distress. He has no  wheezes. He has no rales. He exhibits no tenderness.  Abdominal: Soft. Bowel sounds are normal. He exhibits no distension and no mass. There is no tenderness. There is no rebound and no guarding.  Musculoskeletal: He exhibits no edema and no tenderness.       ROM is decreased  Lymphadenopathy:    He has no cervical adenopathy.  Neurological: He is alert and oriented to person, place, and time. He has normal reflexes. No cranial nerve deficit. He exhibits normal muscle tone. Coordination normal.  Skin: Skin is warm and dry. No rash noted.       Nose and L cheek growth  Psychiatric: He has a normal mood and affect. His behavior is normal. Judgment and thought content normal.    . Lab Results  Component Value Date   WBC 7.1 09/16/2011   HGB 11.4* 12/11/2011   HCT 36.7* 09/16/2011   PLT 129* 09/16/2011   GLUCOSE 124* 09/16/2011   CHOL 117 06/16/2011   TRIG 75.0 06/16/2011   HDL 42.00 06/16/2011   LDLCALC 60 06/16/2011   ALT 11 09/16/2011   AST 16 09/16/2011   NA 136 09/16/2011   K 3.7 09/16/2011   CL 100 09/16/2011   CREATININE 1.54* 09/16/2011   BUN 27* 09/16/2011   CO2 25 09/16/2011   TSH 1.80 06/16/2011   PSA 13.73* 06/16/2011         Assessment & Plan:

## 2012-01-05 NOTE — Assessment & Plan Note (Signed)
Monitoring BMET 

## 2012-01-08 ENCOUNTER — Encounter (HOSPITAL_COMMUNITY)
Admission: RE | Admit: 2012-01-08 | Discharge: 2012-01-08 | Disposition: A | Discharge status: Home / Self Care | Payer: Medicare Other | Source: Ambulatory Visit | Attending: Nephrology | Admitting: Nephrology

## 2012-01-08 DIAGNOSIS — N183 Chronic kidney disease, stage 3 unspecified: Secondary | ICD-10-CM | POA: Insufficient documentation

## 2012-01-08 DIAGNOSIS — D638 Anemia in other chronic diseases classified elsewhere: Secondary | ICD-10-CM | POA: Insufficient documentation

## 2012-01-08 LAB — POCT HEMOGLOBIN-HEMACUE: Hemoglobin: 11.1 g/dL — ABNORMAL LOW (ref 13.0–17.0)

## 2012-01-08 MED ORDER — EPOETIN ALFA 20000 UNIT/ML IJ SOLN: 20000.0000 [IU] | INTRAMUSCULAR | Status: DC

## 2012-01-08 MED ORDER — EPOETIN ALFA 20000 UNIT/ML IJ SOLN
INTRAMUSCULAR | Status: AC
  Administered 2012-01-08: 20000 [IU] via SUBCUTANEOUS
  Filled 2012-01-08: qty 1

## 2012-02-05 ENCOUNTER — Encounter (HOSPITAL_COMMUNITY)
Admission: RE | Admit: 2012-02-05 | Discharge: 2012-02-05 | Disposition: A | Discharge status: Home / Self Care | Payer: Medicare Other | Source: Ambulatory Visit | Attending: Nephrology | Admitting: Nephrology

## 2012-02-05 DIAGNOSIS — N183 Chronic kidney disease, stage 3 unspecified: Secondary | ICD-10-CM | POA: Insufficient documentation

## 2012-02-05 DIAGNOSIS — D638 Anemia in other chronic diseases classified elsewhere: Secondary | ICD-10-CM | POA: Insufficient documentation

## 2012-02-05 MED ORDER — EPOETIN ALFA 20000 UNIT/ML IJ SOLN
20000.0000 [IU] | INTRAMUSCULAR | Status: DC
  Administered 2012-02-05: 20000 [IU] via SUBCUTANEOUS

## 2012-02-05 MED ORDER — EPOETIN ALFA 20000 UNIT/ML IJ SOLN
INTRAMUSCULAR | Status: AC
  Administered 2012-02-05: 20000 [IU] via SUBCUTANEOUS
  Filled 2012-02-05: qty 1

## 2012-02-15 ENCOUNTER — Ambulatory Visit: Payer: Medicare Other | Admitting: Cardiovascular Disease

## 2012-02-22 ENCOUNTER — Encounter (INDEPENDENT_AMBULATORY_CARE_PROVIDER_SITE_OTHER): Payer: Medicare Other

## 2012-02-22 DIAGNOSIS — I714 Abdominal aortic aneurysm, without rupture: Secondary | ICD-10-CM

## 2012-02-22 DIAGNOSIS — I2510 Atherosclerotic heart disease of native coronary artery without angina pectoris: Secondary | ICD-10-CM

## 2012-02-23 ENCOUNTER — Encounter: Payer: Self-pay | Admitting: Cardiovascular Disease

## 2012-02-23 ENCOUNTER — Ambulatory Visit (INDEPENDENT_AMBULATORY_CARE_PROVIDER_SITE_OTHER): Payer: Medicare Other | Admitting: Cardiovascular Disease

## 2012-02-23 VITALS — BP 132/65 | HR 63 | Wt 155.0 lb

## 2012-02-23 DIAGNOSIS — I10 Essential (primary) hypertension: Secondary | ICD-10-CM

## 2012-02-23 DIAGNOSIS — I25810 Atherosclerosis of coronary artery bypass graft(s) without angina pectoris: Secondary | ICD-10-CM

## 2012-02-23 DIAGNOSIS — I2510 Atherosclerotic heart disease of native coronary artery without angina pectoris: Secondary | ICD-10-CM

## 2012-02-23 DIAGNOSIS — I714 Abdominal aortic aneurysm, without rupture: Secondary | ICD-10-CM

## 2012-02-23 NOTE — Assessment & Plan Note (Signed)
Remote CABG and history of PCI. No anginal symptoms at present. Continue current medical program.

## 2012-02-23 NOTE — Patient Instructions (Addendum)
Your physician wants you to follow-up in: 6 months with Dr. Excell Seltzer. You will receive a reminder letter in the mail two months in advance. If you don't receive a letter, please call our office to schedule the follow-up appointment.  Your physician has requested that you have an abdominal aorta duplex in 6 months just prior to your appointment with Dr. Excell Seltzer.. During this test, an ultrasound is used to evaluate the aorta. Allow 30 minutes for this exam. Do not eat after midnight the day before and avoid carbonated beverages

## 2012-02-23 NOTE — Assessment & Plan Note (Signed)
Blood pressure is well controlled 

## 2012-02-23 NOTE — Progress Notes (Signed)
   HPI:  78 year old gentleman presenting for followup evaluation. The patient has coronary artery disease with remote CABG and abdominal aortic aneurysm. He just underwent a followup abdominal aortic duplex scan which showed stability of his infrarenal AAA at less than 5.5 cm in maximum diameter. Considering his advanced age we have been treating him conservatively with medical therapy.  He's doing well today. When I saw him last, he complained of progressive gait instability. He feels like this is leveled off and is not having as much difficulty. He's had no falls. He denies chest pain or pressure, dyspnea, orthopnea, or PND. He's noted a right inguinal hernia and has mild discomfort when he sits forward. He's had this for a long time. There's been a progression of his symptoms. He has not had abdominal or back pain. He has no other complaints today.  Outpatient Encounter Prescriptions as of 02/23/2012  Medication Sig Dispense Refill  . allopurinol (ZYLOPRIM) 100 MG tablet Take 200 mg by mouth daily.      Marland Kitchen aspirin 325 MG tablet Take 1/2 tablet      . bimatoprost (LUMIGAN) 0.03 % ophthalmic drops Place 1 drop into both eyes at bedtime.        Marland Kitchen ezetimibe-simvastatin (VYTORIN) 10-40 MG per tablet Take 1 tablet by mouth at bedtime.        . fish oil-omega-3 fatty acids 1000 MG capsule Take 1 g by mouth 2 (two) times daily.      . furosemide (LASIX) 80 MG tablet Take 40 mg by mouth 2 (two) times daily.       . nitroGLYCERIN (NITROSTAT) 0.4 MG SL tablet Place 0.4 mg under the tongue as needed. Chest pain      . ondansetron (ZOFRAN) 4 MG/2ML SOLN injection Inject 4 mg into the vein once.      . predniSONE (DELTASONE) 10 MG tablet Take 10 mg by mouth as needed. Take medication for gout flare ups      . telmisartan (MICARDIS) 80 MG tablet Take 80 mg by mouth as needed.       . terazosin (HYTRIN) 5 MG capsule TAKE 1 CAPSULE BY MOUTH ONCE A DAY  90 capsule  2  . timolol (TIMOPTIC) 0.5 % ophthalmic solution  Place 1 drop into both eyes 2 (two) times daily.         Allergies  Allergen Reactions  . Sulfa Antibiotics   . Sulfacetamide Sodium-Sulfur     Past Medical History  Diagnosis Date  . CAD (coronary artery disease)     S/P CABG and multiple PCI's  . Hyperlipidemia   . HTN (hypertension)   . AAA (abdominal aortic aneurysm)     5 cm on 2011 assessment  . Gout 2009  . Renal insufficiency   . Elevated PSA     Dr Earlene Plater    ROS: Negative except as per HPI  BP 132/65  Pulse 63  Wt 70.308 kg (155 lb)  PHYSICAL EXAM: Pt is alert and oriented, pleasant elderly male in NAD HEENT: normal Neck: JVP - normal, carotids 2+= without bruits Lungs: CTA bilaterally CV: RRR without murmur or gallop Abd: soft, NT, the aorta is easily palpable enlarged Ext: no C/C/E Skin: warm/dry no rash  EKG:  Normal sinus rhythm, right atrial enlargement, within normal limits.  ASSESSMENT AND PLAN:

## 2012-02-23 NOTE — Assessment & Plan Note (Signed)
No abdominal pain or symptoms attributable to this at the present time. His abdominal aortic duplex was reviewed and his aortic aneurysm size is unchanged at 5.2 cm. He should return in 6 months for a repeat duplex scan and I will see him back after his scan is done.

## 2012-03-01 ENCOUNTER — Other Ambulatory Visit (HOSPITAL_COMMUNITY): Payer: Self-pay | Admitting: *Deleted

## 2012-03-04 ENCOUNTER — Encounter (HOSPITAL_COMMUNITY)
Admission: RE | Admit: 2012-03-04 | Discharge: 2012-03-04 | Disposition: A | Discharge status: Home / Self Care | Payer: Medicare Other | Source: Ambulatory Visit | Attending: Nephrology | Admitting: Nephrology

## 2012-03-04 DIAGNOSIS — N183 Chronic kidney disease, stage 3 unspecified: Secondary | ICD-10-CM | POA: Insufficient documentation

## 2012-03-04 DIAGNOSIS — D638 Anemia in other chronic diseases classified elsewhere: Secondary | ICD-10-CM | POA: Insufficient documentation

## 2012-03-04 LAB — POCT HEMOGLOBIN-HEMACUE: Hemoglobin: 12 g/dL — ABNORMAL LOW (ref 13.0–17.0)

## 2012-03-04 MED ORDER — EPOETIN ALFA 20000 UNIT/ML IJ SOLN: 20000.0000 [IU] | INTRAMUSCULAR | Status: DC

## 2012-03-18 ENCOUNTER — Encounter (HOSPITAL_COMMUNITY)
Admission: RE | Admit: 2012-03-18 | Discharge: 2012-03-18 | Disposition: A | Discharge status: Home / Self Care | Payer: Medicare Other | Source: Ambulatory Visit | Attending: Nephrology | Admitting: Nephrology

## 2012-03-18 LAB — POCT HEMOGLOBIN-HEMACUE: Hemoglobin: 11.6 g/dL — ABNORMAL LOW (ref 13.0–17.0)

## 2012-03-18 MED ORDER — EPOETIN ALFA 20000 UNIT/ML IJ SOLN
20000.0000 [IU] | INTRAMUSCULAR | Status: DC
  Administered 2012-03-18: 20000 [IU] via SUBCUTANEOUS

## 2012-03-18 MED ORDER — EPOETIN ALFA 20000 UNIT/ML IJ SOLN
INTRAMUSCULAR | Status: AC
  Administered 2012-03-18: 20000 [IU] via SUBCUTANEOUS
  Filled 2012-03-18: qty 1

## 2012-04-05 ENCOUNTER — Encounter: Payer: Self-pay | Admitting: Internal Medicine

## 2012-04-05 ENCOUNTER — Ambulatory Visit (INDEPENDENT_AMBULATORY_CARE_PROVIDER_SITE_OTHER): Payer: Medicare Other | Admitting: Internal Medicine

## 2012-04-05 VITALS — BP 110/60 | HR 68 | Temp 98.1°F | Resp 16 | Wt 160.0 lb

## 2012-04-05 DIAGNOSIS — L259 Unspecified contact dermatitis, unspecified cause: Secondary | ICD-10-CM

## 2012-04-05 DIAGNOSIS — Z23 Encounter for immunization: Secondary | ICD-10-CM

## 2012-04-05 DIAGNOSIS — I10 Essential (primary) hypertension: Secondary | ICD-10-CM

## 2012-04-05 DIAGNOSIS — I2510 Atherosclerotic heart disease of native coronary artery without angina pectoris: Secondary | ICD-10-CM

## 2012-04-05 DIAGNOSIS — E785 Hyperlipidemia, unspecified: Secondary | ICD-10-CM

## 2012-04-05 DIAGNOSIS — L309 Dermatitis, unspecified: Secondary | ICD-10-CM

## 2012-04-05 MED ORDER — TRIAMCINOLONE ACETONIDE 0.5 % EX CREA: TOPICAL_CREAM | Freq: Two times a day (BID) | CUTANEOUS | Status: DC

## 2012-04-05 NOTE — Assessment & Plan Note (Signed)
Continue with current prescription therapy as reflected on the Med list.  

## 2012-04-05 NOTE — Assessment & Plan Note (Signed)
Triamcinolone cream Shower less

## 2012-04-05 NOTE — Progress Notes (Signed)
   Subjective:    Patient ID: Robert Ritter, male    DOB: May 23, 1920, 78 y.o.   MRN: [id number]  HPI  The patient presents for a follow-up of  chronic hypertension, chronic dyslipidemia, gout controlled with medicines. Doing well  C/o rash on chest and back x 2 mo - itching  BP Readings from Last 3 Encounters:  04/05/12 110/60  03/18/12 171/69  03/04/12 172/79   Wt Readings from Last 3 Encounters:  04/05/12 160 lb (72.576 kg)  02/23/12 155 lb (70.308 kg)  01/05/12 153 lb (69.4 kg)        Review of Systems  Constitutional: Negative for appetite change, fatigue and unexpected weight change.  HENT: Negative for nosebleeds, congestion, sore throat, sneezing, trouble swallowing and neck pain.   Eyes: Negative for itching and visual disturbance.  Respiratory: Negative for cough.   Cardiovascular: Negative for chest pain, palpitations and leg swelling.  Gastrointestinal: Negative for nausea, diarrhea, blood in stool and abdominal distention.  Genitourinary: Negative for frequency and hematuria.  Musculoskeletal: Positive for joint swelling, arthralgias and gait problem. Negative for back pain.       Stiff  Skin: Negative for rash.  Neurological: Negative for dizziness, tremors, speech difficulty and weakness.  Psychiatric/Behavioral: Negative for suicidal ideas, disturbed wake/sleep cycle, dysphoric mood and agitation. The patient is not nervous/anxious.        Objective:   Physical Exam  Constitutional: He is oriented to person, place, and time. He appears well-developed.  HENT:  Mouth/Throat: Oropharynx is clear and moist.  Eyes: Conjunctivae normal are normal. Pupils are equal, round, and reactive to light.  Neck: Normal range of motion. No JVD present. No thyromegaly present.  Cardiovascular: Normal rate, regular rhythm, normal heart sounds and intact distal pulses.  Exam reveals no gallop and no friction rub.   No murmur heard. Pulmonary/Chest: Effort normal and  breath sounds normal. No respiratory distress. He has no wheezes. He has no rales. He exhibits no tenderness.  Abdominal: Soft. Bowel sounds are normal. He exhibits no distension and no mass. There is no tenderness. There is no rebound and no guarding.  Musculoskeletal: He exhibits no edema and no tenderness.       ROM is decreased  Lymphadenopathy:    He has no cervical adenopathy.  Neurological: He is alert and oriented to person, place, and time. He has normal reflexes. No cranial nerve deficit. He exhibits normal muscle tone. Coordination normal.  Skin: Skin is warm and dry. No rash noted.       Nose and L cheek growth  Psychiatric: He has a normal mood and affect. His behavior is normal. Judgment and thought content normal.  patchy rash - dry skin on the trunk   . Lab Results  Component Value Date   WBC 7.1 09/16/2011   HGB 11.6* 03/18/2012   HCT 36.7* 09/16/2011   PLT 129* 09/16/2011   GLUCOSE 124* 09/16/2011   CHOL 117 06/16/2011   TRIG 75.0 06/16/2011   HDL 42.00 06/16/2011   LDLCALC 60 06/16/2011   ALT 11 09/16/2011   AST 16 09/16/2011   NA 136 09/16/2011   K 3.7 09/16/2011   CL 100 09/16/2011   CREATININE 1.54* 09/16/2011   BUN 27* 09/16/2011   CO2 25 09/16/2011   TSH 1.80 06/16/2011   PSA 13.73* 06/16/2011         Assessment & Plan:

## 2012-04-15 ENCOUNTER — Encounter (HOSPITAL_COMMUNITY)
Admission: RE | Admit: 2012-04-15 | Discharge: 2012-04-15 | Disposition: A | Discharge status: Home / Self Care | Payer: Medicare Other | Source: Ambulatory Visit | Attending: Nephrology | Admitting: Nephrology

## 2012-04-15 DIAGNOSIS — D638 Anemia in other chronic diseases classified elsewhere: Secondary | ICD-10-CM | POA: Insufficient documentation

## 2012-04-15 DIAGNOSIS — N183 Chronic kidney disease, stage 3 unspecified: Secondary | ICD-10-CM | POA: Insufficient documentation

## 2012-04-15 LAB — POCT HEMOGLOBIN-HEMACUE: Hemoglobin: 11.9 g/dL — ABNORMAL LOW (ref 13.0–17.0)

## 2012-04-15 MED ORDER — EPOETIN ALFA 20000 UNIT/ML IJ SOLN
INTRAMUSCULAR | Status: AC
  Administered 2012-04-15: 20000 [IU] via SUBCUTANEOUS
  Filled 2012-04-15: qty 1

## 2012-04-15 MED ORDER — EPOETIN ALFA 20000 UNIT/ML IJ SOLN: 20000.0000 [IU] | INTRAMUSCULAR | Status: DC

## 2012-05-10 ENCOUNTER — Other Ambulatory Visit (HOSPITAL_COMMUNITY): Payer: Self-pay | Admitting: *Deleted

## 2012-05-13 ENCOUNTER — Encounter (HOSPITAL_COMMUNITY)
Admission: RE | Admit: 2012-05-13 | Discharge: 2012-05-13 | Disposition: A | Discharge status: Home / Self Care | Payer: Medicare Other | Source: Ambulatory Visit | Attending: Nephrology | Admitting: Nephrology

## 2012-05-13 DIAGNOSIS — N183 Chronic kidney disease, stage 3 unspecified: Secondary | ICD-10-CM | POA: Insufficient documentation

## 2012-05-13 DIAGNOSIS — D638 Anemia in other chronic diseases classified elsewhere: Secondary | ICD-10-CM | POA: Insufficient documentation

## 2012-05-13 MED ORDER — EPOETIN ALFA 20000 UNIT/ML IJ SOLN: 20000.0000 [IU] | INTRAMUSCULAR | Status: DC

## 2012-05-27 ENCOUNTER — Encounter (HOSPITAL_COMMUNITY)
Admission: RE | Admit: 2012-05-27 | Discharge: 2012-05-27 | Disposition: A | Discharge status: Home / Self Care | Payer: Medicare Other | Source: Ambulatory Visit | Attending: Nephrology | Admitting: Nephrology

## 2012-05-27 DIAGNOSIS — D638 Anemia in other chronic diseases classified elsewhere: Secondary | ICD-10-CM | POA: Insufficient documentation

## 2012-05-27 DIAGNOSIS — N183 Chronic kidney disease, stage 3 unspecified: Secondary | ICD-10-CM | POA: Insufficient documentation

## 2012-05-27 LAB — IRON AND TIBC
Saturation Ratios: 28 % (ref 20–55)
UIBC: 179 ug/dL (ref 125–400)

## 2012-05-27 LAB — FERRITIN: Ferritin: 219 ng/mL (ref 22–322)

## 2012-05-27 MED ORDER — EPOETIN ALFA 20000 UNIT/ML IJ SOLN
20000.0000 [IU] | INTRAMUSCULAR | Status: DC
  Administered 2012-05-27: 20000 [IU] via SUBCUTANEOUS

## 2012-05-27 MED ORDER — EPOETIN ALFA 20000 UNIT/ML IJ SOLN
INTRAMUSCULAR | Status: AC
  Filled 2012-05-27: qty 1

## 2012-06-24 ENCOUNTER — Encounter (HOSPITAL_COMMUNITY)
Admission: RE | Admit: 2012-06-24 | Discharge: 2012-06-24 | Disposition: A | Discharge status: Home / Self Care | Payer: Medicare Other | Source: Ambulatory Visit | Attending: Nephrology | Admitting: Nephrology

## 2012-06-24 LAB — POCT HEMOGLOBIN-HEMACUE: Hemoglobin: 12.4 g/dL — ABNORMAL LOW (ref 13.0–17.0)

## 2012-06-24 MED ORDER — EPOETIN ALFA 20000 UNIT/ML IJ SOLN: 20000.0000 [IU] | INTRAMUSCULAR | Status: DC

## 2012-07-08 ENCOUNTER — Encounter (HOSPITAL_COMMUNITY)
Admission: RE | Admit: 2012-07-08 | Discharge: 2012-07-08 | Disposition: A | Discharge status: Home / Self Care | Payer: Medicare Other | Source: Ambulatory Visit | Attending: Nephrology | Admitting: Nephrology

## 2012-07-08 DIAGNOSIS — N183 Chronic kidney disease, stage 3 unspecified: Secondary | ICD-10-CM | POA: Insufficient documentation

## 2012-07-08 DIAGNOSIS — D638 Anemia in other chronic diseases classified elsewhere: Secondary | ICD-10-CM | POA: Insufficient documentation

## 2012-07-08 LAB — POCT HEMOGLOBIN-HEMACUE: Hemoglobin: 12 g/dL — ABNORMAL LOW (ref 13.0–17.0)

## 2012-07-08 MED ORDER — EPOETIN ALFA 20000 UNIT/ML IJ SOLN: 20000.0000 [IU] | INTRAMUSCULAR | Status: DC

## 2012-07-12 ENCOUNTER — Other Ambulatory Visit (INDEPENDENT_AMBULATORY_CARE_PROVIDER_SITE_OTHER): Payer: Medicare Other

## 2012-07-12 ENCOUNTER — Ambulatory Visit (INDEPENDENT_AMBULATORY_CARE_PROVIDER_SITE_OTHER)
Admission: RE | Admit: 2012-07-12 | Discharge: 2012-07-12 | Disposition: A | Discharge status: Home / Self Care | Payer: Medicare Other | Source: Ambulatory Visit | Attending: Internal Medicine | Admitting: Internal Medicine

## 2012-07-12 ENCOUNTER — Ambulatory Visit (INDEPENDENT_AMBULATORY_CARE_PROVIDER_SITE_OTHER): Payer: Medicare Other | Admitting: Internal Medicine

## 2012-07-12 ENCOUNTER — Encounter: Payer: Self-pay | Admitting: Internal Medicine

## 2012-07-12 ENCOUNTER — Telehealth: Payer: Self-pay | Admitting: Internal Medicine

## 2012-07-12 VITALS — BP 118/60 | HR 72 | Resp 16 | Wt 162.0 lb

## 2012-07-12 DIAGNOSIS — N259 Disorder resulting from impaired renal tubular function, unspecified: Secondary | ICD-10-CM

## 2012-07-12 DIAGNOSIS — R5383 Other fatigue: Secondary | ICD-10-CM | POA: Insufficient documentation

## 2012-07-12 DIAGNOSIS — R202 Paresthesia of skin: Secondary | ICD-10-CM

## 2012-07-12 DIAGNOSIS — E785 Hyperlipidemia, unspecified: Secondary | ICD-10-CM

## 2012-07-12 DIAGNOSIS — R209 Unspecified disturbances of skin sensation: Secondary | ICD-10-CM

## 2012-07-12 DIAGNOSIS — R29898 Other symptoms and signs involving the musculoskeletal system: Secondary | ICD-10-CM

## 2012-07-12 DIAGNOSIS — R269 Unspecified abnormalities of gait and mobility: Secondary | ICD-10-CM

## 2012-07-12 DIAGNOSIS — I10 Essential (primary) hypertension: Secondary | ICD-10-CM

## 2012-07-12 DIAGNOSIS — R5381 Other malaise: Secondary | ICD-10-CM

## 2012-07-12 DIAGNOSIS — I2510 Atherosclerotic heart disease of native coronary artery without angina pectoris: Secondary | ICD-10-CM

## 2012-07-12 LAB — BASIC METABOLIC PANEL
BUN: 32 mg/dL — ABNORMAL HIGH (ref 6–23)
CO2: 30 mEq/L (ref 19–32)
Calcium: 8.9 mg/dL (ref 8.4–10.5)
Chloride: 104 mEq/L (ref 96–112)
Creatinine, Ser: 1.5 mg/dL (ref 0.4–1.5)

## 2012-07-12 LAB — CBC WITH DIFFERENTIAL/PLATELET
Basophils Absolute: 0 10*3/uL (ref 0.0–0.1)
Eosinophils Absolute: 0.7 10*3/uL (ref 0.0–0.7)
Eosinophils Relative: 9.8 % — ABNORMAL HIGH (ref 0.0–5.0)
HCT: 36.4 % — ABNORMAL LOW (ref 39.0–52.0)
Lymphs Abs: 1.1 10*3/uL (ref 0.7–4.0)
MCV: 88.8 fl (ref 78.0–100.0)
Monocytes Absolute: 0.5 10*3/uL (ref 0.1–1.0)
Neutrophils Relative %: 65.8 % (ref 43.0–77.0)
Platelets: 159 10*3/uL (ref 150.0–400.0)
RDW: 14.2 % (ref 11.5–14.6)
WBC: 6.7 10*3/uL (ref 4.5–10.5)

## 2012-07-12 LAB — CK: Total CK: 91 U/L (ref 7–232)

## 2012-07-12 LAB — VITAMIN B12: Vitamin B-12: 639 pg/mL (ref 211–911)

## 2012-07-12 NOTE — Assessment & Plan Note (Signed)
Continue with current prescription therapy as reflected on the Med list.  

## 2012-07-12 NOTE — Progress Notes (Signed)
   Subjective:    HPI  The patient presents for a follow-up of  chronic hypertension, chronic dyslipidemia, gout controlled with medicines - he had one episode in L wrist. Doing well  C/o rash on chest and back x 2 mo - itching  BP Readings from Last 3 Encounters:  07/12/12 118/60  07/08/12 136/61  06/24/12 136/64   Wt Readings from Last 3 Encounters:  07/12/12 162 lb (73.483 kg)  04/05/12 160 lb (72.576 kg)  02/23/12 155 lb (70.308 kg)   Review of Systems  Constitutional: Negative for appetite change, fatigue and unexpected weight change.  HENT: Negative for nosebleeds, congestion, sore throat, sneezing, trouble swallowing and neck pain.   Eyes: Negative for itching and visual disturbance.  Respiratory: Negative for cough.   Cardiovascular: Negative for chest pain, palpitations and leg swelling.  Gastrointestinal: Negative for nausea, diarrhea, blood in stool and abdominal distention.  Genitourinary: Negative for frequency and hematuria.  Musculoskeletal: Positive for joint swelling, arthralgias and gait problem. Negative for back pain.       Stiff  Skin: Negative for rash.  Neurological: Negative for dizziness, tremors, speech difficulty and weakness.  Psychiatric/Behavioral: Negative for suicidal ideas, sleep disturbance, dysphoric mood and agitation. The patient is not nervous/anxious.        Objective:   Physical Exam  Constitutional: He is oriented to person, place, and time. He appears well-developed.  HENT:  Mouth/Throat: Oropharynx is clear and moist.  Eyes: Conjunctivae normal are normal. Pupils are equal, round, and reactive to light.  Neck: Normal range of motion. No JVD present. No thyromegaly present.  Cardiovascular: Normal rate, regular rhythm, normal heart sounds and intact distal pulses.  Exam reveals no gallop and no friction rub.   No murmur heard. Pulmonary/Chest: Effort normal and breath sounds normal. No respiratory distress. He has no wheezes. He  has no rales. He exhibits no tenderness.  Abdominal: Soft. Bowel sounds are normal. He exhibits no distension and no mass. There is no tenderness. There is no rebound and no guarding.  Musculoskeletal: He exhibits no edema and no tenderness.       ROM is decreased  Lymphadenopathy:    He has no cervical adenopathy.  Neurological: He is alert and oriented to person, place, and time. He has normal reflexes. No cranial nerve deficit. He exhibits normal muscle tone. Coordination normal.  Skin: Skin is warm and dry. No rash noted.       Nose and L cheek growth  Psychiatric: He has a normal mood and affect. His behavior is normal. Judgment and thought content normal.  patchy rash - dry skin on the trunk  Str leg elev is neg B B hips ok  . Lab Results  Component Value Date   WBC 7.1 09/16/2011   HGB 12.0* 07/08/2012   HCT 36.7* 09/16/2011   PLT 129* 09/16/2011   GLUCOSE 124* 09/16/2011   CHOL 117 06/16/2011   TRIG 75.0 06/16/2011   HDL 42.00 06/16/2011   LDLCALC 60 06/16/2011   ALT 11 09/16/2011   AST 16 09/16/2011   NA 136 09/16/2011   K 3.7 09/16/2011   CL 100 09/16/2011   CREATININE 1.54* 09/16/2011   BUN 27* 09/16/2011   CO2 25 09/16/2011   TSH 1.80 06/16/2011   PSA 13.73* 06/16/2011         Assessment & Plan:

## 2012-07-12 NOTE — Telephone Encounter (Signed)
Misty Stanley, please, inform patient that he has mild OA in the back - not bad at all Thx

## 2012-07-12 NOTE — Assessment & Plan Note (Signed)
Labs

## 2012-07-12 NOTE — Assessment & Plan Note (Addendum)
Chronic - poss sciatica  LS xray Declined PT

## 2012-07-15 NOTE — Telephone Encounter (Signed)
Pt informed

## 2012-07-16 ENCOUNTER — Other Ambulatory Visit: Payer: Self-pay | Admitting: Internal Medicine

## 2012-07-22 ENCOUNTER — Encounter (HOSPITAL_COMMUNITY)
Admission: RE | Admit: 2012-07-22 | Discharge: 2012-07-22 | Disposition: A | Discharge status: Home / Self Care | Payer: Medicare Other | Source: Ambulatory Visit | Attending: Nephrology | Admitting: Nephrology

## 2012-07-22 MED ORDER — EPOETIN ALFA 20000 UNIT/ML IJ SOLN
20000.0000 [IU] | INTRAMUSCULAR | Status: DC
  Administered 2012-07-22: 20000 [IU] via SUBCUTANEOUS

## 2012-07-22 MED ORDER — EPOETIN ALFA 20000 UNIT/ML IJ SOLN
INTRAMUSCULAR | Status: AC
  Filled 2012-07-22: qty 1

## 2012-07-23 DIAGNOSIS — IMO0002 Reserved for concepts with insufficient information to code with codable children: Secondary | ICD-10-CM

## 2012-07-23 HISTORY — DX: Reserved for concepts with insufficient information to code with codable children: IMO0002

## 2012-08-14 ENCOUNTER — Encounter (INDEPENDENT_AMBULATORY_CARE_PROVIDER_SITE_OTHER): Payer: Medicare Other

## 2012-08-14 ENCOUNTER — Ambulatory Visit (INDEPENDENT_AMBULATORY_CARE_PROVIDER_SITE_OTHER): Payer: Medicare Other | Admitting: Cardiovascular Disease

## 2012-08-14 VITALS — BP 140/64 | HR 56 | Ht 70.0 in | Wt 165.0 lb

## 2012-08-14 DIAGNOSIS — I2510 Atherosclerotic heart disease of native coronary artery without angina pectoris: Secondary | ICD-10-CM

## 2012-08-14 NOTE — Progress Notes (Signed)
HPI:  79 year old gentleman presenting for followup evaluation. The patient has coronary artery disease with remote CABG. He is also followed for an abdominal aortic aneurysm. He undergoes ultrasound imaging of his aneurysm every 6 months. His study today demonstrates stability with maximum dimensions 5.1 x 5.2 cm in diameter. He's had no abdominal or chest pain. He's had some difficulty with blood pressure lability. At times he feels weak and associates this with hypotension. He eats some salt and rests when this occurs. At other times he has systolic pressures in the 170s. He follows his blood pressure closely at home. He denies edema, syncope, palpitations, or chest discomfort.  Outpatient Encounter Prescriptions as of 08/14/2012  Medication Sig Dispense Refill  . aspirin 325 MG tablet Take 1/2 tablet      . bimatoprost (LUMIGAN) 0.03 % ophthalmic drops Place 1 drop into both eyes at bedtime.        Marland Kitchen ezetimibe-simvastatin (VYTORIN) 10-40 MG per tablet Take 1 tablet by mouth at bedtime.        . fish oil-omega-3 fatty acids 1000 MG capsule Take 1 g by mouth 2 (two) times daily.      . furosemide (LASIX) 80 MG tablet Take 40 mg by mouth 2 (two) times daily.       . nitroGLYCERIN (NITROSTAT) 0.4 MG SL tablet Place 0.4 mg under the tongue as needed. Chest pain      . ondansetron (ZOFRAN) 4 MG/2ML SOLN injection Inject 4 mg into the vein once.      . predniSONE (DELTASONE) 10 MG tablet Take 10 mg by mouth as needed. Take medication for gout flare ups      . telmisartan (MICARDIS) 80 MG tablet Take 80 mg by mouth as needed.       . terazosin (HYTRIN) 5 MG capsule TAKE 1 CAPSULE BY MOUTH ONCE A DAY  90 capsule  2  . triamcinolone cream (KENALOG) 0.5 % Apply topically 2 (two) times daily.  120 g  3  . [DISCONTINUED] allopurinol (ZYLOPRIM) 100 MG tablet Take 200 mg by mouth daily.      . [DISCONTINUED] timolol (TIMOPTIC) 0.5 % ophthalmic solution Place 1 drop into both eyes 2 (two) times daily.         No facility-administered encounter medications on file as of 08/14/2012.    Allergies  Allergen Reactions  . Sulfa Antibiotics   . Sulfacetamide Sodium-Sulfur     Past Medical History  Diagnosis Date  . CAD (coronary artery disease)     S/P CABG and multiple PCI's  . Hyperlipidemia   . HTN (hypertension)   . AAA (abdominal aortic aneurysm)     5 cm on 2011 assessment  . Gout 2009  . Renal insufficiency   . Elevated PSA     Dr Earlene Plater    ROS: Negative except as per HPI  BP 140/64  Pulse 56  Ht 5\' 10"  (1.778 m)  Wt 74.844 kg (165 lb)  BMI 23.68 kg/m2  PHYSICAL EXAM: Pt is alert and oriented, pleasant elderly male in NAD HEENT: normal Neck: JVP - normal, carotids 2+= without bruits Lungs: CTA bilaterally CV: RRR with grade 2/6 early peaking harsh systolic murmur) upper sternal border Abd: soft, NT, Positive BS, enlarged aorta is palpable Ext: no C/C/E, distal pulses intact and equal Skin: warm/dry no rash  ASSESSMENT AND PLAN: 1. Abdominal aortic aneurysm. The patient remained stable. He has been bradycardic so a beta blocker has not been utilized as part of  his medical regimen. We will followup with a repeat duplex scan in 6 months. He also has bilateral common iliac aneurysms that are 2 cm or less in maximal dimension. He understands the symptoms of aneurysm rupture and will call 911 if he has any abdominal or pelvic pain.  2. Coronary artery disease. Stable on current medical program. No anginal symptoms.  3. Hypertension. Blood pressure is somewhat labile but I think he is appropriately managing this. When his systolic readings are greater than 170 he takes Micardis.  4. Hyperlipidemia. He remains on Vytorin 10/40 mg daily.  Tonny Bollman 08/15/2012 5:33 AM

## 2012-08-14 NOTE — Patient Instructions (Addendum)
Your physician wants you to follow-up in: 6 MONTHS with Dr Excell Seltzer.  You will receive a reminder letter in the mail two months in advance. If you don't receive a letter, please call our office to schedule the follow-up appointment.  Your physician has requested that you have an abdominal aorta duplex in 6 MONTHS. During this test, an ultrasound is used to evaluate the aorta. Allow 30 minutes for this exam. Do not eat after midnight the day before and avoid carbonated beverages  Your physician recommends that you continue on your current medications as directed. Please refer to the Current Medication list given to you today.

## 2012-08-15 ENCOUNTER — Encounter: Payer: Self-pay | Admitting: Cardiovascular Disease

## 2012-08-16 ENCOUNTER — Other Ambulatory Visit (HOSPITAL_COMMUNITY): Payer: Self-pay | Admitting: *Deleted

## 2012-08-19 ENCOUNTER — Encounter (HOSPITAL_COMMUNITY)
Admission: RE | Admit: 2012-08-19 | Discharge: 2012-08-19 | Disposition: A | Discharge status: Home / Self Care | Payer: Medicare Other | Source: Ambulatory Visit | Attending: Nephrology | Admitting: Nephrology

## 2012-08-19 DIAGNOSIS — D638 Anemia in other chronic diseases classified elsewhere: Secondary | ICD-10-CM | POA: Insufficient documentation

## 2012-08-19 DIAGNOSIS — N183 Chronic kidney disease, stage 3 unspecified: Secondary | ICD-10-CM | POA: Insufficient documentation

## 2012-08-19 LAB — POCT HEMOGLOBIN-HEMACUE: Hemoglobin: 12.4 g/dL — ABNORMAL LOW (ref 13.0–17.0)

## 2012-08-19 MED ORDER — EPOETIN ALFA 20000 UNIT/ML IJ SOLN: 20000.0000 [IU] | INTRAMUSCULAR | Status: DC

## 2012-09-02 ENCOUNTER — Encounter (HOSPITAL_COMMUNITY)
Admission: RE | Admit: 2012-09-02 | Discharge: 2012-09-02 | Disposition: A | Discharge status: Home / Self Care | Payer: Medicare Other | Source: Ambulatory Visit | Attending: Nephrology | Admitting: Nephrology

## 2012-09-02 DIAGNOSIS — D638 Anemia in other chronic diseases classified elsewhere: Secondary | ICD-10-CM | POA: Insufficient documentation

## 2012-09-02 DIAGNOSIS — N183 Chronic kidney disease, stage 3 unspecified: Secondary | ICD-10-CM | POA: Insufficient documentation

## 2012-09-02 LAB — IRON AND TIBC
Saturation Ratios: 26 % (ref 20–55)
UIBC: 199 ug/dL (ref 125–400)

## 2012-09-02 MED ORDER — EPOETIN ALFA 20000 UNIT/ML IJ SOLN: 20000.0000 [IU] | INTRAMUSCULAR | Status: DC

## 2012-09-16 ENCOUNTER — Encounter (HOSPITAL_COMMUNITY)
Admission: RE | Admit: 2012-09-16 | Discharge: 2012-09-16 | Disposition: A | Discharge status: Home / Self Care | Payer: Medicare Other | Source: Ambulatory Visit | Attending: Nephrology | Admitting: Nephrology

## 2012-09-16 MED ORDER — EPOETIN ALFA 20000 UNIT/ML IJ SOLN
INTRAMUSCULAR | Status: AC
  Filled 2012-09-16: qty 1

## 2012-09-16 MED ORDER — EPOETIN ALFA 20000 UNIT/ML IJ SOLN
20000.0000 [IU] | INTRAMUSCULAR | Status: DC
  Administered 2012-09-16: 20000 [IU] via SUBCUTANEOUS

## 2012-10-09 ENCOUNTER — Ambulatory Visit: Payer: Medicare Other | Admitting: Internal Medicine

## 2012-10-09 ENCOUNTER — Encounter: Payer: Self-pay | Admitting: Internal Medicine

## 2012-10-09 ENCOUNTER — Ambulatory Visit (INDEPENDENT_AMBULATORY_CARE_PROVIDER_SITE_OTHER): Payer: Medicare Other | Admitting: Internal Medicine

## 2012-10-09 VITALS — BP 132/72 | HR 68 | Temp 97.5°F | Resp 16 | Wt 164.0 lb

## 2012-10-09 DIAGNOSIS — I10 Essential (primary) hypertension: Secondary | ICD-10-CM

## 2012-10-09 DIAGNOSIS — E785 Hyperlipidemia, unspecified: Secondary | ICD-10-CM

## 2012-10-09 DIAGNOSIS — M109 Gout, unspecified: Secondary | ICD-10-CM

## 2012-10-09 DIAGNOSIS — I714 Abdominal aortic aneurysm, without rupture: Secondary | ICD-10-CM

## 2012-10-09 DIAGNOSIS — N259 Disorder resulting from impaired renal tubular function, unspecified: Secondary | ICD-10-CM

## 2012-10-09 MED ORDER — TERAZOSIN HCL 5 MG PO CAPS: 5.0000 mg | ORAL_CAPSULE | Freq: Every day | ORAL | Status: DC

## 2012-10-09 MED ORDER — FUROSEMIDE 80 MG PO TABS: 40.0000 mg | ORAL_TABLET | Freq: Two times a day (BID) | ORAL | Status: DC

## 2012-10-09 NOTE — Assessment & Plan Note (Signed)
Continue with current prescription therapy as reflected on the Med list.  

## 2012-10-09 NOTE — Assessment & Plan Note (Signed)
Monitored q 6 mo w/US

## 2012-10-09 NOTE — Assessment & Plan Note (Addendum)
Continue with current prescription therapy as reflected on the Med list.  

## 2012-10-09 NOTE — Assessment & Plan Note (Signed)
No relapse 

## 2012-10-09 NOTE — Assessment & Plan Note (Signed)
Watching labs 

## 2012-10-09 NOTE — Progress Notes (Signed)
   Subjective:    HPI  The patient presents for a follow-up of  chronic hypertension, chronic dyslipidemia, gout controlled with medicines. Doing well. F/u AAA, BPH    BP Readings from Last 3 Encounters:  10/09/12 132/72  09/16/12 155/76  09/02/12 112/56   Wt Readings from Last 3 Encounters:  10/09/12 164 lb (74.39 kg)  08/14/12 165 lb (74.844 kg)  07/12/12 162 lb (73.483 kg)   Review of Systems  Constitutional: Negative for appetite change, fatigue and unexpected weight change.  HENT: Negative for nosebleeds, congestion, sore throat, sneezing, trouble swallowing and neck pain.   Eyes: Negative for itching and visual disturbance.  Respiratory: Negative for cough.   Cardiovascular: Negative for chest pain, palpitations and leg swelling.  Gastrointestinal: Negative for nausea, diarrhea, blood in stool and abdominal distention.  Genitourinary: Negative for frequency and hematuria.  Musculoskeletal: Positive for joint swelling, arthralgias and gait problem. Negative for back pain.       Stiff  Skin: Negative for rash.  Neurological: Negative for dizziness, tremors, speech difficulty and weakness.  Psychiatric/Behavioral: Negative for suicidal ideas, sleep disturbance, dysphoric mood and agitation. The patient is not nervous/anxious.        Objective:   Physical Exam  Constitutional: He is oriented to person, place, and time. He appears well-developed.  HENT:  Mouth/Throat: Oropharynx is clear and moist. No oropharyngeal exudate.  Eyes: Conjunctivae are normal. Pupils are equal, round, and reactive to light.  Neck: Normal range of motion. No JVD present. No thyromegaly present.  Cardiovascular: Normal rate, regular rhythm, normal heart sounds and intact distal pulses.  Exam reveals no gallop and no friction rub.   No murmur heard. Pulmonary/Chest: Effort normal and breath sounds normal. No respiratory distress. He has no wheezes. He has no rales. He exhibits no tenderness.   Abdominal: Soft. Bowel sounds are normal. He exhibits no distension and no mass. There is no tenderness. There is no rebound and no guarding.  Musculoskeletal: He exhibits no edema and no tenderness.  ROM is decreased  Lymphadenopathy:    He has cervical adenopathy.  Neurological: He is alert and oriented to person, place, and time. He has normal reflexes. No cranial nerve deficit. He exhibits normal muscle tone. Coordination normal.  Skin: Skin is warm and dry. No rash noted.  Nose and L cheek growth  Psychiatric: He has a normal mood and affect. His behavior is normal. Judgment and thought content normal.  patchy rash - dry skin on the trunk  Str leg elev is neg B   . Lab Results  Component Value Date   WBC 6.7 07/12/2012   HGB 11.6* 09/16/2012   HCT 36.4* 07/12/2012   PLT 159.0 07/12/2012   GLUCOSE 89 07/12/2012   CHOL 117 06/16/2011   TRIG 75.0 06/16/2011   HDL 42.00 06/16/2011   LDLCALC 60 06/16/2011   ALT 11 09/16/2011   AST 16 09/16/2011   NA 141 07/12/2012   K 4.2 07/12/2012   CL 104 07/12/2012   CREATININE 1.5 07/12/2012   BUN 32* 07/12/2012   CO2 30 07/12/2012   TSH 1.80 06/16/2011   PSA 13.73* 06/16/2011         Assessment & Plan:

## 2012-10-14 ENCOUNTER — Encounter (HOSPITAL_COMMUNITY)
Admission: RE | Admit: 2012-10-14 | Discharge: 2012-10-14 | Disposition: A | Discharge status: Home / Self Care | Payer: Medicare Other | Source: Ambulatory Visit | Attending: Nephrology | Admitting: Nephrology

## 2012-10-14 DIAGNOSIS — D638 Anemia in other chronic diseases classified elsewhere: Secondary | ICD-10-CM | POA: Insufficient documentation

## 2012-10-14 DIAGNOSIS — N183 Chronic kidney disease, stage 3 unspecified: Secondary | ICD-10-CM | POA: Insufficient documentation

## 2012-10-14 MED ORDER — EPOETIN ALFA 20000 UNIT/ML IJ SOLN
INTRAMUSCULAR | Status: AC
  Administered 2012-10-14: 20000 [IU] via SUBCUTANEOUS
  Filled 2012-10-14: qty 1

## 2012-10-14 MED ORDER — EPOETIN ALFA 20000 UNIT/ML IJ SOLN: 20000.0000 [IU] | INTRAMUSCULAR | Status: DC

## 2012-11-08 ENCOUNTER — Other Ambulatory Visit (HOSPITAL_COMMUNITY): Payer: Self-pay

## 2012-11-11 ENCOUNTER — Encounter (HOSPITAL_COMMUNITY)
Admission: RE | Admit: 2012-11-11 | Discharge: 2012-11-11 | Disposition: A | Discharge status: Home / Self Care | Payer: Medicare Other | Source: Ambulatory Visit | Attending: Nephrology | Admitting: Nephrology

## 2012-11-11 DIAGNOSIS — N183 Chronic kidney disease, stage 3 unspecified: Secondary | ICD-10-CM | POA: Insufficient documentation

## 2012-11-11 DIAGNOSIS — D638 Anemia in other chronic diseases classified elsewhere: Secondary | ICD-10-CM | POA: Insufficient documentation

## 2012-11-11 LAB — POCT HEMOGLOBIN-HEMACUE: Hemoglobin: 11.2 g/dL — ABNORMAL LOW (ref 13.0–17.0)

## 2012-11-11 LAB — FERRITIN: Ferritin: 156 ng/mL (ref 22–322)

## 2012-11-11 LAB — IRON AND TIBC
Iron: 55 ug/dL (ref 42–135)
UIBC: 181 ug/dL (ref 125–400)

## 2012-11-11 MED ORDER — EPOETIN ALFA 20000 UNIT/ML IJ SOLN
20000.0000 [IU] | INTRAMUSCULAR | Status: DC
Start: 2012-11-11 — End: 2012-11-12
  Administered 2012-11-11: 20000 [IU] via SUBCUTANEOUS

## 2012-11-11 MED ORDER — EPOETIN ALFA 20000 UNIT/ML IJ SOLN
INTRAMUSCULAR | Status: AC
  Filled 2012-11-11: qty 1

## 2012-11-12 ENCOUNTER — Telehealth: Payer: Self-pay | Admitting: Cardiovascular Disease

## 2012-11-12 DIAGNOSIS — I714 Abdominal aortic aneurysm, without rupture: Secondary | ICD-10-CM

## 2012-11-12 NOTE — Telephone Encounter (Signed)
Called patient to inform him that his ultrasound is due in August.  Sending message to [facility] for someone to call patient and schedule o/v and AAA duplex same day. Future order entered for AAA duplex.

## 2012-11-12 NOTE — Telephone Encounter (Signed)
New problem   Pt was calling to sched an [place] but didn't see an order in the system. Advise pt to call back in a couple days to give nurse time to put order in.

## 2012-11-19 NOTE — Telephone Encounter (Signed)
Aorta Duplex 02/18/13 @ 9am

## 2012-12-09 ENCOUNTER — Encounter (HOSPITAL_COMMUNITY)
Admission: RE | Admit: 2012-12-09 | Discharge: 2012-12-09 | Disposition: A | Discharge status: Home / Self Care | Payer: Medicare Other | Source: Ambulatory Visit | Attending: Nephrology | Admitting: Nephrology

## 2012-12-09 DIAGNOSIS — D638 Anemia in other chronic diseases classified elsewhere: Secondary | ICD-10-CM | POA: Insufficient documentation

## 2012-12-09 DIAGNOSIS — N183 Chronic kidney disease, stage 3 unspecified: Secondary | ICD-10-CM | POA: Insufficient documentation

## 2012-12-09 MED ORDER — EPOETIN ALFA 20000 UNIT/ML IJ SOLN: 20000.0000 [IU] | INTRAMUSCULAR | Status: DC

## 2012-12-09 MED ORDER — EPOETIN ALFA 20000 UNIT/ML IJ SOLN
INTRAMUSCULAR | Status: AC
  Administered 2012-12-09: 20000 [IU] via SUBCUTANEOUS
  Filled 2012-12-09: qty 1

## 2013-01-06 ENCOUNTER — Encounter (HOSPITAL_COMMUNITY)
Admission: RE | Admit: 2013-01-06 | Discharge: 2013-01-06 | Disposition: A | Discharge status: Home / Self Care | Payer: Medicare Other | Source: Ambulatory Visit | Attending: Nephrology | Admitting: Nephrology

## 2013-01-06 DIAGNOSIS — D638 Anemia in other chronic diseases classified elsewhere: Secondary | ICD-10-CM | POA: Insufficient documentation

## 2013-01-06 DIAGNOSIS — N183 Chronic kidney disease, stage 3 unspecified: Secondary | ICD-10-CM | POA: Insufficient documentation

## 2013-01-06 LAB — POCT HEMOGLOBIN-HEMACUE: Hemoglobin: 11.7 g/dL — ABNORMAL LOW (ref 13.0–17.0)

## 2013-01-06 LAB — IRON AND TIBC: TIBC: 237 ug/dL (ref 215–435)

## 2013-01-06 LAB — FERRITIN: Ferritin: 157 ng/mL (ref 22–322)

## 2013-01-06 MED ORDER — EPOETIN ALFA 20000 UNIT/ML IJ SOLN
INTRAMUSCULAR | Status: AC
  Filled 2013-01-06: qty 1

## 2013-01-06 MED ORDER — EPOETIN ALFA 20000 UNIT/ML IJ SOLN
20000.0000 [IU] | INTRAMUSCULAR | Status: DC
  Administered 2013-01-06: 20000 [IU] via SUBCUTANEOUS

## 2013-01-08 ENCOUNTER — Ambulatory Visit (INDEPENDENT_AMBULATORY_CARE_PROVIDER_SITE_OTHER): Payer: Medicare Other | Admitting: Internal Medicine

## 2013-01-08 ENCOUNTER — Encounter: Payer: Self-pay | Admitting: Internal Medicine

## 2013-01-08 VITALS — BP 110/68 | HR 64 | Temp 98.0°F | Resp 16 | Wt 157.0 lb

## 2013-01-08 DIAGNOSIS — E785 Hyperlipidemia, unspecified: Secondary | ICD-10-CM

## 2013-01-08 DIAGNOSIS — I2510 Atherosclerotic heart disease of native coronary artery without angina pectoris: Secondary | ICD-10-CM

## 2013-01-08 DIAGNOSIS — N259 Disorder resulting from impaired renal tubular function, unspecified: Secondary | ICD-10-CM

## 2013-01-08 DIAGNOSIS — D649 Anemia, unspecified: Secondary | ICD-10-CM

## 2013-01-08 DIAGNOSIS — M109 Gout, unspecified: Secondary | ICD-10-CM

## 2013-01-08 DIAGNOSIS — I10 Essential (primary) hypertension: Secondary | ICD-10-CM

## 2013-01-08 MED ORDER — FUROSEMIDE 80 MG PO TABS: 40.0000 mg | ORAL_TABLET | Freq: Every day | ORAL | Status: DC | PRN

## 2013-01-08 NOTE — Assessment & Plan Note (Signed)
Watching labs 

## 2013-01-08 NOTE — Assessment & Plan Note (Signed)
No relapse 

## 2013-01-08 NOTE — Patient Instructions (Signed)
No added salt diet

## 2013-01-08 NOTE — Assessment & Plan Note (Signed)
CBC

## 2013-01-08 NOTE — Assessment & Plan Note (Signed)
Continue with current prescription therapy as reflected on the Med list.  

## 2013-01-08 NOTE — Assessment & Plan Note (Addendum)
BP Readings from Last 3 Encounters:  01/08/13 110/68  01/06/13 106/66  12/09/12 152/64   NAS diet

## 2013-01-08 NOTE — Progress Notes (Signed)
Patient ID: Robert Ritter, male   DOB: September 22, 1919, 79 y.o.   MRN: [id number]   Subjective:    HPI  The patient presents for a follow-up of  chronic hypertension, chronic dyslipidemia, gout controlled with medicines. Doing well. F/u AAA, BPH    BP Readings from Last 3 Encounters:  01/08/13 110/68  01/06/13 106/66  12/09/12 152/64   Wt Readings from Last 3 Encounters:  01/08/13 157 lb (71.215 kg)  10/09/12 164 lb (74.39 kg)  08/14/12 165 lb (74.844 kg)   Review of Systems  Constitutional: Negative for appetite change, fatigue and unexpected weight change.  HENT: Negative for nosebleeds, congestion, sore throat, sneezing, trouble swallowing and neck pain.   Eyes: Negative for itching and visual disturbance.  Respiratory: Negative for cough.   Cardiovascular: Negative for chest pain, palpitations and leg swelling.  Gastrointestinal: Negative for nausea, diarrhea, blood in stool and abdominal distention.  Genitourinary: Negative for frequency and hematuria.  Musculoskeletal: Positive for joint swelling, arthralgias and gait problem. Negative for back pain.       Stiff  Skin: Negative for rash.  Neurological: Negative for dizziness, tremors, speech difficulty and weakness.  Psychiatric/Behavioral: Negative for suicidal ideas, sleep disturbance, dysphoric mood and agitation. The patient is not nervous/anxious.        Objective:   Physical Exam  Constitutional: He is oriented to person, place, and time. He appears well-developed.  HENT:  Mouth/Throat: Oropharynx is clear and moist. No oropharyngeal exudate.  Eyes: Conjunctivae are normal. Pupils are equal, round, and reactive to light.  Neck: Normal range of motion. No JVD present. No thyromegaly present.  Cardiovascular: Normal rate, regular rhythm, normal heart sounds and intact distal pulses.  Exam reveals no gallop and no friction rub.   No murmur heard. Pulmonary/Chest: Effort normal and breath sounds normal. No  respiratory distress. He has no wheezes. He has no rales. He exhibits no tenderness.  Abdominal: Soft. Bowel sounds are normal. He exhibits no distension and no mass. There is no tenderness. There is no rebound and no guarding.  Musculoskeletal: He exhibits edema. He exhibits no tenderness.  ROM is decreased Trace edema B  Lymphadenopathy:    He has cervical adenopathy.  Neurological: He is alert and oriented to person, place, and time. He has normal reflexes. No cranial nerve deficit. He exhibits normal muscle tone. Coordination normal.  Skin: Skin is warm and dry. No rash noted.  Nose and L cheek growth  Psychiatric: He has a normal mood and affect. His behavior is normal. Judgment and thought content normal.  patchy rash - dry skin on the trunk  Str leg elev is neg B   . Lab Results  Component Value Date   WBC 6.7 07/12/2012   HGB 11.7* 01/06/2013   HCT 36.4* 07/12/2012   PLT 159.0 07/12/2012   GLUCOSE 89 07/12/2012   CHOL 117 06/16/2011   TRIG 75.0 06/16/2011   HDL 42.00 06/16/2011   LDLCALC 60 06/16/2011   ALT 11 09/16/2011   AST 16 09/16/2011   NA 141 07/12/2012   K 4.2 07/12/2012   CL 104 07/12/2012   CREATININE 1.5 07/12/2012   BUN 32* 07/12/2012   CO2 30 07/12/2012   TSH 1.80 06/16/2011   PSA 13.73* 06/16/2011         Assessment & Plan:

## 2013-01-11 ENCOUNTER — Encounter: Payer: Self-pay | Admitting: Internal Medicine

## 2013-02-03 ENCOUNTER — Encounter (HOSPITAL_COMMUNITY)
Admission: RE | Admit: 2013-02-03 | Discharge: 2013-02-03 | Disposition: A | Discharge status: Home / Self Care | Payer: Medicare Other | Source: Ambulatory Visit | Attending: Nephrology | Admitting: Nephrology

## 2013-02-03 DIAGNOSIS — D638 Anemia in other chronic diseases classified elsewhere: Secondary | ICD-10-CM | POA: Insufficient documentation

## 2013-02-03 DIAGNOSIS — N183 Chronic kidney disease, stage 3 unspecified: Secondary | ICD-10-CM | POA: Insufficient documentation

## 2013-02-03 LAB — POCT HEMOGLOBIN-HEMACUE: Hemoglobin: 11.2 g/dL — ABNORMAL LOW (ref 13.0–17.0)

## 2013-02-03 MED ORDER — EPOETIN ALFA 20000 UNIT/ML IJ SOLN: 20000.0000 [IU] | INTRAMUSCULAR | Status: DC

## 2013-02-03 MED ORDER — EPOETIN ALFA 20000 UNIT/ML IJ SOLN
INTRAMUSCULAR | Status: AC
  Administered 2013-02-03: 20000 [IU] via SUBCUTANEOUS
  Filled 2013-02-03: qty 1

## 2013-02-18 ENCOUNTER — Encounter (INDEPENDENT_AMBULATORY_CARE_PROVIDER_SITE_OTHER): Payer: Medicare Other

## 2013-02-18 ENCOUNTER — Ambulatory Visit (INDEPENDENT_AMBULATORY_CARE_PROVIDER_SITE_OTHER): Payer: Medicare Other | Admitting: Cardiovascular Disease

## 2013-02-18 ENCOUNTER — Encounter: Payer: Self-pay | Admitting: Cardiovascular Disease

## 2013-02-18 VITALS — BP 160/62 | HR 51 | Ht 70.0 in | Wt 159.0 lb

## 2013-02-18 DIAGNOSIS — I2510 Atherosclerotic heart disease of native coronary artery without angina pectoris: Secondary | ICD-10-CM

## 2013-02-18 DIAGNOSIS — I714 Abdominal aortic aneurysm, without rupture: Secondary | ICD-10-CM

## 2013-02-18 DIAGNOSIS — E785 Hyperlipidemia, unspecified: Secondary | ICD-10-CM

## 2013-02-18 LAB — LIPID PANEL
Total CHOL/HDL Ratio: 2
Triglycerides: 59 mg/dL (ref 0.0–149.0)

## 2013-02-18 LAB — HEPATIC FUNCTION PANEL
AST: 18 U/L (ref 0–37)
Albumin: 3.8 g/dL (ref 3.5–5.2)
Alkaline Phosphatase: 48 U/L (ref 39–117)
Total Bilirubin: 0.7 mg/dL (ref 0.3–1.2)

## 2013-02-18 LAB — BASIC METABOLIC PANEL
CO2: 30 mEq/L (ref 19–32)
Calcium: 8.9 mg/dL (ref 8.4–10.5)
Creatinine, Ser: 1.3 mg/dL (ref 0.4–1.5)
Glucose, Bld: 98 mg/dL (ref 70–99)

## 2013-02-18 NOTE — Patient Instructions (Addendum)
Your physician recommends that you have lab work today: FASTING LIPID, LIVER and BMP  Your physician wants you to follow-up in: 6 MONTHS with Dr Excell Seltzer. You will receive a reminder letter in the mail two months in advance. If you don't receive a letter, please call our office to schedule the follow-up appointment.  Your physician has requested that you have an abdominal aorta duplex in 6 MONTHS. During this test, an ultrasound is used to evaluate the aorta. Allow 30 minutes for this exam. Do not eat after midnight the day before and avoid carbonated beverages  Your physician recommends that you continue on your current medications as directed. Please refer to the Current Medication list given to you today.

## 2013-02-20 ENCOUNTER — Other Ambulatory Visit: Payer: Self-pay

## 2013-02-20 MED ORDER — SIMVASTATIN 20 MG PO TABS: 20.0000 mg | ORAL_TABLET | Freq: Every day | ORAL | Status: DC

## 2013-02-22 ENCOUNTER — Encounter: Payer: Self-pay | Admitting: Cardiovascular Disease

## 2013-02-22 NOTE — Progress Notes (Signed)
   HPI:  Robert Ritter returns for follow-up evaluation. He is followed for CAD s/p CABG and abdominal aortic aneurysm. His aneurysm has been followed with serial ultrasound studies and has been approximately 5.0 cm in diameter.  He complains of increasing fatigue and leg weakness. Can walk shorter distances than in the past. No chest pain. Mild DOE unchanged. No edema, orthopnea, or PND.   Outpatient Encounter Prescriptions as of 02/18/2013  Medication Sig Dispense Refill  . aspirin 325 MG tablet Take 1/2 tablet      . bimatoprost (LUMIGAN) 0.03 % ophthalmic drops Place 1 drop into both eyes at bedtime.        . fish oil-omega-3 fatty acids 1000 MG capsule Take 1 g by mouth 2 (two) times daily.      . furosemide (LASIX) 80 MG tablet Take 0.5 tablets (40 mg total) by mouth daily as needed for edema.  90 tablet  3  . nitroGLYCERIN (NITROSTAT) 0.4 MG SL tablet Place 0.4 mg under the tongue as needed. Chest pain      . ondansetron (ZOFRAN) 4 MG/2ML SOLN injection Inject 4 mg into the vein once.      . predniSONE (DELTASONE) 10 MG tablet Take 10 mg by mouth as needed. Take medication for gout flare ups      . telmisartan (MICARDIS) 80 MG tablet Take 80 mg by mouth as needed.       . terazosin (HYTRIN) 5 MG capsule Take 1 capsule (5 mg total) by mouth at bedtime.  100 capsule  2  . triamcinolone cream (KENALOG) 0.5 % Apply topically 2 (two) times daily.  120 g  3  . [DISCONTINUED] ezetimibe-simvastatin (VYTORIN) 10-40 MG per tablet Take 1 tablet by mouth at bedtime.         No facility-administered encounter medications on file as of 02/18/2013.    Allergies  Allergen Reactions  . Sulfa Antibiotics   . Sulfacetamide Sodium-Sulfur     Past Medical History  Diagnosis Date  . CAD (coronary artery disease)     S/P CABG and multiple PCI's  . Hyperlipidemia   . HTN (hypertension)   . AAA (abdominal aortic aneurysm)     5 cm on 2011 assessment  . Gout 2009  . Renal insufficiency   . Elevated  PSA     Dr Earlene Plater    ROS: Negative except as per HPI  BP 160/62  Pulse 51  Ht 5\' 10"  (1.778 m)  Wt 159 lb (72.122 kg)  BMI 22.81 kg/m2  SpO2 97%  PHYSICAL EXAM: Pt is alert and oriented, elderly male in NAD HEENT: normal Neck: JVP - normal, carotids 2+= without bruits Lungs: CTA bilaterally CV: RRR without murmur or gallop Abd: soft, NT, Positive BS Ext: no C/C/E, distal pulses intact and equal Skin: warm/dry no rash  EKG:  Sinus brady 51 bpm, age-indeterminate septal infarct, nonspecific ST abnormality  ASSESSMENT AND PLAN: 1. Abdominal aortic aneurysm - stable by ultrasound continue conservative management. Duplex today shows AAA stable at 5.1x5.2 cm.  2. CAD s/p CABG - stable without angina. Managed with ASA, Vytorin. BP intermittently too low for beta-blocker.   3. Fatigue/weakness. Suspect multifactorial. Will check labs today.   4. Hyperlipidemia. See below   Addendum: labs show total cholesterol 91. Will decrease statin to simvastatin 20 mg daily. Other labs unremarkable.   Tonny Bollman 02/22/2013 7:02 AM

## 2013-02-27 ENCOUNTER — Encounter: Payer: Self-pay | Admitting: Internal Medicine

## 2013-02-27 ENCOUNTER — Ambulatory Visit (INDEPENDENT_AMBULATORY_CARE_PROVIDER_SITE_OTHER): Payer: Medicare Other | Admitting: Internal Medicine

## 2013-02-27 VITALS — BP 180/72 | HR 72 | Temp 97.0°F | Resp 16 | Wt 155.0 lb

## 2013-02-27 DIAGNOSIS — K140 Glossitis: Secondary | ICD-10-CM

## 2013-02-27 DIAGNOSIS — L570 Actinic keratosis: Secondary | ICD-10-CM

## 2013-02-27 MED ORDER — TRIAMCINOLONE ACETONIDE 0.1 % MT PSTE: PASTE | OROMUCOSAL | Status: DC

## 2013-02-27 MED ORDER — ACYCLOVIR 400 MG PO TABS: 400.0000 mg | ORAL_TABLET | Freq: Three times a day (TID) | ORAL | Status: DC

## 2013-02-27 NOTE — Progress Notes (Signed)
Subjective:   C/o skin lesions  Mouth Lesions  The current episode started 5 to 7 days ago. The problem has been unchanged. The problem is moderate. Relieved by: ASA. The symptoms are aggravated by eating. Associated symptoms include mouth sores. Pertinent negatives include no eye itching, no diarrhea, no nausea, no congestion, no sore throat, no neck pain, no cough and no rash.    The patient presents for a follow-up of  chronic hypertension, chronic dyslipidemia, gout controlled with medicines. Doing well. F/u AAA, BPH    BP Readings from Last 3 Encounters:  02/27/13 180/72  02/18/13 160/62  02/03/13 144/55   Wt Readings from Last 3 Encounters:  02/27/13 155 lb (70.308 kg)  02/18/13 159 lb (72.122 kg)  01/08/13 157 lb (71.215 kg)   Review of Systems  Constitutional: Negative for appetite change, fatigue and unexpected weight change.  HENT: Positive for mouth sores. Negative for nosebleeds, congestion, sore throat, sneezing, trouble swallowing and neck pain.   Eyes: Negative for itching and visual disturbance.  Respiratory: Negative for cough.   Cardiovascular: Negative for chest pain, palpitations and leg swelling.  Gastrointestinal: Negative for nausea, diarrhea, blood in stool and abdominal distention.  Genitourinary: Negative for frequency and hematuria.  Musculoskeletal: Positive for joint swelling, arthralgias and gait problem. Negative for back pain.       Stiff  Skin: Negative for rash.  Neurological: Negative for dizziness, tremors, speech difficulty and weakness.  Psychiatric/Behavioral: Negative for suicidal ideas, sleep disturbance, dysphoric mood and agitation. The patient is not nervous/anxious.        Objective:   Physical Exam  Constitutional: He is oriented to person, place, and time. He appears well-developed.  HENT:  Mouth/Throat: Oropharynx is clear and moist. No oropharyngeal exudate.  Eyes: Conjunctivae are normal. Pupils are equal, round, and  reactive to light.  Neck: Normal range of motion. No JVD present. No thyromegaly present.  Cardiovascular: Normal rate, regular rhythm, normal heart sounds and intact distal pulses.  Exam reveals no gallop and no friction rub.   No murmur heard. Pulmonary/Chest: Effort normal and breath sounds normal. No respiratory distress. He has no wheezes. He has no rales. He exhibits no tenderness.  Abdominal: Soft. Bowel sounds are normal. He exhibits no distension and no mass. There is no tenderness. There is no rebound and no guarding.  Musculoskeletal: He exhibits edema. He exhibits no tenderness.  ROM is decreased Trace edema B  Lymphadenopathy:    He has cervical adenopathy.  Neurological: He is alert and oriented to person, place, and time. He has normal reflexes. No cranial nerve deficit. He exhibits normal muscle tone. Coordination normal.  Skin: Skin is warm and dry. No rash noted.  Nose and L cheek growth  Psychiatric: He has a normal mood and affect. His behavior is normal. Judgment and thought content normal.  patchy rash - dry skin on the trunk  Str leg elev is neg B Face - AKs 5 mm ulcer on L lat tongue  . Lab Results  Component Value Date   WBC 6.7 07/12/2012   HGB 11.2* 02/03/2013   HCT 36.4* 07/12/2012   PLT 159.0 07/12/2012   GLUCOSE 98 02/18/2013   CHOL 91 02/18/2013   TRIG 59.0 02/18/2013   HDL 39.90 02/18/2013   LDLCALC 39 02/18/2013   ALT 14 02/18/2013   AST 18 02/18/2013   NA 139 02/18/2013   K 4.1 02/18/2013   CL 105 02/18/2013   CREATININE 1.3 02/18/2013   BUN  23 02/18/2013   CO2 30 02/18/2013   TSH 1.80 06/16/2011   PSA 13.73* 06/16/2011     Procedure Note :     Procedure : Cryosurgery   Indication:   Actinic keratosis(es)   Risks including unsuccessful procedure , bleeding, infection, bruising, scar, a need for a repeat  procedure and others were explained to the patient in detail as well as the benefits. Informed consent was obtained verbally.   5  lesion(s) on  face were treated with liquid nitrogen on a Q-tip in a usual fasion . Band-Aid was applied and antibiotic ointment was given for a later use.   Tolerated well. Complications none.   Postprocedure instructions :     Keep the wounds clean. You can wash them with liquid soap and water. Pat dry with gauze or a Kleenex tissue  Before applying antibiotic ointment and a Band-Aid.   You need to report immediately  if  any signs of infection develop.        Assessment & Plan:

## 2013-02-27 NOTE — Assessment & Plan Note (Signed)
Will treat - Cryo

## 2013-02-27 NOTE — Patient Instructions (Addendum)
   Postprocedure instructions :     Keep the wounds clean. You can wash them with liquid soap and water. Pat dry with gauze or a Kleenex tissue  Before applying antibiotic ointment and a Band-Aid.   You need to report immediately  if  any signs of infection develop.    

## 2013-02-27 NOTE — Assessment & Plan Note (Signed)
9/14 L ?ulcer ??H. Simplex Kenalog in dental paste Acyclovir po

## 2013-02-28 ENCOUNTER — Other Ambulatory Visit (HOSPITAL_COMMUNITY): Payer: Self-pay | Admitting: *Deleted

## 2013-03-03 ENCOUNTER — Encounter (HOSPITAL_COMMUNITY)
Admission: RE | Admit: 2013-03-03 | Discharge: 2013-03-03 | Disposition: A | Discharge status: Home / Self Care | Payer: Medicare Other | Source: Ambulatory Visit | Attending: Nephrology | Admitting: Nephrology

## 2013-03-03 DIAGNOSIS — N183 Chronic kidney disease, stage 3 unspecified: Secondary | ICD-10-CM | POA: Insufficient documentation

## 2013-03-03 DIAGNOSIS — D638 Anemia in other chronic diseases classified elsewhere: Secondary | ICD-10-CM | POA: Insufficient documentation

## 2013-03-03 LAB — POCT HEMOGLOBIN-HEMACUE: Hemoglobin: 10.7 g/dL — ABNORMAL LOW (ref 13.0–17.0)

## 2013-03-03 MED ORDER — EPOETIN ALFA 20000 UNIT/ML IJ SOLN
INTRAMUSCULAR | Status: AC
  Filled 2013-03-03: qty 1

## 2013-03-03 MED ORDER — EPOETIN ALFA 20000 UNIT/ML IJ SOLN
20000.0000 [IU] | INTRAMUSCULAR | Status: DC
  Administered 2013-03-03: 20000 [IU] via SUBCUTANEOUS
  Filled 2013-03-03: qty 1

## 2013-03-31 ENCOUNTER — Encounter (HOSPITAL_COMMUNITY)
Admission: RE | Admit: 2013-03-31 | Discharge: 2013-03-31 | Disposition: A | Discharge status: Home / Self Care | Payer: Medicare Other | Source: Ambulatory Visit | Attending: Nephrology | Admitting: Nephrology

## 2013-03-31 DIAGNOSIS — N183 Chronic kidney disease, stage 3 unspecified: Secondary | ICD-10-CM | POA: Insufficient documentation

## 2013-03-31 DIAGNOSIS — Z5181 Encounter for therapeutic drug level monitoring: Secondary | ICD-10-CM | POA: Insufficient documentation

## 2013-03-31 DIAGNOSIS — D631 Anemia in chronic kidney disease: Secondary | ICD-10-CM | POA: Insufficient documentation

## 2013-03-31 LAB — IRON AND TIBC
Iron: 55 ug/dL (ref 42–135)
TIBC: 252 ug/dL (ref 215–435)

## 2013-03-31 MED ORDER — EPOETIN ALFA 20000 UNIT/ML IJ SOLN: 20000.0000 [IU] | INTRAMUSCULAR | Status: DC

## 2013-03-31 MED ORDER — EPOETIN ALFA 20000 UNIT/ML IJ SOLN
INTRAMUSCULAR | Status: AC
  Administered 2013-03-31: 20000 [IU] via SUBCUTANEOUS
  Filled 2013-03-31: qty 1

## 2013-04-16 ENCOUNTER — Encounter: Payer: Self-pay | Admitting: Internal Medicine

## 2013-04-16 ENCOUNTER — Ambulatory Visit (INDEPENDENT_AMBULATORY_CARE_PROVIDER_SITE_OTHER): Payer: Medicare Other | Admitting: Internal Medicine

## 2013-04-16 VITALS — BP 120/62 | HR 76 | Temp 96.8°F | Resp 16 | Wt 160.0 lb

## 2013-04-16 DIAGNOSIS — E785 Hyperlipidemia, unspecified: Secondary | ICD-10-CM

## 2013-04-16 DIAGNOSIS — I10 Essential (primary) hypertension: Secondary | ICD-10-CM

## 2013-04-16 DIAGNOSIS — I2510 Atherosclerotic heart disease of native coronary artery without angina pectoris: Secondary | ICD-10-CM

## 2013-04-16 DIAGNOSIS — Z23 Encounter for immunization: Secondary | ICD-10-CM

## 2013-04-16 DIAGNOSIS — N259 Disorder resulting from impaired renal tubular function, unspecified: Secondary | ICD-10-CM

## 2013-04-16 DIAGNOSIS — I714 Abdominal aortic aneurysm, without rupture, unspecified: Secondary | ICD-10-CM

## 2013-04-16 NOTE — Assessment & Plan Note (Signed)
Continue with current prescription therapy as reflected on the Med list.  

## 2013-04-16 NOTE — Assessment & Plan Note (Signed)
Per Dr Excell Seltzer

## 2013-04-16 NOTE — Progress Notes (Signed)
Patient ID: Robert Ritter, male   DOB: [birth year], 79 y.o.   MRN: [id number] Patient ID: Robert Ritter, male   DOB: 1919-08-07, 79 y.o.   MRN: [id number]   Subjective:    HPI  The patient presents for a follow-up of  chronic hypertension, chronic dyslipidemia, gout controlled with medicines. Doing well. F/u AAA, BPH    BP Readings from Last 3 Encounters:  04/16/13 120/62  03/31/13 170/70  03/03/13 177/80   Wt Readings from Last 3 Encounters:  04/16/13 160 lb (72.576 kg)  02/27/13 155 lb (70.308 kg)  02/18/13 159 lb (72.122 kg)   Review of Systems  Constitutional: Negative for appetite change, fatigue and unexpected weight change.  HENT: Negative for congestion, nosebleeds, sneezing, sore throat and trouble swallowing.   Eyes: Negative for itching and visual disturbance.  Respiratory: Negative for cough.   Cardiovascular: Negative for chest pain, palpitations and leg swelling.  Gastrointestinal: Negative for nausea, diarrhea, blood in stool and abdominal distention.  Genitourinary: Negative for frequency and hematuria.  Musculoskeletal: Positive for arthralgias, gait problem and joint swelling. Negative for back pain and neck pain.       Stiff  Skin: Negative for rash.  Neurological: Negative for dizziness, tremors, speech difficulty and weakness.  Psychiatric/Behavioral: Negative for suicidal ideas, sleep disturbance, dysphoric mood and agitation. The patient is not nervous/anxious.        Objective:   Physical Exam  Constitutional: He is oriented to person, place, and time. He appears well-developed.  HENT:  Mouth/Throat: Oropharynx is clear and moist. No oropharyngeal exudate.  Eyes: Conjunctivae are normal. Pupils are equal, round, and reactive to light.  Neck: Normal range of motion. No JVD present. No thyromegaly present.  Cardiovascular: Normal rate, regular rhythm, normal heart sounds and intact distal pulses.  Exam reveals no gallop and no friction rub.    No murmur heard. Pulmonary/Chest: Effort normal and breath sounds normal. No respiratory distress. He has no wheezes. He has no rales. He exhibits no tenderness.  Abdominal: Soft. Bowel sounds are normal. He exhibits no distension and no mass. There is no tenderness. There is no rebound and no guarding.  Musculoskeletal: He exhibits edema. He exhibits no tenderness.  ROM is decreased Trace edema B  Lymphadenopathy:    He has cervical adenopathy.  Neurological: He is alert and oriented to person, place, and time. He has normal reflexes. No cranial nerve deficit. He exhibits normal muscle tone. Coordination normal.  Skin: Skin is warm and dry. No rash noted.  Nose and L cheek growth  Psychiatric: He has a normal mood and affect. His behavior is normal. Judgment and thought content normal.  patchy rash - dry skin on the trunk  Str leg elev is neg B    . Lab Results  Component Value Date   WBC 6.7 07/12/2012   HGB 11.5* 03/31/2013   HCT 36.4* 07/12/2012   PLT 159.0 07/12/2012   GLUCOSE 98 02/18/2013   CHOL 91 02/18/2013   TRIG 59.0 02/18/2013   HDL 39.90 02/18/2013   LDLCALC 39 02/18/2013   ALT 14 02/18/2013   AST 18 02/18/2013   NA 139 02/18/2013   K 4.1 02/18/2013   CL 105 02/18/2013   CREATININE 1.3 02/18/2013   BUN 23 02/18/2013   CO2 30 02/18/2013   TSH 1.80 06/16/2011   PSA 13.73* 06/16/2011         Assessment & Plan:

## 2013-04-16 NOTE — Assessment & Plan Note (Signed)
Watching labs 

## 2013-04-28 ENCOUNTER — Encounter (HOSPITAL_COMMUNITY): Payer: Medicare Other

## 2013-05-05 ENCOUNTER — Encounter (HOSPITAL_COMMUNITY)
Admission: RE | Admit: 2013-05-05 | Discharge: 2013-05-05 | Disposition: A | Discharge status: Home / Self Care | Payer: Medicare Other | Source: Ambulatory Visit | Attending: Nephrology | Admitting: Nephrology

## 2013-05-05 DIAGNOSIS — N183 Chronic kidney disease, stage 3 unspecified: Secondary | ICD-10-CM | POA: Insufficient documentation

## 2013-05-05 DIAGNOSIS — D638 Anemia in other chronic diseases classified elsewhere: Secondary | ICD-10-CM | POA: Insufficient documentation

## 2013-05-05 DIAGNOSIS — I129 Hypertensive chronic kidney disease with stage 1 through stage 4 chronic kidney disease, or unspecified chronic kidney disease: Secondary | ICD-10-CM | POA: Insufficient documentation

## 2013-05-05 MED ORDER — EPOETIN ALFA 20000 UNIT/ML IJ SOLN: 20000.0000 [IU] | INTRAMUSCULAR | Status: DC

## 2013-05-05 MED ORDER — EPOETIN ALFA 20000 UNIT/ML IJ SOLN
INTRAMUSCULAR | Status: AC
  Administered 2013-05-05: 08:00:00 20000 [IU] via SUBCUTANEOUS
  Filled 2013-05-05: qty 1

## 2013-05-30 ENCOUNTER — Other Ambulatory Visit (HOSPITAL_COMMUNITY): Payer: Self-pay | Admitting: *Deleted

## 2013-06-02 ENCOUNTER — Encounter (HOSPITAL_COMMUNITY)
Admission: RE | Admit: 2013-06-02 | Discharge: 2013-06-02 | Disposition: A | Discharge status: Home / Self Care | Payer: Medicare Other | Source: Ambulatory Visit | Attending: Nephrology | Admitting: Nephrology

## 2013-06-02 DIAGNOSIS — D638 Anemia in other chronic diseases classified elsewhere: Secondary | ICD-10-CM | POA: Insufficient documentation

## 2013-06-02 DIAGNOSIS — N183 Chronic kidney disease, stage 3 unspecified: Secondary | ICD-10-CM | POA: Insufficient documentation

## 2013-06-02 LAB — IRON AND TIBC
Iron: 69 ug/dL (ref 42–135)
Saturation Ratios: 25 % (ref 20–55)
TIBC: 271 ug/dL (ref 215–435)
UIBC: 202 ug/dL (ref 125–400)

## 2013-06-02 LAB — FERRITIN: Ferritin: 129 ng/mL (ref 22–322)

## 2013-06-02 LAB — POCT HEMOGLOBIN-HEMACUE: Hemoglobin: 11.9 g/dL — ABNORMAL LOW (ref 13.0–17.0)

## 2013-06-02 MED ORDER — EPOETIN ALFA 20000 UNIT/ML IJ SOLN
20000.0000 [IU] | INTRAMUSCULAR | Status: DC
  Administered 2013-06-02: 20000 [IU] via SUBCUTANEOUS

## 2013-06-02 MED ORDER — EPOETIN ALFA 20000 UNIT/ML IJ SOLN
INTRAMUSCULAR | Status: AC
  Administered 2013-06-02: 20000 [IU] via SUBCUTANEOUS
  Filled 2013-06-02: qty 1

## 2013-06-30 ENCOUNTER — Encounter (HOSPITAL_COMMUNITY)
Admission: RE | Admit: 2013-06-30 | Discharge: 2013-06-30 | Disposition: A | Discharge status: Home / Self Care | Payer: Medicare Other | Source: Ambulatory Visit | Attending: Nephrology | Admitting: Nephrology

## 2013-06-30 DIAGNOSIS — D638 Anemia in other chronic diseases classified elsewhere: Secondary | ICD-10-CM | POA: Insufficient documentation

## 2013-06-30 DIAGNOSIS — N183 Chronic kidney disease, stage 3 unspecified: Secondary | ICD-10-CM | POA: Insufficient documentation

## 2013-06-30 MED ORDER — EPOETIN ALFA 20000 UNIT/ML IJ SOLN
20000.0000 [IU] | INTRAMUSCULAR | Status: DC
  Administered 2013-06-30: 20000 [IU] via SUBCUTANEOUS

## 2013-06-30 MED ORDER — EPOETIN ALFA 20000 UNIT/ML IJ SOLN
INTRAMUSCULAR | Status: AC
  Filled 2013-06-30: qty 1

## 2013-07-01 LAB — POCT HEMOGLOBIN-HEMACUE: HEMOGLOBIN: 11 g/dL — AB (ref 13.0–17.0)

## 2013-07-21 ENCOUNTER — Other Ambulatory Visit: Payer: Medicare Other

## 2013-07-23 ENCOUNTER — Ambulatory Visit (INDEPENDENT_AMBULATORY_CARE_PROVIDER_SITE_OTHER): Payer: Medicare Other | Admitting: Internal Medicine

## 2013-07-23 ENCOUNTER — Encounter: Payer: Self-pay | Admitting: Internal Medicine

## 2013-07-23 VITALS — BP 150/72 | HR 86 | Temp 97.1°F | Resp 16 | Wt 165.0 lb

## 2013-07-23 DIAGNOSIS — I10 Essential (primary) hypertension: Secondary | ICD-10-CM

## 2013-07-23 DIAGNOSIS — I2510 Atherosclerotic heart disease of native coronary artery without angina pectoris: Secondary | ICD-10-CM

## 2013-07-23 DIAGNOSIS — M109 Gout, unspecified: Secondary | ICD-10-CM

## 2013-07-23 DIAGNOSIS — N259 Disorder resulting from impaired renal tubular function, unspecified: Secondary | ICD-10-CM

## 2013-07-23 DIAGNOSIS — M1990 Unspecified osteoarthritis, unspecified site: Secondary | ICD-10-CM

## 2013-07-23 DIAGNOSIS — E785 Hyperlipidemia, unspecified: Secondary | ICD-10-CM

## 2013-07-23 DIAGNOSIS — Z23 Encounter for immunization: Secondary | ICD-10-CM

## 2013-07-23 MED ORDER — CELECOXIB 200 MG PO CAPS: 200.0000 mg | ORAL_CAPSULE | Freq: Every day | ORAL | Status: DC | PRN

## 2013-07-23 NOTE — Assessment & Plan Note (Signed)
Continue with current prescription therapy as reflected on the Med list.  

## 2013-07-23 NOTE — Assessment & Plan Note (Signed)
Celebrex prn (has used before)

## 2013-07-23 NOTE — Progress Notes (Signed)
Pre visit review using our clinic review tool, if applicable. No additional management support is needed unless otherwise documented below in the visit note. 

## 2013-07-23 NOTE — Assessment & Plan Note (Addendum)
Labs Careful w/Celebrex

## 2013-07-28 ENCOUNTER — Telehealth: Payer: Self-pay | Admitting: Internal Medicine

## 2013-07-28 ENCOUNTER — Encounter (HOSPITAL_COMMUNITY)
Admission: RE | Admit: 2013-07-28 | Discharge: 2013-07-28 | Disposition: A | Discharge status: Home / Self Care | Payer: Medicare Other | Source: Ambulatory Visit | Attending: Nephrology | Admitting: Nephrology

## 2013-07-28 DIAGNOSIS — N183 Chronic kidney disease, stage 3 unspecified: Secondary | ICD-10-CM | POA: Insufficient documentation

## 2013-07-28 DIAGNOSIS — D638 Anemia in other chronic diseases classified elsewhere: Secondary | ICD-10-CM | POA: Insufficient documentation

## 2013-07-28 LAB — POCT HEMOGLOBIN-HEMACUE: Hemoglobin: 11.6 g/dL — ABNORMAL LOW (ref 13.0–17.0)

## 2013-07-28 LAB — IRON AND TIBC
IRON: 60 ug/dL (ref 42–135)
SATURATION RATIOS: 23 % (ref 20–55)
TIBC: 263 ug/dL (ref 215–435)
UIBC: 203 ug/dL (ref 125–400)

## 2013-07-28 LAB — FERRITIN: Ferritin: 114 ng/mL (ref 22–322)

## 2013-07-28 MED ORDER — EPOETIN ALFA 20000 UNIT/ML IJ SOLN
INTRAMUSCULAR | Status: AC
  Administered 2013-07-28: 08:00:00 20000 [IU] via SUBCUTANEOUS
  Filled 2013-07-28: qty 1

## 2013-07-28 MED ORDER — EPOETIN ALFA 20000 UNIT/ML IJ SOLN
20000.0000 [IU] | INTRAMUSCULAR | Status: DC
  Administered 2013-07-28: 20000 [IU] via SUBCUTANEOUS

## 2013-07-28 NOTE — Telephone Encounter (Signed)
Relevant patient education mailed to patient.  

## 2013-08-15 ENCOUNTER — Other Ambulatory Visit: Payer: Self-pay | Admitting: Dermatology

## 2013-08-22 ENCOUNTER — Other Ambulatory Visit (HOSPITAL_COMMUNITY): Payer: Self-pay | Admitting: *Deleted

## 2013-08-25 ENCOUNTER — Other Ambulatory Visit: Payer: Self-pay | Admitting: Internal Medicine

## 2013-08-25 ENCOUNTER — Encounter (HOSPITAL_COMMUNITY)
Admission: RE | Admit: 2013-08-25 | Discharge: 2013-08-25 | Disposition: A | Discharge status: Home / Self Care | Payer: Medicare Other | Source: Ambulatory Visit | Attending: Nephrology | Admitting: Nephrology

## 2013-08-25 DIAGNOSIS — D638 Anemia in other chronic diseases classified elsewhere: Secondary | ICD-10-CM | POA: Insufficient documentation

## 2013-08-25 DIAGNOSIS — N183 Chronic kidney disease, stage 3 unspecified: Secondary | ICD-10-CM | POA: Insufficient documentation

## 2013-08-25 LAB — POCT HEMOGLOBIN-HEMACUE: Hemoglobin: 11.8 g/dL — ABNORMAL LOW (ref 13.0–17.0)

## 2013-08-25 MED ORDER — EPOETIN ALFA 20000 UNIT/ML IJ SOLN
20000.0000 [IU] | INTRAMUSCULAR | Status: DC
  Administered 2013-08-25: 20000 [IU] via SUBCUTANEOUS

## 2013-08-25 MED ORDER — EPOETIN ALFA 20000 UNIT/ML IJ SOLN
INTRAMUSCULAR | Status: AC
  Filled 2013-08-25: qty 1

## 2013-08-27 ENCOUNTER — Ambulatory Visit: Payer: Medicare Other | Admitting: Cardiovascular Disease

## 2013-08-27 ENCOUNTER — Encounter (HOSPITAL_COMMUNITY): Payer: Medicare Other

## 2013-08-28 ENCOUNTER — Ambulatory Visit (INDEPENDENT_AMBULATORY_CARE_PROVIDER_SITE_OTHER): Payer: Medicare Other | Admitting: Cardiovascular Disease

## 2013-08-28 ENCOUNTER — Other Ambulatory Visit (HOSPITAL_COMMUNITY): Payer: Self-pay | Admitting: Cardiology

## 2013-08-28 ENCOUNTER — Ambulatory Visit (HOSPITAL_COMMUNITY): Payer: Medicare Other | Attending: Cardiovascular Disease | Admitting: Cardiology

## 2013-08-28 ENCOUNTER — Encounter: Payer: Self-pay | Admitting: Cardiovascular Disease

## 2013-08-28 VITALS — BP 156/70 | HR 56 | Ht 70.0 in | Wt 165.0 lb

## 2013-08-28 DIAGNOSIS — R011 Cardiac murmur, unspecified: Secondary | ICD-10-CM

## 2013-08-28 DIAGNOSIS — I10 Essential (primary) hypertension: Secondary | ICD-10-CM

## 2013-08-28 DIAGNOSIS — I714 Abdominal aortic aneurysm, without rupture, unspecified: Secondary | ICD-10-CM

## 2013-08-28 DIAGNOSIS — I2510 Atherosclerotic heart disease of native coronary artery without angina pectoris: Secondary | ICD-10-CM

## 2013-08-28 DIAGNOSIS — E785 Hyperlipidemia, unspecified: Secondary | ICD-10-CM

## 2013-08-28 LAB — BASIC METABOLIC PANEL
BUN: 32 mg/dL — ABNORMAL HIGH (ref 6–23)
CO2: 29 meq/L (ref 19–32)
CREATININE: 1.8 mg/dL — AB (ref 0.4–1.5)
Calcium: 8.8 mg/dL (ref 8.4–10.5)
Chloride: 103 mEq/L (ref 96–112)
GFR: 37.51 mL/min — ABNORMAL LOW (ref >60.00)
GLUCOSE: 84 mg/dL (ref 70–99)
Potassium: 3.9 mEq/L (ref 3.5–5.1)
SODIUM: 138 meq/L (ref 135–145)

## 2013-08-28 NOTE — Patient Instructions (Addendum)
You have been referred to Dr. Oneida Alar at [facility]  Non-Cardiac CT Angiography (CTA), is a special type of CT scan that uses a computer to produce multi-dimensional views of major blood vessels throughout the body. In CT angiography, a contrast material is injected through an IV to help visualize the blood vessels  Your physician recommends that you have lab work TODAY:  BMET  Your physician recommends that you continue on your current medications as directed. Please refer to the Current Medication list given to you today.  Your physician has requested that you have an echocardiogram. Echocardiography is a painless test that uses sound waves to create images of your heart. It provides your doctor with information about the size and shape of your heart and how well your heart's chambers and valves are working. This procedure takes approximately one hour. There are no restrictions for this procedure.  Your physician wants you to follow-up in: 6 months with Dr. Emelda Fear will receive a reminder letter in the mail two months in advance. If you don't receive a letter, please call our office to schedule the follow-up appointment.

## 2013-08-28 NOTE — Progress Notes (Signed)
HPI:  80 year old gentleman presenting for followup evaluation. The patient has a history of coronary artery disease status post CABG. He's also been followed for an infrarenal AAA. He has undergone serial duplex scanning for several years. Dating back to 2010, his abdominal aortic aneurysm measured 4.9 x 5.0 cm in maximal dimension. This morning study demonstrated maximal dimensions of 5.5 x 5.2 cm. There has not been rapid growth documented over serial studies. The patient has no abdominal pain or pelvic pain. He had an episode of chest discomfort that was transient. This occurred 2 months ago while he was doing some upper extremity exercises. He's had no symptoms since that time. He specifically denies chest pain or pressure with walking, shortness of breath, palpitations, or syncope. He continues to live independently.  Lipid Panel     Component Value Date/Time   CHOL 91 02/18/2013 1056   TRIG 59.0 02/18/2013 1056   HDL 39.90 02/18/2013 1056   CHOLHDL 2 02/18/2013 1056   VLDL 11.8 02/18/2013 1056   LDLCALC 39 02/18/2013 1056     Outpatient Encounter Prescriptions as of 08/28/2013  Medication Sig  . aspirin 325 MG tablet Take 1/2 tablet  . bimatoprost (LUMIGAN) 0.03 % ophthalmic drops Place 1 drop into both eyes at bedtime.    . celecoxib (CELEBREX) 200 MG capsule Take 1 capsule (200 mg total) by mouth daily as needed.  . fish oil-omega-3 fatty acids 1000 MG capsule Take 1 g by mouth daily.   . furosemide (LASIX) 80 MG tablet Take 0.5 tablets (40 mg total) by mouth daily as needed for edema.  . nitroGLYCERIN (NITROSTAT) 0.4 MG SL tablet Place 0.4 mg under the tongue as needed. Chest pain  . ondansetron (ZOFRAN) 4 MG/2ML SOLN injection Inject 4 mg into the vein once.  . predniSONE (DELTASONE) 10 MG tablet Take 10 mg by mouth as needed. Take medication for gout flare ups  . simvastatin (ZOCOR) 20 MG tablet Take 20 mg by mouth daily.  Marland Kitchen telmisartan (MICARDIS) 80 MG tablet Take 80 mg by mouth  as needed.   . terazosin (HYTRIN) 5 MG capsule TAKE 1 CAPSULE BY MOUTH DAILY AT BEDTIME  . triamcinolone cream (KENALOG) 0.5 % Apply topically 2 (two) times daily.  . [DISCONTINUED] atorvastatin (LIPITOR) 80 MG tablet Take 80 mg by mouth daily.  . [DISCONTINUED] VITAMIN A PO Take 24,000 Units by mouth daily.  . [DISCONTINUED] vitamin E 400 UNIT capsule Take 400 Units by mouth daily.  . [DISCONTINUED] acyclovir (ZOVIRAX) 400 MG tablet Take 1 tablet (400 mg total) by mouth 3 (three) times daily.  . [DISCONTINUED] simvastatin (ZOCOR) 20 MG tablet Take 1 tablet (20 mg total) by mouth at bedtime.  . [DISCONTINUED] triamcinolone (KENALOG) 0.1 % paste Use on tongue ulcer bid-qid    Allergies  Allergen Reactions  . Sulfa Antibiotics   . Sulfacetamide Sodium-Sulfur     Past Medical History  Diagnosis Date  . CAD (coronary artery disease)     S/P CABG and multiple PCI's  . Hyperlipidemia   . HTN (hypertension)   . AAA (abdominal aortic aneurysm)     5 cm on 2011 assessment  . Gout 2009  . Renal insufficiency   . Elevated PSA     Dr Rosana Hoes    ROS: Negative except as per HPI  BP 156/70  Pulse 56  Ht 5\' 10"  (1.778 m)  Wt 165 lb (74.844 kg)  BMI 23.68 kg/m2  PHYSICAL EXAM: Pt is alert and oriented, pleasant  elderly male in NAD HEENT: normal Neck: JVP - normal, carotids 2+= with soft bilateral bruits Lungs: CTA bilaterally CV: RRR with grade 2/6 crescendo decrescendo murmur at the right upper sternal border Abd: soft, NT, Positive BS, no hepatomegaly, palpable AAA Ext: 1+ pretibial edema bilaterally, distal pulses intact and equal Skin: warm/dry no rash  EKG:  Sinus bradycardia 56 beats per minute, possible age-indeterminate septal infarct.  ASSESSMENT AND PLAN: 1. Abdominal aortic aneurysm. The patient has had slow growth of his AAA over the past 5 years. His maximal diameter is now 5.5 cm by duplex ultrasonography. I discussed his situation at length. He is in relatively good  health considering his advanced age and he continues to live independently with a good functional status. I think it is reasonable to consider repair as long as it can be done via [facility]. I discussed his case with Dr. Oneida Alar of vascular surgery and will refer him for further evaluation. We'll obtain a CTA of the abdomen and pelvis prior to that evaluation to better to any 8 the anatomy of his AAA.  2. CAD status post CABG. One episode of brief chest pain 2 months ago. No symptoms since that time. Continue medical therapy.  3. Hypertension. Patient continues on Micardis and Terazosin. Continue same Rx. No beta-blocker because of bradycardia.   4. Hyperlipidemia: on simvastatin. Very low cholesterol values as documented above.   5. Heart murmur: last echo 2008. This was reviewed and showed aortic valve thickening without significant stenosis. Has outflow murmur raising suspicion for aortic stenosis, suspect mild by exam. Will update echo.   Sherren Mocha 08/28/2013 12:14 PM

## 2013-08-28 NOTE — Progress Notes (Signed)
Aorta duplex performed

## 2013-09-13 ENCOUNTER — Emergency Department (HOSPITAL_COMMUNITY)
Admission: EM | Admit: 2013-09-13 | Discharge: 2013-09-14 | Disposition: A | Discharge status: Home / Self Care | Payer: Medicare Other | Attending: Emergency Medicine | Admitting: Emergency Medicine

## 2013-09-13 ENCOUNTER — Encounter (HOSPITAL_COMMUNITY): Payer: Self-pay | Admitting: Emergency Medicine

## 2013-09-13 DIAGNOSIS — N289 Disorder of kidney and ureter, unspecified: Secondary | ICD-10-CM | POA: Insufficient documentation

## 2013-09-13 DIAGNOSIS — M542 Cervicalgia: Secondary | ICD-10-CM

## 2013-09-13 DIAGNOSIS — Z7982 Long term (current) use of aspirin: Secondary | ICD-10-CM | POA: Insufficient documentation

## 2013-09-13 DIAGNOSIS — Z951 Presence of aortocoronary bypass graft: Secondary | ICD-10-CM | POA: Insufficient documentation

## 2013-09-13 DIAGNOSIS — I2510 Atherosclerotic heart disease of native coronary artery without angina pectoris: Secondary | ICD-10-CM | POA: Insufficient documentation

## 2013-09-13 DIAGNOSIS — Z79899 Other long term (current) drug therapy: Secondary | ICD-10-CM | POA: Insufficient documentation

## 2013-09-13 DIAGNOSIS — E785 Hyperlipidemia, unspecified: Secondary | ICD-10-CM | POA: Insufficient documentation

## 2013-09-13 DIAGNOSIS — Z87891 Personal history of nicotine dependence: Secondary | ICD-10-CM | POA: Insufficient documentation

## 2013-09-13 DIAGNOSIS — M109 Gout, unspecified: Secondary | ICD-10-CM | POA: Insufficient documentation

## 2013-09-13 NOTE — ED Notes (Signed)
Patient arrives via EMS from home due to c/o neck and shoulder pain which is worse upon palpation and ROM No back pain, non-traumatic in origin

## 2013-09-14 ENCOUNTER — Telehealth: Payer: Self-pay | Admitting: Physician Assistant

## 2013-09-14 ENCOUNTER — Emergency Department (HOSPITAL_COMMUNITY): Payer: Medicare Other

## 2013-09-14 LAB — CBC
HCT: 33.5 % — ABNORMAL LOW (ref 39.0–52.0)
HEMOGLOBIN: 11 g/dL — AB (ref 13.0–17.0)
MCH: 29 pg (ref 26.0–34.0)
MCHC: 32.8 g/dL (ref 30.0–36.0)
MCV: 88.4 fL (ref 78.0–100.0)
Platelets: 129 10*3/uL — ABNORMAL LOW (ref 150–400)
RBC: 3.79 MIL/uL — ABNORMAL LOW (ref 4.22–5.81)
RDW: 13.9 % (ref 11.5–15.5)
WBC: 5.6 10*3/uL (ref 4.0–10.5)

## 2013-09-14 LAB — I-STAT CHEM 8, ED
BUN: 26 mg/dL — ABNORMAL HIGH (ref 6–23)
CALCIUM ION: 1.11 mmol/L — AB (ref 1.13–1.30)
Chloride: 104 mEq/L (ref 96–112)
Creatinine, Ser: 1.6 mg/dL — ABNORMAL HIGH (ref 0.50–1.35)
Glucose, Bld: 81 mg/dL (ref 70–99)
HEMATOCRIT: 38 % — AB (ref 39.0–52.0)
HEMOGLOBIN: 12.9 g/dL — AB (ref 13.0–17.0)
Potassium: 3.6 mEq/L — ABNORMAL LOW (ref 3.7–5.3)
Sodium: 142 mEq/L (ref 137–147)
TCO2: 25 mmol/L (ref 0–100)

## 2013-09-14 LAB — TROPONIN I: Troponin I: <0.3 ng/mL (ref <0.30)

## 2013-09-14 MED ORDER — FENTANYL CITRATE 0.05 MG/ML IJ SOLN
50.0000 ug | INTRAMUSCULAR | Status: DC | PRN
  Administered 2013-09-14 (×2): 50 ug via INTRAVENOUS
  Filled 2013-09-14 (×2): qty 2

## 2013-09-14 MED ORDER — HYDROMORPHONE HCL PF 1 MG/ML IJ SOLN
0.5000 mg | Freq: Once | INTRAMUSCULAR | Status: AC
  Administered 2013-09-14: 0.5 mg via INTRAVENOUS
  Filled 2013-09-14: qty 1

## 2013-09-14 MED ORDER — HYDROCODONE-ACETAMINOPHEN 5-325 MG PO TABS: 1.0000 | ORAL_TABLET | Freq: Four times a day (QID) | ORAL | Status: DC | PRN

## 2013-09-14 MED ORDER — ONDANSETRON HCL 4 MG/2ML IJ SOLN
4.0000 mg | Freq: Once | INTRAMUSCULAR | Status: AC
  Administered 2013-09-14: 4 mg via INTRAVENOUS
  Filled 2013-09-14: qty 2

## 2013-09-14 NOTE — ED Notes (Signed)
MD at bedside. 

## 2013-09-14 NOTE — ED Notes (Signed)
Patient asking for additional pain medication Will make Dr. Marnette Burgess aware

## 2013-09-14 NOTE — ED Provider Notes (Signed)
CSN: [id number]     Arrival date & time 09/13/13  2332 History   First MD Initiated Contact with Patient 09/14/13 0019     Chief Complaint  Patient presents with  . Shoulder Pain  . Neck Pain     (Consider location/radiation/quality/duration/timing/severity/associated sxs/prior Treatment) HPI  History provided by patient. Sitting down tonight at home got up and developed severe sharp right-sided neck pain worse with movement. Pain radiates to right shoulder. No associated weakness or numbness. Patient did have recent surgery left face for basal cell carcinoma. Since that time has been sleeping in a recliner and not laying on his left side. He attributes symptoms tonight to a "crick" in his neck. No chest pain. No shortness of breath. No trauma otherwise. No history of same.  Past Medical History  Diagnosis Date  . CAD (coronary artery disease)     S/P CABG and multiple PCI's  . Hyperlipidemia   . HTN (hypertension)   . AAA (abdominal aortic aneurysm)     5 cm on 2011 assessment  . Gout 2009  . Renal insufficiency   . Elevated PSA     Dr Rosana Hoes   Past Surgical History  Procedure Laterality Date  . Coronary artery bypass graft  1981   Family History  Problem Relation Age of Onset  . Hypertension Other    History  Substance Use Topics  . Smoking status: Former Research scientist (life sciences)  . Smokeless tobacco: Not on file  . Alcohol Use: No    Review of Systems  Constitutional: Negative for fever and chills.  Respiratory: Negative for shortness of breath.   Cardiovascular: Negative for chest pain.  Gastrointestinal: Negative for abdominal pain.  Genitourinary: Negative for dysuria.  Musculoskeletal: Positive for neck pain. Negative for back pain.  Skin: Negative for rash.  Neurological: Negative for weakness and numbness.  All other systems reviewed and are negative.      Allergies  Sulfa antibiotics  Home Medications   Current Outpatient Rx  Name  Route  Sig  Dispense  Refill   . aspirin 325 MG tablet   Oral   Take 162.5 mg by mouth daily.         . bimatoprost (LUMIGAN) 0.03 % ophthalmic drops   Both Eyes   Place 1 drop into both eyes at bedtime.           . celecoxib (CELEBREX) 200 MG capsule   Oral   Take 200 mg by mouth daily as needed for moderate pain.         . fish oil-omega-3 fatty acids 1000 MG capsule   Oral   Take 1 g by mouth daily.          . furosemide (LASIX) 80 MG tablet   Oral   Take 0.5 tablets (40 mg total) by mouth daily as needed for edema.   90 tablet   3   . nitroGLYCERIN (NITROSTAT) 0.4 MG SL tablet   Sublingual   Place 0.4 mg under the tongue as needed for chest pain. Chest pain         . PRESCRIPTION MEDICATION   Oral   Take 1 capsule by mouth 2 (two) times daily. antibiotic         . simvastatin (ZOCOR) 20 MG tablet   Oral   Take 20 mg by mouth daily.         Marland Kitchen telmisartan (MICARDIS) 40 MG tablet   Oral   Take 40 mg by mouth daily  as needed (hypertension).         . terazosin (HYTRIN) 5 MG capsule   Oral   Take 5 mg by mouth at bedtime.          BP 189/70  Pulse 64  Temp(Src) 97.6 F (36.4 C) (Oral)  Resp 16  SpO2 95% Physical Exam  Constitutional: He is oriented to person, place, and time. He appears well-developed and well-nourished.  HENT:  Head: Normocephalic and atraumatic.  Eyes: EOM are normal. Pupils are equal, round, and reactive to light.  Neck:  Tenderness right paracervical and somewhat midline. Decreased range of motion secondary to pain. No palpable muscle spasm. Equal grips, biceps, triceps strengths. Equal intact sensorium to light touch to both upper extremities.  Cardiovascular: Normal rate, regular rhythm and intact distal pulses.   Pulmonary/Chest: Effort normal and breath sounds normal. No respiratory distress.  Abdominal: Soft. He exhibits no distension. There is no tenderness.  Musculoskeletal: Normal range of motion. He exhibits no edema.  Neurological: He is  alert and oriented to person, place, and time.  Skin: Skin is warm and dry.    ED Course  Procedures (including critical care time) Labs Review Labs Reviewed  CBC  TROPONIN I  I-STAT CHEM 8, ED   Imaging Review Ct Cervical Spine Wo Contrast  09/14/2013   CLINICAL DATA:  Generalize neck pain radiating into the shoulder. No recent trauma.  EXAM: CT CERVICAL SPINE WITHOUT CONTRAST  TECHNIQUE: Multidetector CT imaging of the cervical spine was performed without intravenous contrast. Multiplanar CT image reconstructions were also generated.  COMPARISON:  None.  FINDINGS: No fractures identified involving the cervical spine. Large central disc extrusion at C3-4, with severe spinal stenosis and compression of the cord, the canal only measuring approximately 5 mm at the level of the disc extrusion. Erosion of the dens due to pannus, and there is a large amount of noncalcified pannus posterior to the dens, though the disc does not cause significant spinal stenosis. Sagittal reconstructed images demonstrate anatomic alignment, though disc space narrowing and endplate hypertrophic changes are present at every level from C3-4 through C6-7. Broad-based central and left paracentral disc extrusion is present at C6-7, resulting in mild spinal stenosis. Facet joints intact throughout with diffuse degenerative changes. Lateral masses intact throughout. Combination of facet and uncinate hypertrophy account for multilevel foraminal stenoses including mild right C2-3, severe bilateral C3-4, severe bilateral C4-5, severe bilateral C5-6, moderate right and severe left C6-7, mild bilateral C7-T1.  IMPRESSION: 1. Large central disc extrusion at C3-4 causing severe spinal stenosis and likely marked cord compression. 2. Broad-based central and left paracentral disc extrusion at C6-7 resulting in mild spinal stenosis. 3. Diffuse degenerative disc disease, spondylosis, and facet degenerative changes with multilevel foraminal  stenoses as detailed above. 4. Noncalcified pannus posterior to the dens. The dens is eroded by the pannus. A nonemergent though urgent outpatient MRI of the cervical spine without contrast is recommended in further evaluation, as the patient is at risk for cord contusion given the severe spinal stenosis at C3-4.   Electronically Signed   By: Evangeline Dakin M.D.   On: 09/14/2013 02:32     Date: 09/14/2013  Rate: 64  Rhythm: normal sinus rhythm  QRS Axis: normal  Intervals: normal  ST/T Wave abnormalities: nonspecific ST changes  Conduction Disutrbances:none  Narrative Interpretation:   Old EKG Reviewed: no sig changes from previous  IV Dilaudid. IV fentanyl  3:30 AM d/w NSG Dr Ronnald Ramp, reviewed CT images and recommends  no need for emergent intervention.   On recheck, pain significantly improved, is requesting some more pain medication and then will be comfortable to go home.  Plan outpatient follow up primary care physician, MRI and can followup neurosurgery as needed. Pain control. Patient and family bedside agree to return precautions.   MDM   Diagnosis: Neck pain  CT obtained and reviewed as above - results discussed with and reviewed by neurosurgery. No indication for immediate neurosurgical intervention without deficits at this time Pain control IV narcotics Labs reviewed as above. Screening EKG. Vital signs and nursing notes reviewed and considered.   Teressa Lower, MD 09/14/13 [phone]

## 2013-09-14 NOTE — Telephone Encounter (Signed)
Pt daughter called Sunday answering service regarding the two imaging studies he is due to have tomorrow - CT angio abd/pelvis as well as 2D echocardiogram. The patient went to the ER last night for back/neck discomfort and was diagnosed with 2 bulging discs. Review of CT c-spine reflects severe spine changes including severe spinal stenosis and possible cord compression. EDP had discussed with neurosurgery on call who felt there was no indication for immediate neurosurgical intervention without deficits at this time and there are plans for him to f/u with regular doctor to pursue possible MRI. He is still having some discomfort today and his daughter is not sure he will be fit to come up to [facility] tomorrow for these tests. Tests are scheduled to begin at 17 per daughter. I think it's reasonable to postpone (ordered on 3/5 so not ordered on emergent basis) - since I do not have control over scheduling on weekend, I advised her to please call the office tomorrow at 8am when they open to discuss rescheduling. She verbalized understanding and gratitude. Linette Gunderson PA-C

## 2013-09-14 NOTE — ED Notes (Signed)
Patient transported to CT 

## 2013-09-14 NOTE — Discharge Instructions (Signed)
Your CT scan shows cervical disc extrusion as discussed.  Take pain medications as prescribed. Follow up with your doctor for further evaluation and to consider MRI. Be evaluated immediately if you develop any weakness or numbness in your arms or legs or any worsening condition.   Cervical Sprain  A cervical sprain is an injury in the neck in which the strong, fibrous tissues (ligaments) that connect your neck bones stretch or tear. Cervical sprains can range from mild to severe. Severe cervical sprains can cause the neck vertebrae to be unstable. This can lead to damage of the spinal cord and can result in serious nervous system problems. The amount of time it takes for a cervical sprain to get better depends on the cause and extent of the injury. Most cervical sprains heal in 1 to 3 weeks.  CAUSES  Severe cervical sprains may be caused by:  Contact sport injuries (such as from football, rugby, wrestling, hockey, auto racing, gymnastics, diving, martial arts, or boxing).  Motor vehicle collisions.  Whiplash injuries. This is an injury from a sudden forward-and backward whipping movement of the head and neck.  Falls.  Mild cervical sprains may be caused by:  Being in an awkward position, such as while cradling a telephone between your ear and shoulder.  Sitting in a chair that does not offer proper support.  Working at a poorly Landscape architect station.  Looking up or down for long periods of time.  SYMPTOMS  Pain, soreness, stiffness, or a burning sensation in the front, back, or sides of the neck. This discomfort may develop immediately after the injury or slowly, 24 hours or more after the injury.  Pain or tenderness directly in the middle of the back of the neck.  Shoulder or upper back pain.  Limited ability to move the neck.  Headache.  Dizziness.  Weakness, numbness, or tingling in the hands or arms.  Muscle spasms.  Difficulty swallowing or chewing.  Tenderness and swelling of  the neck.  DIAGNOSIS  Most of the time your health care provider can diagnose a cervical sprain by taking your history and doing a physical exam. Your health care provider will ask about previous neck injuries and any known neck problems, such as arthritis in the neck. X-rays may be taken to find out if there are any other problems, such as with the bones of the neck. Other tests, such as a CT scan or MRI, may also be needed.  TREATMENT  Treatment depends on the severity of the cervical sprain. Mild sprains can be treated with rest, keeping the neck in place (immobilization), and pain medicines. Severe cervical sprains are immediately immobilized. Further treatment is done to help with pain, muscle spasms, and other symptoms and may include:  Medicines, such as pain relievers, numbing medicines, or muscle relaxants.  Physical therapy. This may involve stretching exercises, strengthening exercises, and posture training. Exercises and improved posture can help stabilize the neck, strengthen muscles, and help stop symptoms from returning.  HOME CARE INSTRUCTIONS  Put ice on the injured area.  Put ice in a plastic bag.  Place a towel between your skin and the bag.  Leave the ice on for 15 20 minutes, 3 4 times a day.  If your injury was severe, you may have been given a cervical collar to wear. A cervical collar is a two-piece collar designed to keep your neck from moving while it heals.  Do not remove the collar unless instructed by your  health care provider.  If you have long hair, keep it outside of the collar.  Ask your health care provider before making any adjustments to your collar. Minor adjustments may be required over time to improve comfort and reduce pressure on your chin or on the back of your head.  If you are allowed to remove the collar for cleaning or bathing, follow your health care provider's instructions on how to do so safely.  Keep your collar clean by wiping it with mild soap and  water and drying it completely. If the collar you have been given includes removable pads, remove them every 1 2 days and hand wash them with soap and water. Allow them to air dry. They should be completely dry before you wear them in the collar.  If you are allowed to remove the collar for cleaning and bathing, wash and dry the skin of your neck. Check your skin for irritation or sores. If you see any, tell your health care provider.  Do not drive while wearing the collar.  Only take over-the-counter or prescription medicines for pain, discomfort, or fever as directed by your health care provider.  Keep all follow-up appointments as directed by your health care provider.  Keep all physical therapy appointments as directed by your health care provider.  Make any needed adjustments to your workstation to promote good posture.  Avoid positions and activities that make your symptoms worse.  Warm up and stretch before being active to help prevent problems.  SEEK MEDICAL CARE IF:  Your pain is not controlled with medicine.  You are unable to decrease your pain medicine over time as planned.  Your activity level is not improving as expected.  SEEK IMMEDIATE MEDICAL CARE IF:  You develop any bleeding.  You develop stomach upset.  You have signs of an allergic reaction to your medicine.  Your symptoms get worse.  You develop new, unexplained symptoms.  You have numbness, tingling, weakness, or paralysis in any part of your body.  MAKE SURE YOU:  Understand these instructions.  Will watch your condition.  Will get help right away if you are not doing well or get worse. Document Released: 04/09/2007 Document Revised: 04/02/2013 Document Reviewed: 12/18/2012  [facility] Patient Information 2014 [place].

## 2013-09-14 NOTE — ED Notes (Signed)
Family at bedside. 

## 2013-09-15 ENCOUNTER — Ambulatory Visit (INDEPENDENT_AMBULATORY_CARE_PROVIDER_SITE_OTHER)
Admission: RE | Admit: 2013-09-15 | Discharge: 2013-09-15 | Disposition: A | Discharge status: Home / Self Care | Payer: Medicare Other | Source: Ambulatory Visit | Attending: Cardiovascular Disease | Admitting: Cardiovascular Disease

## 2013-09-15 ENCOUNTER — Telehealth: Payer: Self-pay | Admitting: Cardiovascular Disease

## 2013-09-15 ENCOUNTER — Ambulatory Visit (HOSPITAL_COMMUNITY): Payer: Medicare Other | Attending: Cardiology | Admitting: Radiology

## 2013-09-15 DIAGNOSIS — R011 Cardiac murmur, unspecified: Secondary | ICD-10-CM

## 2013-09-15 DIAGNOSIS — I714 Abdominal aortic aneurysm, without rupture, unspecified: Secondary | ICD-10-CM

## 2013-09-15 MED ORDER — IOHEXOL 350 MG/ML SOLN
70.0000 mL | Freq: Once | INTRAVENOUS | Status: AC | PRN
  Administered 2013-09-15: 70 mL via INTRAVENOUS

## 2013-09-15 NOTE — Telephone Encounter (Signed)
Pt's wife called to asked if pt can come for CT body scan today without drinking the water. Wife is aware that pt was not scheduled to drink water prior this test, per Lattie Haw in [place]. Pt is aware that pt can come to have the echo and CT body scan as scheduled. Pt's wife verbalized understanding.

## 2013-09-15 NOTE — Telephone Encounter (Signed)
New problem    Pt need to speak to nurse concerning canceling his test that is sched for 11:00 today. Please call pt before he cancel.

## 2013-09-15 NOTE — Progress Notes (Signed)
Echocardiogram performed.  

## 2013-09-16 ENCOUNTER — Ambulatory Visit: Payer: Medicare Other | Admitting: Internal Medicine

## 2013-09-22 ENCOUNTER — Encounter (HOSPITAL_COMMUNITY)
Admission: RE | Admit: 2013-09-22 | Discharge: 2013-09-22 | Disposition: A | Discharge status: Home / Self Care | Payer: Medicare Other | Source: Ambulatory Visit | Attending: Nephrology | Admitting: Nephrology

## 2013-09-22 LAB — POCT HEMOGLOBIN-HEMACUE: Hemoglobin: 11.3 g/dL — ABNORMAL LOW (ref 13.0–17.0)

## 2013-09-22 MED ORDER — EPOETIN ALFA 20000 UNIT/ML IJ SOLN
INTRAMUSCULAR | Status: AC
  Filled 2013-09-22: qty 1

## 2013-09-22 MED ORDER — EPOETIN ALFA 20000 UNIT/ML IJ SOLN
20000.0000 [IU] | INTRAMUSCULAR | Status: DC
  Administered 2013-09-22: 20000 [IU] via SUBCUTANEOUS

## 2013-09-24 ENCOUNTER — Encounter: Payer: Self-pay | Admitting: Vascular Surgery

## 2013-09-25 ENCOUNTER — Encounter: Payer: Self-pay | Admitting: Vascular Surgery

## 2013-09-25 ENCOUNTER — Ambulatory Visit (HOSPITAL_COMMUNITY)
Admission: RE | Admit: 2013-09-25 | Discharge: 2013-09-25 | Disposition: A | Discharge status: Home / Self Care | Payer: Medicare Other | Source: Ambulatory Visit | Attending: Vascular Surgery | Admitting: Vascular Surgery

## 2013-09-25 ENCOUNTER — Other Ambulatory Visit: Payer: Self-pay | Admitting: Vascular Surgery

## 2013-09-25 ENCOUNTER — Ambulatory Visit (INDEPENDENT_AMBULATORY_CARE_PROVIDER_SITE_OTHER): Payer: Medicare Other | Admitting: Vascular Surgery

## 2013-09-25 VITALS — BP 143/64 | HR 51 | Ht 70.0 in | Wt 162.1 lb

## 2013-09-25 DIAGNOSIS — I714 Abdominal aortic aneurysm, without rupture, unspecified: Secondary | ICD-10-CM

## 2013-09-25 NOTE — Progress Notes (Addendum)
[facility] [place] [facility] HISTORY AND PHYSICAL   History of Present Illness:  Patient is a 80 y.o. year old male who presents for evaluation of abdominal aortic aneurysm. The aneurysm has been followed with serial ultrasound for several years. He was recently noted a 5.6 cm in diameter. He denies any abdominal or back pain. He has had no prior abdominal surgery. He has had a left inguinal hernia repair. He overall is very active..  Other medical problems include coronary artery disease followed by Dr. Sherren Mocha, Hyperlipidemia, hypertension, renal insufficiency (Cr 1.5-1.8 over last 2 years)all of which are currently stable.  Past Medical History  Diagnosis Date  . CAD (coronary artery disease)     S/P CABG and multiple PCI's  . Hyperlipidemia   . HTN (hypertension)   . AAA (abdominal aortic aneurysm)     5 cm on 2011 assessment  . Gout 2009  . Renal insufficiency   . Elevated PSA     Dr Rosana Hoes  . Anemia   . Myocardial infarction     Past Surgical History  Procedure Laterality Date  . Coronary artery bypass graft  1981  . Hernia repair    . Inner ear surgery      Social History History  Substance Use Topics  . Smoking status: Former Research scientist (life sciences)  . Smokeless tobacco: Not on file  . Alcohol Use: No    Family History Family History  Problem Relation Age of Onset  . Hypertension Other   . Heart disease Father     before age 55  . Heart attack Father     Allergies  Allergies  Allergen Reactions  . Sulfa Antibiotics Rash     Current Outpatient Prescriptions  Medication Sig Dispense Refill  . aspirin 325 MG tablet Take 162.5 mg by mouth daily.      . bimatoprost (LUMIGAN) 0.03 % ophthalmic drops Place 1 drop into both eyes at bedtime.        . celecoxib (CELEBREX) 200 MG capsule Take 200 mg by mouth daily as needed for moderate pain.      . fish oil-omega-3 fatty acids 1000 MG capsule Take 1 g by mouth daily.       . furosemide (LASIX) 80 MG tablet  Take 0.5 tablets (40 mg total) by mouth daily as needed for edema.  90 tablet  3  . HYDROcodone-acetaminophen (NORCO/VICODIN) 5-325 MG per tablet Take 1 tablet by mouth every 6 (six) hours as needed.  15 tablet  0  . nitroGLYCERIN (NITROSTAT) 0.4 MG SL tablet Place 0.4 mg under the tongue as needed for chest pain. Chest pain      . PRESCRIPTION MEDICATION Take 1 capsule by mouth 2 (two) times daily. antibiotic      . simvastatin (ZOCOR) 20 MG tablet Take 20 mg by mouth daily.      Marland Kitchen telmisartan (MICARDIS) 40 MG tablet Take 40 mg by mouth daily as needed (hypertension).      . terazosin (HYTRIN) 5 MG capsule Take 5 mg by mouth at bedtime.       No current facility-administered medications for this visit.    ROS:   General:  No weight loss, Fever, chills  HEENT: No recent headaches, no nasal bleeding, no visual changes, no sore throat  Neurologic: No dizziness, blackouts, seizures. No recent symptoms of stroke or mini- stroke. No recent episodes of slurred speech, or temporary blindness.  Cardiac: No recent episodes of chest pain/pressure, no shortness of  breath at rest.  No shortness of breath with exertion.  Denies history of atrial fibrillation or irregular heartbeat  Vascular: No history of rest pain in feet.  No history of claudication.  No history of non-healing ulcer, No history of DVT   Pulmonary: No home oxygen, no productive cough, no hemoptysis,  No asthma or wheezing  Musculoskeletal:  [ ]  Arthritis, [ ]  Low back pain,  [ ]  Joint pain  Hematologic:No history of hypercoagulable state.  No history of easy bleeding.  No history of anemia  Gastrointestinal: No hematochezia or melena,  No gastroesophageal reflux, no trouble swallowing  Urinary: [ x] chronic Kidney disease, [ ]  on HD - [ ]  MWF or [ ]  TTHS, [ ]  Burning with urination, [ ]  Frequent urination, [ ]  Difficulty urinating;   Skin: No rashes  Psychological: No history of anxiety,  No history of depression   Physical  Examination  Filed Vitals:   09/25/13 1011  BP: 143/64  Pulse: 51  Height: 5\' 10"  (1.778 m)  Weight: 162 lb 1.6 oz (73.528 kg)  SpO2: 100%    Body mass index is 23.26 kg/(m^2).  General:  Alert and oriented, no acute distress HEENT: Normal Neck: No bruit or JVD Pulmonary: Clear to auscultation bilaterally Cardiac: Regular Rate and Rhythm 2/6 systolic murmur  Abdomen: Soft, non-tender, non-distended, easily palpable epigastric pulsatile mass Skin: No rash Extremity Pulses:  2+ radial, brachial, femoral, absent right dorsalis pedis, posterior tibial pulses 2+ left posterior tibial pulse absent dorsalis pedis pulse,  the patient had very full 3+ popliteal pulses Musculoskeletal: No deformity or edema  Neurologic: Upper and lower extremity motor 5/5 and symmetric  DATA:  CT angiogram the abdomen and pelvis is reviewed. This is dated March 24. This shows a 5.6 cm infrarenal abdominal aortic aneurysm. No evidence of rupture. Infrarenal neck measures 23 mm diameter some tortuosity of the iliacs and infrarenal neck. 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  Popliteal ultrasound was also obtained they do to the full pulses bilaterally. 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  PLAN:  Most likely the patient should be a candidate for endovascular repair of the internal iliac aneurysm as well as infrarenal aneurysm. This would require coil embolization of the right internal iliac artery with extension into the external iliac artery.  Discussion with family health but about risks benefits possible complications and procedure details of endovascular aneurysm repair. We will also have to check his renal function preoperatively. He is scheduled to have the aneurysm repair on April 15. If we are unable to fix his aneurysm via an endovascular  approach we may wait until he reaches 6 cm due to his age before considering open repair.  However if his renal function is off on that morning we may have to stage and this in 2 pieces having the coil embolization first followed by the infrarenal AAA repair at a later date.  Ruta Hinds, MD [facility] [place] Office: [phone] Pager: [phone]

## 2013-09-30 ENCOUNTER — Other Ambulatory Visit: Payer: Self-pay | Admitting: *Deleted

## 2013-10-02 ENCOUNTER — Encounter (HOSPITAL_COMMUNITY): Payer: Self-pay | Admitting: Pharmacy Technician

## 2013-10-03 ENCOUNTER — Encounter (HOSPITAL_COMMUNITY): Payer: Self-pay

## 2013-10-03 ENCOUNTER — Inpatient Hospital Stay (HOSPITAL_COMMUNITY)
Admission: RE | Admit: 2013-10-03 | Discharge: 2013-10-03 | Disposition: A | Discharge status: Home / Self Care | Payer: Medicare Other | Source: Ambulatory Visit

## 2013-10-03 NOTE — Pre-Procedure Instructions (Signed)
Robert Ritter  10/03/2013   Your procedure is scheduled on:  Wednesday, October 08, 2013 at 8:30 AM   Report to [facility] Short Stay (use Main Entrance "A'') at 6:30 AM.   Call this number if you have problems the morning of surgery: [phone]   Remember:   Do not eat food or drink liquids after midnight Tuesday, October 07, 2013    Take these medicines the morning of surgery with A SIP OF WATER: aspirin 325 MG tablet,  If needed:HYDROcodone-acetaminophen (NORCO/VICODIN) for pain, nitroGLYCERIN (NITROSTAT) for chest pain    Do not wear jewelry,   Do not wear lotions, powders, or perfumes. You may NOT wear deodorant.  Do not shave 48 hours prior to surgery. Men may shave face and neck.  Do not bring valuables to the hospital.  [facility] is not responsible for any belongings or valuables.               Contacts, dentures or bridgework may not be worn into surgery.   Leave suitcase in the car. After surgery it may be brought to your room.  For patients admitted to the hospital, discharge time is determined by your treatment team.      Special Instructions:  Special Instructions:Special Instructions: [facility] - Preparing for Surgery  Before surgery, you can play an important role.  Because skin is not sterile, your skin needs to be as free of germs as possible.  You can reduce the number of germs on you skin by washing with CHG (chlorahexidine gluconate) soap before surgery.  CHG is an antiseptic cleaner which kills germs and bonds with the skin to continue killing germs even after washing.  Please DO NOT use if you have an allergy to CHG or antibacterial soaps.  If your skin becomes reddened/irritated stop using the CHG and inform your nurse when you arrive at Short Stay.  Do not shave (including legs and underarms) for at least 48 hours prior to the first CHG shower.  You may shave your face.  Please follow these instructions carefully:   1.  Shower with CHG Soap the  night before surgery and the morning of Surgery.  2.  If you choose to wash your hair, wash your hair first as usual with your normal shampoo.  3.  After you shampoo, rinse your hair and body thoroughly to remove the Shampoo.  4.  Use CHG as you would any other liquid soap.  You can apply chg directly  to the skin and wash gently with scrungie or a clean washcloth.  5.  Apply the CHG Soap to your body ONLY FROM THE NECK DOWN.  Do not use on open wounds or open sores.  Avoid contact with your eyes, ears, mouth and genitals (private parts).  Wash genitals (private parts) with your normal soap.  6.  Wash thoroughly, paying special attention to the area where your surgery will be performed.  7.  Thoroughly rinse your body with warm water from the neck down.  8.  DO NOT shower/wash with your normal soap after using and rinsing off the CHG Soap.  9.  Pat yourself dry with a clean towel.            10.  Wear clean pajamas.            11.  Place clean sheets on your bed the night of your first shower and do not sleep with pets.  Day of Surgery  Do  not apply any lotions/deodorants the morning of surgery.  Please wear clean clothes to the hospital/surgery center.   Please read over the following fact sheets that you were given: Pain Booklet, Coughing and Deep Breathing, Blood Transfusion Information, MRSA Information and Surgical Site Infection Prevention

## 2013-10-07 ENCOUNTER — Ambulatory Visit (HOSPITAL_COMMUNITY)
Admission: RE | Admit: 2013-10-07 | Discharge: 2013-10-07 | Disposition: A | Discharge status: Home / Self Care | Payer: Medicare Other | Source: Ambulatory Visit | Attending: Vascular Surgery | Admitting: Vascular Surgery

## 2013-10-07 ENCOUNTER — Encounter (HOSPITAL_COMMUNITY): Payer: Self-pay

## 2013-10-07 ENCOUNTER — Encounter (HOSPITAL_COMMUNITY)
Admission: RE | Admit: 2013-10-07 | Discharge: 2013-10-07 | Disposition: A | Discharge status: Home / Self Care | Payer: Medicare Other | Source: Ambulatory Visit | Attending: Vascular Surgery | Admitting: Vascular Surgery

## 2013-10-07 DIAGNOSIS — I252 Old myocardial infarction: Secondary | ICD-10-CM | POA: Insufficient documentation

## 2013-10-07 DIAGNOSIS — Z951 Presence of aortocoronary bypass graft: Secondary | ICD-10-CM

## 2013-10-07 DIAGNOSIS — I10 Essential (primary) hypertension: Secondary | ICD-10-CM

## 2013-10-07 DIAGNOSIS — Z01818 Encounter for other preprocedural examination: Secondary | ICD-10-CM

## 2013-10-07 DIAGNOSIS — I714 Abdominal aortic aneurysm, without rupture, unspecified: Secondary | ICD-10-CM | POA: Insufficient documentation

## 2013-10-07 DIAGNOSIS — I2510 Atherosclerotic heart disease of native coronary artery without angina pectoris: Secondary | ICD-10-CM

## 2013-10-07 DIAGNOSIS — Z87891 Personal history of nicotine dependence: Secondary | ICD-10-CM

## 2013-10-07 HISTORY — DX: Central retinal vein occlusion, left eye, stable: H34.8122

## 2013-10-07 HISTORY — DX: Unspecified glaucoma: H40.9

## 2013-10-07 HISTORY — DX: Unspecified malignant neoplasm of skin of unspecified part of face: C44.300

## 2013-10-07 LAB — CBC
HEMATOCRIT: 38.7 % — AB (ref 39.0–52.0)
Hemoglobin: 12.4 g/dL — ABNORMAL LOW (ref 13.0–17.0)
MCH: 28.4 pg (ref 26.0–34.0)
MCHC: 32 g/dL (ref 30.0–36.0)
MCV: 88.6 fL (ref 78.0–100.0)
Platelets: 128 10*3/uL — ABNORMAL LOW (ref 150–400)
RBC: 4.37 MIL/uL (ref 4.22–5.81)
RDW: 14.2 % (ref 11.5–15.5)
WBC: 5.9 10*3/uL (ref 4.0–10.5)

## 2013-10-07 LAB — BLOOD GAS, ARTERIAL
Acid-Base Excess: 2.4 mmol/L — ABNORMAL HIGH (ref 0.0–2.0)
BICARBONATE: 26.4 meq/L — AB (ref 20.0–24.0)
Drawn by: 181601
FIO2: 0.21 %
O2 SAT: 99.4 %
PO2 ART: 103 mmHg — AB (ref 80.0–100.0)
Patient temperature: 98.6
TCO2: 27.6 mmol/L (ref 0–100)
pCO2 arterial: 40.3 mmHg (ref 35.0–45.0)
pH, Arterial: 7.431 (ref 7.350–7.450)

## 2013-10-07 LAB — COMPREHENSIVE METABOLIC PANEL
ALT: 11 U/L (ref 0–53)
AST: 18 U/L (ref 0–37)
Albumin: 4 g/dL (ref 3.5–5.2)
Alkaline Phosphatase: 60 U/L (ref 39–117)
BILIRUBIN TOTAL: 0.6 mg/dL (ref 0.3–1.2)
BUN: 24 mg/dL — ABNORMAL HIGH (ref 6–23)
CO2: 23 meq/L (ref 19–32)
Calcium: 9 mg/dL (ref 8.4–10.5)
Chloride: 102 mEq/L (ref 96–112)
Creatinine, Ser: 1.36 mg/dL — ABNORMAL HIGH (ref 0.50–1.35)
GFR calc Af Amer: 51 mL/min — ABNORMAL LOW (ref >90)
GFR calc non Af Amer: 44 mL/min — ABNORMAL LOW (ref >90)
Glucose, Bld: 100 mg/dL — ABNORMAL HIGH (ref 70–99)
Potassium: 4.1 mEq/L (ref 3.7–5.3)
SODIUM: 141 meq/L (ref 137–147)
Total Protein: 7.2 g/dL (ref 6.0–8.3)

## 2013-10-07 LAB — TYPE AND SCREEN
ABO/RH(D): A POS
ANTIBODY SCREEN: NEGATIVE

## 2013-10-07 LAB — URINALYSIS, ROUTINE W REFLEX MICROSCOPIC
Bilirubin Urine: NEGATIVE
Glucose, UA: NEGATIVE mg/dL
HGB URINE DIPSTICK: NEGATIVE
Ketones, ur: NEGATIVE mg/dL
Leukocytes, UA: NEGATIVE
Nitrite: NEGATIVE
PH: 6.5 (ref 5.0–8.0)
Protein, ur: 100 mg/dL — AB
SPECIFIC GRAVITY, URINE: 1.012 (ref 1.005–1.030)
UROBILINOGEN UA: 0.2 mg/dL (ref 0.0–1.0)

## 2013-10-07 LAB — URINE MICROSCOPIC-ADD ON

## 2013-10-07 LAB — PROTIME-INR
INR: 1 (ref 0.00–1.49)
Prothrombin Time: 13 seconds (ref 11.6–15.2)

## 2013-10-07 LAB — SURGICAL PCR SCREEN
MRSA, PCR: NEGATIVE
STAPHYLOCOCCUS AUREUS: NEGATIVE

## 2013-10-07 LAB — ABO/RH: ABO/RH(D): A POS

## 2013-10-07 LAB — APTT: aPTT: 28 seconds (ref 24–37)

## 2013-10-07 MED ORDER — CEFUROXIME SODIUM 1.5 G IJ SOLR
1.5000 g | INTRAMUSCULAR | Status: AC
  Administered 2013-10-08: 1.5 g via INTRAVENOUS
  Filled 2013-10-07: qty 1.5

## 2013-10-07 MED ORDER — CHLORHEXIDINE GLUCONATE 4 % EX LIQD
60.0000 mL | Freq: Once | CUTANEOUS | Status: DC
  Filled 2013-10-07: qty 60

## 2013-10-07 MED ORDER — SODIUM CHLORIDE 0.9 % IV SOLN: INTRAVENOUS | Status: DC

## 2013-10-07 NOTE — Progress Notes (Signed)
Anesthesia Chart Review:  Patient is a 80 year old male scheduled for AAA EVAR and coil embolization ([employer]) - right internal iliac aneurysm tomorrow by Dr. Oneida Alar.  History includes former smoker, CAD s/p CABG '81 s/p DES to SVG-LAD, SVG-OM1, SVG-OM2 grafts 04/19/04, HLD, HTN, AAA, gout, anemia, glaucoma, central retinal vein occlusion left eye, CKD, skin cancer, left IHR.  Cardiologist is Dr. Burt Knack who felt it was reasonable to consider AAA repair as long as it could be done via EVAR.  PCP is Dr. Alain Marion.  Echo on 09/15/13 showed: - Left ventricle: The cavity size was normal. There was mild concentric hypertrophy. Systolic function was vigorous. The estimated ejection fraction was in the range of 65% to 70%. Wall motion was normal; there were no regional wall motion abnormalities. Doppler parameters are consistent with abnormal left ventricular relaxation (grade 1 diastolic dysfunction). Doppler parameters are consistent with both elevated ventricular end-diastolic filling pressure and elevated left atrial filling pressure. - Aortic valve: Trileaflet; mildly thickened, mildly calcified leaflets. Transvalvular velocity was within the normal range. There was no stenosis. Moderate regurgitation. - Mitral valve: Calcified annulus predominantly posterior and involving posterior mitral valve leaflet. Transvalvular velocity was within the normal range. There was no evidence for stenosis. Mild regurgitation. - Left atrium: The atrium was moderately dilated. - Right ventricle: Systolic function was normal. - Pulmonic valve: Trivial regurgitation. - Pulmonary arteries: Systolic pressure was within the normal range. - Inferior vena cava: The vessel was normal in size; the respirophasic diameter changes were in the normal range (= 50%); findings are consistent with normal central venous pressure.  EKG on 09/14/13 showed sinus rhythm, borderline low voltage extremity leads, anterior septal infarct (old),  minimal ST depression, lateral leads.   CXR on 10/07/13 showed: COPD changes with minimal chronic atelectasis or scarring at medial right lower lobe. Post CABG. No acute abnormalities.   CT scan of the abd/pelvix on 09/15/13 showed: 1. Saccular infrarenal abdominal aortic aneurysm measuring approximately 5.6 cm.  2. 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  3. 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  4. 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  5. Aneurysmal dilatation of the bilateral common iliac arteries, the left measuring 2.3 cm, the right measuring 1.9 cm.  6. Approximately 3.1 cm right internal iliac artery.  7. Suspected aneurysmal dilatation of the ascending thoracic aorta with aortic through measuring at least 48 mm in diameter. Further evaluation could be performed with dedicated chest CT a as clinically indicated.  8. Post median sternotomy with calcifications within the native coronary arteries.  9. Marked nodular enlargement of the prostate with mass effect upon the undersurface of the bladder. Correlation with digital rectal prostate examination and symptoms of urinary retention is recommended.  Preoperative labs noted.   If no acute changes then I anticipate that he can proceed as planned.  George Hugh [facility] Short Stay Center/Anesthesiology Phone [phone] 10/07/2013 12:47 PM

## 2013-10-07 NOTE — Progress Notes (Addendum)
Pt sees Dr. Burt Knack and LOV 1 mth ago. Denies any cardiac issues at present.

## 2013-10-08 ENCOUNTER — Encounter (HOSPITAL_COMMUNITY)
Admission: RE | Disposition: A | Discharge status: Skilled Nursing Facility | Payer: Self-pay | Source: Ambulatory Visit | Attending: Vascular Surgery

## 2013-10-08 ENCOUNTER — Inpatient Hospital Stay (HOSPITAL_COMMUNITY)
Admission: RE | Admit: 2013-10-08 | Discharge: 2013-10-14 | DRG: 238 | Disposition: A | Discharge status: Skilled Nursing Facility | Payer: Medicare Other | Source: Ambulatory Visit | Attending: Vascular Surgery | Admitting: Vascular Surgery

## 2013-10-08 ENCOUNTER — Encounter (HOSPITAL_COMMUNITY): Payer: Self-pay | Admitting: Anesthesiology

## 2013-10-08 ENCOUNTER — Encounter (HOSPITAL_COMMUNITY): Payer: Medicare Other | Admitting: Vascular Surgery

## 2013-10-08 ENCOUNTER — Inpatient Hospital Stay (HOSPITAL_COMMUNITY): Payer: Medicare Other | Admitting: Anesthesiology

## 2013-10-08 ENCOUNTER — Inpatient Hospital Stay (HOSPITAL_COMMUNITY): Payer: Medicare Other

## 2013-10-08 DIAGNOSIS — Z951 Presence of aortocoronary bypass graft: Secondary | ICD-10-CM

## 2013-10-08 DIAGNOSIS — Z8249 Family history of ischemic heart disease and other diseases of the circulatory system: Secondary | ICD-10-CM

## 2013-10-08 DIAGNOSIS — N289 Disorder of kidney and ureter, unspecified: Secondary | ICD-10-CM | POA: Diagnosis present

## 2013-10-08 DIAGNOSIS — R339 Retention of urine, unspecified: Secondary | ICD-10-CM | POA: Diagnosis not present

## 2013-10-08 DIAGNOSIS — K5900 Constipation, unspecified: Secondary | ICD-10-CM | POA: Diagnosis not present

## 2013-10-08 DIAGNOSIS — I723 Aneurysm of iliac artery: Secondary | ICD-10-CM | POA: Diagnosis present

## 2013-10-08 DIAGNOSIS — I10 Essential (primary) hypertension: Secondary | ICD-10-CM | POA: Diagnosis present

## 2013-10-08 DIAGNOSIS — E785 Hyperlipidemia, unspecified: Secondary | ICD-10-CM | POA: Diagnosis present

## 2013-10-08 DIAGNOSIS — I714 Abdominal aortic aneurysm, without rupture, unspecified: Principal | ICD-10-CM | POA: Diagnosis present

## 2013-10-08 DIAGNOSIS — Z7982 Long term (current) use of aspirin: Secondary | ICD-10-CM

## 2013-10-08 DIAGNOSIS — M109 Gout, unspecified: Secondary | ICD-10-CM | POA: Diagnosis present

## 2013-10-08 DIAGNOSIS — R972 Elevated prostate specific antigen [PSA]: Secondary | ICD-10-CM | POA: Diagnosis present

## 2013-10-08 DIAGNOSIS — I2510 Atherosclerotic heart disease of native coronary artery without angina pectoris: Secondary | ICD-10-CM | POA: Diagnosis present

## 2013-10-08 DIAGNOSIS — I252 Old myocardial infarction: Secondary | ICD-10-CM

## 2013-10-08 DIAGNOSIS — Z87891 Personal history of nicotine dependence: Secondary | ICD-10-CM

## 2013-10-08 HISTORY — PX: ABDOMINAL AORTIC ENDOVASCULAR STENT GRAFT: SHX5707

## 2013-10-08 HISTORY — PX: EMBOLIZATION: SHX5507

## 2013-10-08 LAB — BASIC METABOLIC PANEL
BUN: 27 mg/dL — ABNORMAL HIGH (ref 6–23)
CALCIUM: 8.2 mg/dL — AB (ref 8.4–10.5)
CO2: 24 mEq/L (ref 19–32)
Chloride: 106 mEq/L (ref 96–112)
Creatinine, Ser: 1.44 mg/dL — ABNORMAL HIGH (ref 0.50–1.35)
GFR calc Af Amer: 47 mL/min — ABNORMAL LOW (ref >90)
GFR, EST NON AFRICAN AMERICAN: 41 mL/min — AB (ref >90)
Glucose, Bld: 92 mg/dL (ref 70–99)
POTASSIUM: 4 meq/L (ref 3.7–5.3)
Sodium: 142 mEq/L (ref 137–147)

## 2013-10-08 LAB — CBC
HCT: 31.4 % — ABNORMAL LOW (ref 39.0–52.0)
HCT: 32.6 % — ABNORMAL LOW (ref 39.0–52.0)
HEMOGLOBIN: 10.4 g/dL — AB (ref 13.0–17.0)
Hemoglobin: 10.4 g/dL — ABNORMAL LOW (ref 13.0–17.0)
MCH: 29.2 pg (ref 26.0–34.0)
MCH: 28.4 pg (ref 26.0–34.0)
MCHC: 33.1 g/dL (ref 30.0–36.0)
MCHC: 31.9 g/dL (ref 30.0–36.0)
MCV: 88.2 fL (ref 78.0–100.0)
MCV: 89.1 fL (ref 78.0–100.0)
PLATELETS: 94 10*3/uL — AB (ref 150–400)
Platelets: 101 10*3/uL — ABNORMAL LOW (ref 150–400)
RBC: 3.56 MIL/uL — AB (ref 4.22–5.81)
RBC: 3.66 MIL/uL — ABNORMAL LOW (ref 4.22–5.81)
RDW: 14.3 % (ref 11.5–15.5)
RDW: 14.2 % (ref 11.5–15.5)
WBC: 5.4 10*3/uL (ref 4.0–10.5)
WBC: 7.2 10*3/uL (ref 4.0–10.5)

## 2013-10-08 LAB — MAGNESIUM: Magnesium: 2 mg/dL (ref 1.5–2.5)

## 2013-10-08 LAB — PROTIME-INR
INR: 1.16 (ref 0.00–1.49)
PROTHROMBIN TIME: 14.6 s (ref 11.6–15.2)

## 2013-10-08 LAB — CREATININE, SERUM
CREATININE: 1.51 mg/dL — AB (ref 0.50–1.35)
GFR calc Af Amer: 45 mL/min — ABNORMAL LOW (ref >90)
GFR calc non Af Amer: 39 mL/min — ABNORMAL LOW (ref >90)

## 2013-10-08 LAB — APTT: aPTT: 30 seconds (ref 24–37)

## 2013-10-08 SURGERY — INSERTION, ENDOVASCULAR STENT GRAFT, AORTA, ABDOMINAL
Anesthesia: General | Laterality: Right

## 2013-10-08 MED ORDER — NITROGLYCERIN 0.4 MG SL SUBL: 0.4000 mg | SUBLINGUAL_TABLET | SUBLINGUAL | Status: DC | PRN

## 2013-10-08 MED ORDER — EPHEDRINE SULFATE 50 MG/ML IJ SOLN
INTRAMUSCULAR | Status: DC | PRN
  Administered 2013-10-08 (×3): 10 mg via INTRAVENOUS

## 2013-10-08 MED ORDER — LABETALOL HCL 5 MG/ML IV SOLN
10.0000 mg | INTRAVENOUS | Status: DC | PRN
  Filled 2013-10-08: qty 4

## 2013-10-08 MED ORDER — LIDOCAINE HCL (CARDIAC) 20 MG/ML IV SOLN
INTRAVENOUS | Status: AC
  Filled 2013-10-08: qty 5

## 2013-10-08 MED ORDER — TERAZOSIN HCL 5 MG PO CAPS
5.0000 mg | ORAL_CAPSULE | Freq: Every day | ORAL | Status: DC
  Administered 2013-10-08 – 2013-10-13 (×6): 5 mg via ORAL
  Filled 2013-10-08 (×7): qty 1

## 2013-10-08 MED ORDER — GUAIFENESIN-DM 100-10 MG/5ML PO SYRP: 15.0000 mL | ORAL_SOLUTION | ORAL | Status: DC | PRN

## 2013-10-08 MED ORDER — SIMVASTATIN 20 MG PO TABS
20.0000 mg | ORAL_TABLET | Freq: Every day | ORAL | Status: DC
  Administered 2013-10-09 – 2013-10-14 (×6): 20 mg via ORAL
  Filled 2013-10-08 (×7): qty 1

## 2013-10-08 MED ORDER — HYDRALAZINE HCL 20 MG/ML IJ SOLN
10.0000 mg | INTRAMUSCULAR | Status: DC | PRN
  Administered 2013-10-08: 10 mg via INTRAVENOUS
  Filled 2013-10-08: qty 0.5

## 2013-10-08 MED ORDER — 0.9 % SODIUM CHLORIDE (POUR BTL) OPTIME
TOPICAL | Status: DC | PRN
  Administered 2013-10-08: 1000 mL

## 2013-10-08 MED ORDER — HEPARIN SODIUM (PORCINE) 1000 UNIT/ML IJ SOLN
INTRAMUSCULAR | Status: DC | PRN
  Administered 2013-10-08: 10000 [IU] via INTRAVENOUS

## 2013-10-08 MED ORDER — ONDANSETRON HCL 4 MG/2ML IJ SOLN
INTRAMUSCULAR | Status: AC
  Filled 2013-10-08: qty 2

## 2013-10-08 MED ORDER — ROCURONIUM BROMIDE 50 MG/5ML IV SOLN
INTRAVENOUS | Status: AC
  Filled 2013-10-08: qty 1

## 2013-10-08 MED ORDER — SODIUM CHLORIDE 0.9 % IV SOLN
10.0000 mg | INTRAVENOUS | Status: DC | PRN
  Administered 2013-10-08: 15 ug/min via INTRAVENOUS

## 2013-10-08 MED ORDER — DOPAMINE-DEXTROSE 3.2-5 MG/ML-% IV SOLN
3.0000 ug/kg/min | INTRAVENOUS | Status: DC
Start: 2013-10-08 — End: 2013-10-10

## 2013-10-08 MED ORDER — HYDROMORPHONE HCL PF 1 MG/ML IJ SOLN: 0.2500 mg | INTRAMUSCULAR | Status: DC | PRN

## 2013-10-08 MED ORDER — IODIXANOL 320 MG/ML IV SOLN
INTRAVENOUS | Status: DC | PRN
  Administered 2013-10-08: 106 mL via INTRAVENOUS

## 2013-10-08 MED ORDER — LACTATED RINGERS IV SOLN
INTRAVENOUS | Status: DC | PRN
  Administered 2013-10-08: 08:00:00 via INTRAVENOUS

## 2013-10-08 MED ORDER — HEPARIN SODIUM (PORCINE) 5000 UNIT/ML IJ SOLN
INTRAMUSCULAR | Status: DC | PRN
  Administered 2013-10-08: 09:00:00

## 2013-10-08 MED ORDER — HYDROMORPHONE HCL PF 1 MG/ML IJ SOLN
INTRAMUSCULAR | Status: AC
  Filled 2013-10-08: qty 1

## 2013-10-08 MED ORDER — ENOXAPARIN SODIUM 40 MG/0.4ML ~~LOC~~ SOLN
40.0000 mg | SUBCUTANEOUS | Status: DC
  Administered 2013-10-08 – 2013-10-13 (×6): 40 mg via SUBCUTANEOUS
  Filled 2013-10-08 (×7): qty 0.4

## 2013-10-08 MED ORDER — ROCURONIUM BROMIDE 100 MG/10ML IV SOLN
INTRAVENOUS | Status: DC | PRN
  Administered 2013-10-08 (×2): 10 mg via INTRAVENOUS
  Administered 2013-10-08: 50 mg via INTRAVENOUS

## 2013-10-08 MED ORDER — DOCUSATE SODIUM 100 MG PO CAPS
100.0000 mg | ORAL_CAPSULE | Freq: Every day | ORAL | Status: DC
  Administered 2013-10-09 – 2013-10-14 (×6): 100 mg via ORAL
  Filled 2013-10-08 (×6): qty 1

## 2013-10-08 MED ORDER — FENTANYL CITRATE 0.05 MG/ML IJ SOLN
INTRAMUSCULAR | Status: AC
  Filled 2013-10-08: qty 5

## 2013-10-08 MED ORDER — PROTAMINE SULFATE 10 MG/ML IV SOLN
INTRAVENOUS | Status: AC
  Filled 2013-10-08: qty 10

## 2013-10-08 MED ORDER — OXYCODONE HCL 5 MG/5ML PO SOLN: 5.0000 mg | Freq: Once | ORAL | Status: DC | PRN

## 2013-10-08 MED ORDER — NEOSTIGMINE METHYLSULFATE 1 MG/ML IJ SOLN
INTRAMUSCULAR | Status: AC
  Filled 2013-10-08: qty 10

## 2013-10-08 MED ORDER — HEPARIN SODIUM (PORCINE) 1000 UNIT/ML IJ SOLN
INTRAMUSCULAR | Status: AC
  Filled 2013-10-08: qty 1

## 2013-10-08 MED ORDER — OXYCODONE HCL 5 MG PO TABS: 5.0000 mg | ORAL_TABLET | Freq: Once | ORAL | Status: DC | PRN

## 2013-10-08 MED ORDER — PROPOFOL 10 MG/ML IV BOLUS
INTRAVENOUS | Status: AC
  Filled 2013-10-08: qty 20

## 2013-10-08 MED ORDER — PHENOL 1.4 % MT LIQD: 1.0000 | OROMUCOSAL | Status: DC | PRN

## 2013-10-08 MED ORDER — PROPOFOL 10 MG/ML IV BOLUS
INTRAVENOUS | Status: DC | PRN
  Administered 2013-10-08: 110 mg via INTRAVENOUS

## 2013-10-08 MED ORDER — LATANOPROST 0.005 % OP SOLN
1.0000 [drp] | Freq: Every day | OPHTHALMIC | Status: DC
  Administered 2013-10-08 – 2013-10-13 (×6): 1 [drp] via OPHTHALMIC
  Filled 2013-10-08 (×2): qty 2.5

## 2013-10-08 MED ORDER — ONDANSETRON HCL 4 MG/2ML IJ SOLN
INTRAMUSCULAR | Status: DC | PRN
  Administered 2013-10-08: 4 mg via INTRAVENOUS

## 2013-10-08 MED ORDER — FENTANYL CITRATE 0.05 MG/ML IJ SOLN
INTRAMUSCULAR | Status: DC | PRN
  Administered 2013-10-08 (×5): 50 ug via INTRAVENOUS

## 2013-10-08 MED ORDER — SENNOSIDES-DOCUSATE SODIUM 8.6-50 MG PO TABS
1.0000 | ORAL_TABLET | Freq: Every evening | ORAL | Status: DC | PRN
  Administered 2013-10-08 – 2013-10-13 (×2): 1 via ORAL
  Filled 2013-10-08 (×2): qty 1

## 2013-10-08 MED ORDER — HYDROCODONE-ACETAMINOPHEN 5-325 MG PO TABS
1.0000 | ORAL_TABLET | Freq: Four times a day (QID) | ORAL | Status: DC | PRN
  Administered 2013-10-11 – 2013-10-14 (×5): 1 via ORAL
  Filled 2013-10-08 (×5): qty 1

## 2013-10-08 MED ORDER — ASPIRIN 325 MG PO TABS
162.5000 mg | ORAL_TABLET | Freq: Every day | ORAL | Status: DC
  Administered 2013-10-08 – 2013-10-09 (×2): 162.5 mg via ORAL
  Filled 2013-10-08 (×3): qty 0.5

## 2013-10-08 MED ORDER — ACETAMINOPHEN 650 MG RE SUPP: 325.0000 mg | RECTAL | Status: DC | PRN

## 2013-10-08 MED ORDER — LIDOCAINE HCL (CARDIAC) 20 MG/ML IV SOLN
INTRAVENOUS | Status: DC | PRN
  Administered 2013-10-08: 80 mg via INTRAVENOUS

## 2013-10-08 MED ORDER — GLYCOPYRROLATE 0.2 MG/ML IJ SOLN
INTRAMUSCULAR | Status: AC
  Filled 2013-10-08: qty 2

## 2013-10-08 MED ORDER — ONDANSETRON HCL 4 MG/2ML IJ SOLN: 4.0000 mg | Freq: Four times a day (QID) | INTRAMUSCULAR | Status: DC | PRN

## 2013-10-08 MED ORDER — ACETAMINOPHEN 325 MG PO TABS
325.0000 mg | ORAL_TABLET | ORAL | Status: DC | PRN
  Administered 2013-10-08 – 2013-10-13 (×5): 650 mg via ORAL
  Filled 2013-10-08 (×6): qty 2

## 2013-10-08 MED ORDER — NEOSTIGMINE METHYLSULFATE 1 MG/ML IJ SOLN
INTRAMUSCULAR | Status: DC | PRN
  Administered 2013-10-08: 2 mg via INTRAVENOUS

## 2013-10-08 MED ORDER — PROMETHAZINE HCL 25 MG/ML IJ SOLN: 6.2500 mg | INTRAMUSCULAR | Status: DC | PRN

## 2013-10-08 MED ORDER — DEXTROSE-NACL 5-0.45 % IV SOLN
INTRAVENOUS | Status: DC
  Administered 2013-10-09: via INTRAVENOUS

## 2013-10-08 MED ORDER — FUROSEMIDE 40 MG PO TABS
40.0000 mg | ORAL_TABLET | Freq: Every day | ORAL | Status: DC | PRN
  Administered 2013-10-09 – 2013-10-14 (×5): 40 mg via ORAL
  Filled 2013-10-08 (×6): qty 1

## 2013-10-08 MED ORDER — SODIUM CHLORIDE 0.9 % IV SOLN: 500.0000 mL | Freq: Once | INTRAVENOUS | Status: AC | PRN

## 2013-10-08 MED ORDER — GLYCOPYRROLATE 0.2 MG/ML IJ SOLN
INTRAMUSCULAR | Status: DC | PRN
  Administered 2013-10-08: 0.2 mg via INTRAVENOUS
  Administered 2013-10-08: 0.3 mg via INTRAVENOUS

## 2013-10-08 MED ORDER — PROTAMINE SULFATE 10 MG/ML IV SOLN
INTRAVENOUS | Status: DC | PRN
  Administered 2013-10-08: 100 mg via INTRAVENOUS

## 2013-10-08 MED ORDER — DEXTROSE 5 % IV SOLN
1.5000 g | Freq: Two times a day (BID) | INTRAVENOUS | Status: AC
  Administered 2013-10-08 – 2013-10-09 (×2): 1.5 g via INTRAVENOUS
  Filled 2013-10-08 (×2): qty 1.5

## 2013-10-08 MED ORDER — MORPHINE SULFATE 2 MG/ML IJ SOLN
2.0000 mg | INTRAMUSCULAR | Status: DC | PRN
  Administered 2013-10-08 – 2013-10-09 (×4): 2 mg via INTRAVENOUS
  Filled 2013-10-08 (×4): qty 1

## 2013-10-08 MED ORDER — HYDRALAZINE HCL 20 MG/ML IJ SOLN
INTRAMUSCULAR | Status: AC
  Filled 2013-10-08: qty 1

## 2013-10-08 MED ORDER — ALUM & MAG HYDROXIDE-SIMETH 200-200-20 MG/5ML PO SUSP: 15.0000 mL | ORAL | Status: DC | PRN

## 2013-10-08 MED ORDER — PANTOPRAZOLE SODIUM 40 MG PO TBEC
40.0000 mg | DELAYED_RELEASE_TABLET | Freq: Every day | ORAL | Status: DC
  Administered 2013-10-09 – 2013-10-14 (×6): 40 mg via ORAL
  Filled 2013-10-08 (×6): qty 1

## 2013-10-08 MED ORDER — MAGNESIUM SULFATE 40 MG/ML IJ SOLN
2.0000 g | Freq: Every day | INTRAMUSCULAR | Status: DC | PRN
  Filled 2013-10-08: qty 50

## 2013-10-08 MED ORDER — POTASSIUM CHLORIDE CRYS ER 20 MEQ PO TBCR: 20.0000 meq | EXTENDED_RELEASE_TABLET | Freq: Every day | ORAL | Status: DC | PRN

## 2013-10-08 MED ORDER — METOPROLOL TARTRATE 1 MG/ML IV SOLN: 2.0000 mg | INTRAVENOUS | Status: DC | PRN

## 2013-10-08 MED ORDER — OXYCODONE-ACETAMINOPHEN 5-325 MG PO TABS
1.0000 | ORAL_TABLET | ORAL | Status: DC | PRN
  Administered 2013-10-13 – 2013-10-14 (×3): 1 via ORAL
  Filled 2013-10-08 (×3): qty 1

## 2013-10-08 SURGICAL SUPPLY — 89 items
ADH SKN CLS APL DERMABOND .7 (GAUZE/BANDAGES/DRESSINGS) ×2
BAG BANDED W/RUBBER/TAPE 36X54 (MISCELLANEOUS) ×3 IMPLANT
BAG EQP BAND 135X91 W/RBR TAPE (MISCELLANEOUS) ×2
BAG SNAP BAND KOVER 36X36 (MISCELLANEOUS) ×3 IMPLANT
CANISTER SUCTION 2500CC (MISCELLANEOUS) ×3 IMPLANT
CATH ANGIO 5F BER2 100CM (CATHETERS) ×1 IMPLANT
CATH BEACON 5.038 65CM KMP-01 (CATHETERS) ×1 IMPLANT
CATH CROSS OVER TEMPO 5F (CATHETERS) ×1 IMPLANT
CATH OMNI FLUSH .035X70CM (CATHETERS) ×1 IMPLANT
CLIP TI MEDIUM 24 (CLIP) IMPLANT
CLIP TI WIDE RED SMALL 24 (CLIP) IMPLANT
COIL EMBL 14X103.2 LOOP (Vascular Products) IMPLANT
COIL NESTER 14X10 (Vascular Products) ×6 IMPLANT
COIL NESTER 14X12 (Vascular Products) ×13 IMPLANT
COVER DOME SNAP 22 D (MISCELLANEOUS) ×3 IMPLANT
COVER MAYO STAND STRL (DRAPES) ×3 IMPLANT
COVER PROBE W GEL 5X96 (DRAPES) ×3 IMPLANT
COVER SURGICAL LIGHT HANDLE (MISCELLANEOUS) ×3 IMPLANT
DERMABOND ADVANCED (GAUZE/BANDAGES/DRESSINGS) ×1
DERMABOND ADVANCED .7 DNX12 (GAUZE/BANDAGES/DRESSINGS) ×2 IMPLANT
DEVICE CLOSURE PERCLS PRGLD 6F (VASCULAR PRODUCTS) IMPLANT
DRAPE TABLE COVER HEAVY DUTY (DRAPES) ×3 IMPLANT
DRESSING OPSITE X SMALL 2X3 (GAUZE/BANDAGES/DRESSINGS) ×5 IMPLANT
DRYSEAL FLEXSHEATH 12FR 33CM (SHEATH) ×1
DRYSEAL FLEXSHEATH 18FR 33CM (SHEATH) ×1
ELECT CAUTERY BLADE 6.4 (BLADE) ×3 IMPLANT
ELECT REM PT RETURN 9FT ADLT (ELECTROSURGICAL) ×6
ELECTRODE REM PT RTRN 9FT ADLT (ELECTROSURGICAL) ×4 IMPLANT
EXCLUDER TNK LEG 28MX14X14 (Endovascular Graft) IMPLANT
EXCLUDER TRUNK LEG 28MX14X14 (Endovascular Graft) ×3 IMPLANT
GAUZE SPONGE 2X2 8PLY STRL LF (GAUZE/BANDAGES/DRESSINGS) IMPLANT
GLOVE BIO SURGEON STRL SZ7.5 (GLOVE) ×3 IMPLANT
GLOVE BIOGEL PI IND STRL 6.5 (GLOVE) IMPLANT
GLOVE BIOGEL PI IND STRL 7.5 (GLOVE) IMPLANT
GLOVE BIOGEL PI IND STRL 8 (GLOVE) IMPLANT
GLOVE BIOGEL PI INDICATOR 6.5 (GLOVE) ×2
GLOVE BIOGEL PI INDICATOR 7.5 (GLOVE) ×1
GLOVE BIOGEL PI INDICATOR 8 (GLOVE) ×1
GLOVE SS BIOGEL STRL SZ 7 (GLOVE) IMPLANT
GLOVE SUPERSENSE BIOGEL SZ 7 (GLOVE) ×1
GOWN STRL REUS W/ TWL LRG LVL3 (GOWN DISPOSABLE) ×6 IMPLANT
GOWN STRL REUS W/TWL LRG LVL3 (GOWN DISPOSABLE) ×9
GRAFT BALLN CATH 65CM (STENTS) IMPLANT
GRAFT EXCLUDER AORTIC 28X3.3CM (Endovascular Graft) ×1 IMPLANT
GRAFT EXCLUDER LEG 12X10 (Endovascular Graft) ×1 IMPLANT
GRAFT EXCLUDER LEG 12X14 (Endovascular Graft) ×1 IMPLANT
KIT BASIN OR (CUSTOM PROCEDURE TRAY) ×3 IMPLANT
KIT ROOM TURNOVER OR (KITS) ×3 IMPLANT
LEG CONTRALATERAL 23X10 (Vascular Products) ×1 IMPLANT
NDL PERC 18GX7CM (NEEDLE) ×2 IMPLANT
NEEDLE PERC 18GX7CM (NEEDLE) ×3 IMPLANT
NS IRRIG 1000ML POUR BTL (IV SOLUTION) ×3 IMPLANT
PACK AORTA (CUSTOM PROCEDURE TRAY) ×3 IMPLANT
PAD ARMBOARD 7.5X6 YLW CONV (MISCELLANEOUS) ×6 IMPLANT
PENCIL BUTTON HOLSTER BLD 10FT (ELECTRODE) IMPLANT
PERCLOSE PROGLIDE 6F (VASCULAR PRODUCTS) ×15
PLUG VASCULAR AMPLATZER 10MM (Vascular Products) ×1 IMPLANT
PLUG VASCULAR AMPLATZER 12MM (Vascular Products) ×1 IMPLANT
PROTECTION STATION PRESSURIZED (MISCELLANEOUS) ×3
SHEATH AVANTI 11CM 8FR (MISCELLANEOUS) ×1 IMPLANT
SHEATH BRITE TIP 8FR 23CM (MISCELLANEOUS) ×1 IMPLANT
SHEATH DRYSEAL FLEX 12FR 33CM (SHEATH) IMPLANT
SHEATH DRYSEAL FLEX 18FR 33CM (SHEATH) IMPLANT
SHEATH PINNACLE 6F 10CM (SHEATH) ×1 IMPLANT
SHEATH PINNACLE ST 7F 45CM (SHEATH) ×1 IMPLANT
SPONGE GAUZE 2X2 STER 10/PKG (GAUZE/BANDAGES/DRESSINGS) ×1
SPONGE SURGIFOAM ABS GEL 100 (HEMOSTASIS) IMPLANT
STAPLER VISISTAT 35W (STAPLE) IMPLANT
STATION PROTECTION PRESSURIZED (MISCELLANEOUS) IMPLANT
STENT GRAFT BALLN CATH 65CM (STENTS) ×1
STOPCOCK MORSE 400PSI 3WAY (MISCELLANEOUS) ×3 IMPLANT
SUT PROLENE 5 0 C 1 24 (SUTURE) IMPLANT
SUT VIC AB 2-0 CT1 27 (SUTURE)
SUT VIC AB 2-0 CT1 TAPERPNT 27 (SUTURE) IMPLANT
SUT VIC AB 3-0 SH 27 (SUTURE)
SUT VIC AB 3-0 SH 27X BRD (SUTURE) IMPLANT
SUT VICRYL 4-0 PS2 18IN ABS (SUTURE) ×6 IMPLANT
SYR 20CC LL (SYRINGE) ×6 IMPLANT
SYR 30ML LL (SYRINGE) IMPLANT
SYR 5ML LL (SYRINGE) ×3 IMPLANT
SYR MEDRAD MARK V 150ML (SYRINGE) ×3 IMPLANT
SYRINGE 10CC LL (SYRINGE) ×9 IMPLANT
TOWEL OR 17X24 6PK STRL BLUE (TOWEL DISPOSABLE) ×6 IMPLANT
TOWEL OR 17X26 10 PK STRL BLUE (TOWEL DISPOSABLE) ×6 IMPLANT
TRAY FOLEY CATH 16FRSI W/METER (SET/KITS/TRAYS/PACK) ×3 IMPLANT
TUBING HIGH PRESSURE 120CM (CONNECTOR) ×3 IMPLANT
WIRE AMPLATZ SS-J .035X180CM (WIRE) ×2 IMPLANT
WIRE BENTSON .035X145CM (WIRE) ×2 IMPLANT
WIRE HI TORQ VERSACORE J 260CM (WIRE) ×1 IMPLANT

## 2013-10-08 NOTE — Anesthesia Postprocedure Evaluation (Signed)
  Anesthesia Post-op Note  Patient: Robert Ritter  Procedure(s) Performed: Procedure(s): ABDOMINAL AORTIC ENDOVASCULAR STENT GRAFT (N/A) COIL EMBOLIZATION (NESTERS)- RIGHT INTERNAL ILIAC ANEURYSM (Right)  Patient Location: PACU  Anesthesia Type:General  Level of Consciousness: awake and alert   Airway and Oxygen Therapy: Patient Spontanous Breathing  Post-op Pain: mild  Post-op Assessment: Post-op Vital signs reviewed  Post-op Vital Signs: stable  Last Vitals:  Filed Vitals:   10/08/13 1430  BP: 127/39  Pulse: 64  Temp: 36.5 C  Resp: 16    Complications: No apparent anesthesia complications

## 2013-10-08 NOTE — Anesthesia Preprocedure Evaluation (Addendum)
Anesthesia Evaluation  Patient identified by MRN, date of birth, ID band Patient awake    Reviewed: Allergy & Precautions, H&P , NPO status   History of Anesthesia Complications Negative for: history of anesthetic complications  Airway Mallampati: II TM Distance: <3 FB Neck ROM: Full    Dental  (+) Dental Advisory Given   Pulmonary former smoker,  breath sounds clear to auscultation        Cardiovascular hypertension, + CAD, + Past MI, + CABG and + Peripheral Vascular Disease + Valvular Problems/Murmurs MR and AI Rhythm:Regular Rate:Normal  EKG and ECHO results noted in chart   Neuro/Psych    GI/Hepatic   Endo/Other    Renal/GU Renal InsufficiencyRenal disease     Musculoskeletal   Abdominal   Peds  Hematology  (+) anemia ,   Anesthesia Other Findings   Reproductive/Obstetrics                         Anesthesia Physical Anesthesia Plan  ASA: III  Anesthesia Plan: General   Post-op Pain Management:    Induction: Intravenous  Airway Management Planned: Oral ETT  Additional Equipment: CVP and Arterial line  Intra-op Plan:   Post-operative Plan: Extubation in OR  Informed Consent: I have reviewed the patients History and Physical, chart, labs and discussed the procedure including the risks, benefits and alternatives for the proposed anesthesia with the patient or authorized representative who has indicated his/her understanding and acceptance.   Dental advisory given  Plan Discussed with: CRNA and Surgeon  Anesthesia Plan Comments:         Anesthesia Quick Evaluation

## 2013-10-08 NOTE — H&P (View-Only) (Signed)
[facility] HISTORY AND PHYSICAL   History of Present Illness:  Patient is a 80 y.o. year old male who presents for evaluation of abdominal aortic aneurysm. The aneurysm has been followed with serial ultrasound for several years. He was recently noted a 5.6 cm in diameter. He denies any abdominal or back pain. He has had no prior abdominal surgery. He has had a left inguinal hernia repair. He overall is very active..  Other medical problems include coronary artery disease followed by Dr. Sherren Mocha, Hyperlipidemia, hypertension, renal insufficiency (Cr 1.5-1.8 over last 2 years)all of which are currently stable.  Past Medical History  Diagnosis Date  . CAD (coronary artery disease)     S/P CABG and multiple PCI's  . Hyperlipidemia   . HTN (hypertension)   . AAA (abdominal aortic aneurysm)     5 cm on 2011 assessment  . Gout 2009  . Renal insufficiency   . Elevated PSA     Dr Rosana Hoes  . Anemia   . Myocardial infarction     Past Surgical History  Procedure Laterality Date  . Coronary artery bypass graft  1981  . Hernia repair    . Inner ear surgery      Social History History  Substance Use Topics  . Smoking status: Former Research scientist (life sciences)  . Smokeless tobacco: Not on file  . Alcohol Use: No    Family History Family History  Problem Relation Age of Onset  . Hypertension Other   . Heart disease Father     before age 16  . Heart attack Father     Allergies  Allergies  Allergen Reactions  . Sulfa Antibiotics Rash     Current Outpatient Prescriptions  Medication Sig Dispense Refill  . aspirin 325 MG tablet Take 162.5 mg by mouth daily.      . bimatoprost (LUMIGAN) 0.03 % ophthalmic drops Place 1 drop into both eyes at bedtime.        . celecoxib (CELEBREX) 200 MG capsule Take 200 mg by mouth daily as needed for moderate pain.      . fish oil-omega-3 fatty acids 1000 MG capsule Take 1 g by mouth daily.       . furosemide (LASIX) 80 MG tablet  Take 0.5 tablets (40 mg total) by mouth daily as needed for edema.  90 tablet  3  . HYDROcodone-acetaminophen (NORCO/VICODIN) 5-325 MG per tablet Take 1 tablet by mouth every 6 (six) hours as needed.  15 tablet  0  . nitroGLYCERIN (NITROSTAT) 0.4 MG SL tablet Place 0.4 mg under the tongue as needed for chest pain. Chest pain      . PRESCRIPTION MEDICATION Take 1 capsule by mouth 2 (two) times daily. antibiotic      . simvastatin (ZOCOR) 20 MG tablet Take 20 mg by mouth daily.      Marland Kitchen telmisartan (MICARDIS) 40 MG tablet Take 40 mg by mouth daily as needed (hypertension).      . terazosin (HYTRIN) 5 MG capsule Take 5 mg by mouth at bedtime.       No current facility-administered medications for this visit.    ROS:   General:  No weight loss, Fever, chills  HEENT: No recent headaches, no nasal bleeding, no visual changes, no sore throat  Neurologic: No dizziness, blackouts, seizures. No recent symptoms of stroke or mini- stroke. No recent episodes of slurred speech, or temporary blindness.  Cardiac: No recent episodes of chest pain/pressure, no shortness of  breath at rest.  No shortness of breath with exertion.  Denies history of atrial fibrillation or irregular heartbeat  Vascular: No history of rest pain in feet.  No history of claudication.  No history of non-healing ulcer, No history of DVT   Pulmonary: No home oxygen, no productive cough, no hemoptysis,  No asthma or wheezing  Musculoskeletal:  [ ]  Arthritis, [ ]  Low back pain,  [ ]  Joint pain  Hematologic:No history of hypercoagulable state.  No history of easy bleeding.  No history of anemia  Gastrointestinal: No hematochezia or melena,  No gastroesophageal reflux, no trouble swallowing  Urinary: [ x] chronic Kidney disease, [ ]  on HD - [ ]  MWF or [ ]  TTHS, [ ]  Burning with urination, [ ]  Frequent urination, [ ]  Difficulty urinating;   Skin: No rashes  Psychological: No history of anxiety,  No history of depression   Physical  Examination  Filed Vitals:   09/25/13 1011  BP: 143/64  Pulse: 51  Height: 5\' 10"  (1.778 m)  Weight: 162 lb 1.6 oz (73.528 kg)  SpO2: 100%    Body mass index is 23.26 kg/(m^2).  General:  Alert and oriented, no acute distress HEENT: Normal Neck: No bruit or JVD Pulmonary: Clear to auscultation bilaterally Cardiac: Regular Rate and Rhythm 2/6 systolic murmur  Abdomen: Soft, non-tender, non-distended, easily palpable epigastric pulsatile mass Skin: No rash Extremity Pulses:  2+ radial, brachial, femoral, absent right dorsalis pedis, posterior tibial pulses 2+ left posterior tibial pulse absent dorsalis pedis pulse,  the patient had very full 3+ popliteal pulses Musculoskeletal: No deformity or edema  Neurologic: Upper and lower extremity motor 5/5 and symmetric  DATA:  CT angiogram the abdomen and pelvis is reviewed. This is dated March 24. This shows a 5.6 cm infrarenal abdominal aortic aneurysm. No evidence of rupture. Infrarenal neck measures 23 mm diameter some tortuosity of the iliacs and infrarenal neck. 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  Popliteal ultrasound was also obtained they do to the full pulses bilaterally. 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  PLAN:  Most likely the patient should be a candidate for endovascular repair of the internal iliac aneurysm as well as infrarenal aneurysm. This would require coil embolization of the right internal iliac artery with extension into the external iliac artery.  Discussion with family health but about risks benefits possible complications and procedure details of endovascular aneurysm repair. We will also have to check his renal function preoperatively. He is scheduled to have the aneurysm repair on April 15. If we are unable to fix his aneurysm via an endovascular  approach we may wait until he reaches 6 cm due to his age before considering open repair.  However if his renal function is off on that morning we may have to stage and this in 2 pieces having the coil embolization first followed by the infrarenal AAA repair at a later date.  Ruta Hinds, MD [facility] [place] Office: [phone] Pager: [phone]

## 2013-10-08 NOTE — Anesthesia Procedure Notes (Signed)
Anesthesia Procedure Note CVP: Timeout, sterile prep, drape, FBP R neck.  Trendelenburg position.  1% lido local, finder and trocar RIJ 1st pass with US guidance.  2 lumen placed over J wire. Biopatch and sterile dressing on.  Patient tolerated well.  VSS.  Jenita Seashore, MD  07:55-08:05

## 2013-10-08 NOTE — Transfer of Care (Signed)
Immediate Anesthesia Transfer of Care Note  Patient: Robert Ritter  Procedure(s) Performed: Procedure(s): ABDOMINAL AORTIC ENDOVASCULAR STENT GRAFT (N/A) COIL EMBOLIZATION (NESTERS)- RIGHT INTERNAL ILIAC ANEURYSM (Right)  Patient Location: PACU  Anesthesia Type:General  Level of Consciousness: awake, alert , oriented and patient cooperative  Airway & Oxygen Therapy: Patient Spontanous Breathing and Patient connected to face mask oxygen  Post-op Assessment: Report given to PACU RN and Post -op Vital signs reviewed and stable  Post vital signs: Reviewed and stable  Complications: No apparent anesthesia complications

## 2013-10-08 NOTE — Progress Notes (Signed)
Red area noted to sacral area.  Non blanchable, with no open area noted.  Sacral dressing applied per protocol.

## 2013-10-08 NOTE — Progress Notes (Addendum)
Patient admitted at 1445 from PACU s/p AAA repair. Oriented x4, A line patent, denies c/o pain. See echarting for additional data

## 2013-10-08 NOTE — Interval H&P Note (Signed)
History and Physical Interval Note:  10/08/2013 8:14 AM  Robert Ritter  has presented today for surgery, with the diagnosis of Infarenal AAA and Right iliac artery aneurysm  The various methods of treatment have been discussed with the patient and family. After consideration of risks, benefits and other options for treatment, the patient has consented to  Procedure(s): ABDOMINAL AORTIC ENDOVASCULAR STENT GRAFT (N/A) COIL EMBOLIZATION (NESTERS)- RIGHT INTERNAL ILIAC ANEURYSM (Right) as a surgical intervention .  The patient's history has been reviewed, patient examined, no change in status, stable for surgery.  I have reviewed the patient's chart and labs.  Questions were answered to the patient's satisfaction.     Elam Dutch

## 2013-10-08 NOTE — Op Note (Signed)
Procedure: 1. 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  Gore excluder infrarenal aneurysm repair  Anesthesia : General  Asst: Tinnie Gens MD, Gerri Lins, PA-C  Operative findings:   #1 Bilateral Proglide closure   #2 [id number] cm main body Gore Excluder device delivered via a left femoral system   #3 12 x 14 cm right iliac limb contralateral    #4 23 x 10 ipsilateral iliac extension left   #5 12 x10  contralateral iliac extension right                 #6 coil embolization right internal iliac with Nestor/amplatzer              #7 28 x 3.3 proximal aortic extension              #8 18 Fr sheath left, 12 Fr sheath right  PROCEDURE DETAIL: After obtaining informed consent the patient was taken to the operating room. The patient was placed in supine position the operating room table. After induction of general anesthesia and endotracheal intubation a Foley catheter was placed. Next the patient was prepped and draped in usual sterile fashion from the nipples down to the knees. Ultrasound was used to identify the left common femoral artery as well as the femoral bifurcation. An 11 blade was used to make a small neck in the skin over the level of the right common femoral artery. An introducer needle was then used to cannulate the left common femoral artery without difficulty. A 0.035 Bentson wire was then threaded up into the abdominal aorta through the left femoral system. A short 6 [place] dilator was placed over the guidewire the left femoral system. This was used to dilate the tract. The dilator was then removed and a Proglide device inserted over the guidewire into the left femoral system and this was deployed at the 11:00 position. The Proglide device was then removed and an additional Proglide device was brought in operative field and deployed at the 3:00 position. The sutures were kept separate and tagged with suture tags. Next a 7 French Terumo sheath was brought up in the  operative field and advanced over the guidewire into the left iliac system. A 5 French crossover catheter was then used to selectively catheterize the right common iliac artery. The guidewire was then advanced through this. The guidewire immediately tracked into the right internal iliac artery. The crossover catheter was exchanged over the guidewire for a 45 Berenstein catheter. This was parked deep inside the right internal iliac aneurysm. I confirmed that we were in the right internal iliac artery with contrast angiogram. The guidewire was then advanced deep into the aneurysm. The parents and catheter was pulled back over the guidewire and the dilator for the sheath was placed back inside. The sheath and dilator was advanced over the guidewire into the orifice of the right internal iliac artery. The Berenstein catheter was then placed back over the wire deep into the right internal iliac artery aneurysm. I then proceeded to place several 12 mm Nestor coils to fill the aneurysm. 2 12 size Amplatzer plugs were then placed in the proximal right internal iliac artery to completely occlude the vessel. The Berenstein catheter was pulled back up to the aortic bifurcation and turned into the more proximal aorta. The guidewire was advanced up into the distal descending thoracic aorta. The Berenstein catheter was advanced over this. The guidewire was then exchanged for an 035 Amplatz wire. The Berenstein catheter  was removed over the guidewire. A 7 French sheath was then exchanged over the guidewire for an 18 [place] Gore dry seal sheath was was advanced into the abdominal aorta.  Attention was then turned to the right groin. Again using ultrasound the right common femoral artery was identified. The femoral bifurcation was also identified. A small nick was made in the skin with the 11 blade. A hemostat was used to dilate a tract down to the artery. An introducer needle was then used to cannulate the right common femoral  artery without difficulty. A 0.035 Bentson wire was then threaded up into the abdominal aorta under fluoroscopic guidance. A 9 French dilator was then placed over the wire to dilate the tract. Two Proglide devices were again brought on operative field and these were fired sequentially in the 10:00 position followed by an additional Proglide at the 2:00 position. The Proglide delivery systems were removed and the 12 French sheath placed back over the guidewire up to the level of the iliac of the aortic bifurcation. The patient was given 10,000 units of intravenous heparin.  At this point, a 5 [place] Omni flush catheter was placed over the guidewire and the right groin up and the abdominal aorta. An abdominal aortogram was obtained in the AP position to determine level of the left and right renal arteries. This was done at slight craniocaudal angulation. At this point a 28 x 14 x 14 Gore Excluder main body device was selected.  The main body device was then placed over the Amplatz wire in the right groin and advanced up to the level of the renal arteries.Magnified views of the renal arteries were performed to make sure that we were not covering these. The top portion of the stent graft was then deployed with the end of the stent just below the level of the right renal artery and this came over to just below the level of the left renal artery. The main body was delivered all the way down to the contralateral gate. Attention was then turned to the right groin. The Omni flush catheter was pulled down over a guidewire and removed and the Bentson wire placed in position to cannulate the contralateral gate. This was done using a buddy wire technique with an 035 Amplatz wire still in the descending thoracic aorta.  The KMP catheter was placed back over the guidewire in the right femoral system. A 5 [place] KMP catheter was placed over this and this was used to selectively catheterize the contralateral gate and the guidewire  was then advanced into the descending thoracic aorta. The main body portion of the gate was confirmed by twirling the pigtail catheter. An Amplatz wire was placed back through the pigtail catheter.  The pigtail catheter was removed over the guidewire and the 12 x 14 cm limb advanced so there was full coverage of the long marker on the contralateral limb. This was then deployed in the usual fashion down to the iliac bifurcation. The delivery system was then removed. A 12 x 10 extension was then placed in the right side and the ipsilateral iliac extended passe the internal iliac into the external iliac artery.  The remainder of the ipsilateral iliac limb was also deployed.  A retrograde contrast angiogram was also performed to make sure that the left iliac limb did not cover the left internal iliac artery.  Measurement was made of the left iliac bifurcation and a 23 x10 cm ipsilateral limb was placed.  Attention was then  turned back to the left iliac system and a Gore aortic balloon placed over the wire up to the level of the proximal end of the stent and this was ballooned to profile. The limb attachment site was also ballooned as well as the distal attachment site. Attention was then turned to the right groin and the balloon was advanced over the guidewire on the right side and the distal attachment site as well as the midportion of iliac limb were also ballooned. The 5 [place] Omni flush catheter was then placed back to the guidewire on the right side and a completion arteriogram was obtained. This showed a proximal type one leak.  At this point a 28 x 3.3 cm cuff was advanced via the left femoral system and deployed right at the level of the renal arteries using angiographic guidance.  Everything at all attachements and endpoints was reballooned.  A completion arteriogram was again obtained.  This showed no evidence of proximal or distal type I endoleak. There was a type II endoleak from most likely left side  lumbar arteries.   At this point the Omni flush catheter was removed over a guidewire. All delivery devices were removed. We then proceeded to remove the large 18 French sheath from the right side with the guidewire in place. With pressure held above and below the insertion site the lateral and medial Proglide closure was secured down. There was good hemostasis. The guidewire was removed from the left side. Attention was then turned to the right groin. In similar fashion the 12 French sheath was removed and the guidewire left in place. With pressure held above and below the insertion site the lateral and medial Proglide closure was secured down. There was good hemostasis. The guidewire was removed from the left side.There was good hemostasis and the Bentsen wire was removed from the left groin. The patient had been given 10000 units of heparin before introducing the main body device. This was fully reversed with 100 mg of protamine at the end of the case. Each groin puncture site was then closed with a running 4-0 Vicryl subcuticular stitch.  The patient tolerated procedure well and there were no complications. Instrument sponge and needle counts were correct at the end of the case. Patient was awakened in the operating room extubated and taken to the recovery room in stable condition.  Ruta Hinds, MD [facility] [place] Office: [phone] Pager: [phone]

## 2013-10-09 ENCOUNTER — Encounter (HOSPITAL_COMMUNITY): Payer: Self-pay | Admitting: Vascular Surgery

## 2013-10-09 LAB — CBC
HEMATOCRIT: 32.6 % — AB (ref 39.0–52.0)
Hemoglobin: 10.1 g/dL — ABNORMAL LOW (ref 13.0–17.0)
MCH: 27.8 pg (ref 26.0–34.0)
MCHC: 31 g/dL (ref 30.0–36.0)
MCV: 89.8 fL (ref 78.0–100.0)
Platelets: 98 10*3/uL — ABNORMAL LOW (ref 150–400)
RBC: 3.63 MIL/uL — ABNORMAL LOW (ref 4.22–5.81)
RDW: 14.5 % (ref 11.5–15.5)
WBC: 7.8 10*3/uL (ref 4.0–10.5)

## 2013-10-09 LAB — BASIC METABOLIC PANEL
BUN: 22 mg/dL (ref 6–23)
CALCIUM: 8.4 mg/dL (ref 8.4–10.5)
CHLORIDE: 105 meq/L (ref 96–112)
CO2: 24 mEq/L (ref 19–32)
CREATININE: 1.48 mg/dL — AB (ref 0.50–1.35)
GFR calc non Af Amer: 40 mL/min — ABNORMAL LOW (ref >90)
GFR, EST AFRICAN AMERICAN: 46 mL/min — AB (ref >90)
Glucose, Bld: 147 mg/dL — ABNORMAL HIGH (ref 70–99)
Potassium: 4.5 mEq/L (ref 3.7–5.3)
Sodium: 140 mEq/L (ref 137–147)

## 2013-10-09 MED ORDER — HYDROCODONE-ACETAMINOPHEN 5-325 MG PO TABS: 1.0000 | ORAL_TABLET | Freq: Four times a day (QID) | ORAL | Status: DC | PRN

## 2013-10-09 MED ORDER — FLEET ENEMA 7-19 GM/118ML RE ENEM
1.0000 | ENEMA | Freq: Once | RECTAL | Status: AC
  Administered 2013-10-09: 1 via RECTAL
  Filled 2013-10-09: qty 1

## 2013-10-09 MED ORDER — TAMSULOSIN HCL 0.4 MG PO CAPS
0.4000 mg | ORAL_CAPSULE | Freq: Once | ORAL | Status: AC
  Administered 2013-10-09: 0.4 mg via ORAL
  Filled 2013-10-09: qty 1

## 2013-10-09 NOTE — Progress Notes (Signed)
Pt still not able to void post foley removal. Pt I&O cathed after bladder scan of 299, and was given 0.4mg  flomax, and 40mg  lasix today. 2nd bladder scan showed 224 @ 1830. MD Bridgett Larsson notified and said that pt will stay tonight and be reassessed tomorrow morning.

## 2013-10-09 NOTE — Progress Notes (Addendum)
[facility] [place]  Subjective  - Doing well.  He has not voided or ambulated in the halls yet.   Objective 144/50 65 98.8 F (37.1 C) (Oral) 22 95%  Intake/Output Summary (Last 24 hours) at 10/09/13 0728 Last data filed at 10/09/13 0600  Gross per 24 hour  Intake   2730 ml  Output   1970 ml  Net    760 ml    Palpable DP left, bilateral feet are warm to touch and well perfused Incisions are soft  with out hematoma    Assessment/Planning: POD #1 Procedure: 1. 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 Gore excluder infrarenal aneurysm repair Plan to D/C home once ambulated, voided and taking PO's well   Ulyses Amor 10/09/2013 7:28 AM -- Looks good Ready to go home No hematoma Will d/c home later today  Ruta Hinds, MD [facility] [place]: [phone] Pager: [phone]  Laboratory Lab Results:  Recent Labs  10/08/13 1530 10/09/13 0525  WBC 7.2 7.8  HGB 10.4* 10.1*  HCT 32.6* 32.6*  PLT 101* 98*   BMET  Recent Labs  10/08/13 1245 10/08/13 1530 10/09/13 0525  NA 142  --  140  K 4.0  --  4.5  CL 106  --  105  CO2 24  --  24  GLUCOSE 92  --  147*  BUN 27*  --  22  CREATININE 1.44* 1.51* 1.48*  CALCIUM 8.2*  --  8.4    COAG Lab Results  Component Value Date   INR 1.16 10/08/2013   INR 1.00 10/07/2013   No results found for this basename: PTT

## 2013-10-09 NOTE — Progress Notes (Signed)
Patient unable to void post foley removal. Patient has urgency, but no success when attempted to void. Bladder scanned >999. MD notified. Ordered foley to be placed. Will continue to monitor.  Domingo Dimes RN

## 2013-10-09 NOTE — Progress Notes (Signed)
   Pt reported with sx urinary retention. Bladder scan with evidence of some limited urinary retention.  Given pt's age and prior CRI, will cancel discharge and give the patient time to resolve his retention.  If he continues to be symptomatic and bladder volumes increase, placement of foley catheter and evaluation with Urology might be necessary.  Adele Barthel, MD [facility] [place] Office: [phone] Pager: [phone]  10/09/2013, 7:03 PM

## 2013-10-09 NOTE — Progress Notes (Signed)
Pt transferred to 2W33 via wheelchair, with RN and on monitor with meds and belongings. Pt wife with him. Receiving RN and NT aware of pt arrival, elink, CCM aware as well.

## 2013-10-10 LAB — BASIC METABOLIC PANEL
BUN: 21 mg/dL (ref 6–23)
CHLORIDE: 99 meq/L (ref 96–112)
CO2: 23 mEq/L (ref 19–32)
CREATININE: 1.4 mg/dL — AB (ref 0.50–1.35)
Calcium: 8.2 mg/dL — ABNORMAL LOW (ref 8.4–10.5)
GFR calc Af Amer: 49 mL/min — ABNORMAL LOW (ref >90)
GFR calc non Af Amer: 42 mL/min — ABNORMAL LOW (ref >90)
Glucose, Bld: 118 mg/dL — ABNORMAL HIGH (ref 70–99)
Potassium: 4.3 mEq/L (ref 3.7–5.3)
Sodium: 134 mEq/L — ABNORMAL LOW (ref 137–147)

## 2013-10-10 MED ORDER — ASPIRIN EC 81 MG PO TBEC
162.0000 mg | DELAYED_RELEASE_TABLET | Freq: Every day | ORAL | Status: DC
  Administered 2013-10-10 – 2013-10-14 (×5): 162 mg via ORAL
  Filled 2013-10-10 (×5): qty 2

## 2013-10-10 MED ORDER — TAMSULOSIN HCL 0.4 MG PO CAPS: 0.4000 mg | ORAL_CAPSULE | Freq: Every day | ORAL | Status: DC

## 2013-10-10 NOTE — Progress Notes (Signed)
[facility] Progress Note  10/10/2013 9:21 AM 2 Days Post-Op  Subjective:  Difficulty urinating last night, now has Foley  Afebrile VSS 94% RA  Filed Vitals:   10/10/13 0600  BP: 137/59  Pulse: 80  Temp: 98.5 F (36.9 C)  Resp: 18    Physical Exam:  Abdomen: soft non tender Incisions: needle punctures no hematom Extremities: feet pink warm GU: blood tinged urine  CBC    Component Value Date/Time   WBC 7.8 10/09/2013 0525   RBC 3.63* 10/09/2013 0525   HGB 10.1* 10/09/2013 0525   HCT 32.6* 10/09/2013 0525   PLT 98* 10/09/2013 0525   MCV 89.8 10/09/2013 0525   MCH 27.8 10/09/2013 0525   MCHC 31.0 10/09/2013 0525   RDW 14.5 10/09/2013 0525   LYMPHSABS 1.1 07/12/2012 1138   MONOABS 0.5 07/12/2012 1138   EOSABS 0.7 07/12/2012 1138   BASOSABS 0.0 07/12/2012 1138    BMET    Component Value Date/Time   NA 140 10/09/2013 0525   K 4.5 10/09/2013 0525   CL 105 10/09/2013 0525   CO2 24 10/09/2013 0525   GLUCOSE 147* 10/09/2013 0525   BUN 22 10/09/2013 0525   CREATININE 1.48* 10/09/2013 0525   CALCIUM 8.4 10/09/2013 0525   GFRNONAA 40* 10/09/2013 0525   GFRAA 46* 10/09/2013 0525    INR    Component Value Date/Time   INR 1.16 10/08/2013 1245     Intake/Output Summary (Last 24 hours) at 10/10/13 0921 Last data filed at 10/10/13 0820  Gross per 24 hour  Intake   1050 ml  Output    300 ml  Net    750 ml     Assessment/Plan:  80 y.o. male is s/p  #1 Bilateral Proglide closure  #2 28x14x14 cm main body Gore Excluder device delivered via a left femoral system  #3 12 x 14 cm right iliac limb contralateral  #4 23 x 10 ipsilateral iliac extension left  #5 12 x10 contralateral iliac extension right  #6 coil embolization right internal iliac with Nestor/amplatzer  #7 28 x 3.3 proximal aortic extension  #8 18 Fr sheath left, 12 Fr sheath right 2 Days Post-Op  -Pt unable to void last pm-foley place last night at [facility].  He is received a one time dose of  Flomax-will continue this daily   Leontine Locket, PA-C [facility] [phone] 10/10/2013 9:21 AM  Has had issues with urine output in past.  Was followed by Dr Mercy Moore.  No known outflow issues. Will check BMET this am.  If renal function stable will try to d/c foley again later this morning.  Ruta Hinds, MD [facility] [place] Office: [phone] Pager: [phone]

## 2013-10-10 NOTE — Progress Notes (Signed)
14 french Foley placed. Patient tolerated procedure well. Will continue to monitor. Call bell within reach.   Robert Ritter

## 2013-10-10 NOTE — Care Management Note (Signed)
    Page 1 of 1   10/13/2013     4:08:46 PM CARE MANAGEMENT NOTE 10/13/2013  Patient:  Robert Ritter, Robert Ritter   Account Number:  [id number]  Date Initiated:  10/10/2013  Documentation initiated by:  Emalie Mcwethy  Subjective/Objective Assessment:   PT S/P COIL EMBOLIZATION OF RT INTERNAL ILIAC ANEURYSM ON 4/15.  PTA, PT RESIDES AT [facility].     Action/Plan:   WOULD RECOMMEND P.T./O.T. CONSULTS TO EVALUATE FOR HOME NEEDS.   Anticipated DC Date:  10/13/2013   Anticipated DC Plan:  SKILLED NURSING FACILITY  In-house referral  Clinical Social Worker      DC Planning Services  CM consult      Choice offered to / List presented to:             Status of service:  Completed, signed off Medicare Important Message given?   (If response is "NO", the following Medicare IM given date fields will be blank) Date Medicare IM given:   Date Additional Medicare IM given:    Discharge Disposition:  [facility]  Per UR Regulation:  Reviewed for med. necessity/level of care/duration of stay  If discussed at [place] of Stay Meetings, dates discussed:    Comments:  10/13/13 Ashley Murrain, BSN [phone] Pt for dc to SNF today, per CSW arrangements.

## 2013-10-11 NOTE — Evaluation (Signed)
Physical Therapy Evaluation Patient Details Name: Robert Ritter MRN: [id number] DOB: Nov 07, 1919 Today's Date: 10/11/2013   History of Present Illness  Underwent following proceure on 10/08/13: 1. 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 Gore excluder infrarenal aneurysm repair.  Also with post-op urinary retention.   Clinical Impression  Pt admitted after above surgery. Pt currently with functional limitations due to the deficits listed below (see PT Problem List). Pt will benefit from skilled PT to increase their independence and safety with mobility to allow discharge to SNF for short term rehab.  Considering pt and caregivers age feel pt would be much safer to DC to SNF for rehab until he is stronger and able to care for himself at home. Pt was active and independent PTA.       Follow Up Recommendations SNF;Supervision for mobility/OOB    Equipment Recommendations  None recommended by PT (pt recently purchased a rollator walker but has not used yet)    Recommendations for Other Services       Precautions / Restrictions Restrictions Weight Bearing Restrictions: No      Mobility  Bed Mobility Overal bed mobility: Needs Assistance Bed Mobility: Supine to Sit     Supine to sit: Min guard;HOB elevated (used rails)        Transfers Overall transfer level: Needs assistance   Transfers: Sit to/from Stand Sit to Stand: Min assist         General transfer comment: verbal and tactile cues for safest technique  Ambulation/Gait Ambulation/Gait assistance: Min assist Ambulation Distance (Feet): 20 Feet (Pt had just ambulated about 35 feet with RN prior to PT eval) Assistive device: Rolling walker (2 wheeled) Gait Pattern/deviations: Step-to pattern;Decreased stride length;Shuffle Gait velocity: decreased Gait velocity interpretation: <1.8 ft/sec, indicative of risk for recurrent falls General Gait Details: difficulty picking feet up with gait  Stairs             Wheelchair Mobility    Modified Rankin (Stroke Patients Only)       Balance Overall balance assessment: Needs assistance Sitting-balance support: Feet supported;No upper extremity supported Sitting balance-Leahy Scale: Good (NT greater than good)     Standing balance support: Bilateral upper extremity supported Standing balance-Leahy Scale: Poor Standing balance comment: Leaned posterior when first standing                             Pertinent Vitals/Pain Pt did not report any pain at present.     Home Living Family/patient expects to be discharged to:: Skilled nursing facility                 Additional Comments: Lives in a 1 story house with 2 step entry from garage. Active PTA.     Prior Function Level of Independence: Independent               Hand Dominance        Extremity/Trunk Assessment   Upper Extremity Assessment: Overall WFL for tasks assessed           Lower Extremity Assessment: Generalized weakness (Weakness with functional mobility)      Cervical / Trunk Assessment: Normal  Communication   Communication: No difficulties  Cognition Arousal/Alertness: Awake/alert Behavior During Therapy: WFL for tasks assessed/performed Overall Cognitive Status: Within Functional Limits for tasks assessed                      General  Comments General comments (skin integrity, edema, etc.): Wife present for entire sessiona and daughter for part of session.  They were very excited that the pt was able to walk some.  Discussed DC planning and wife and pt favor SNF placement for short term rehaba and I agree that is the best option.    Exercises        Assessment/Plan    PT Assessment    PT Diagnosis     PT Problem List    PT Treatment Interventions     PT Goals (Current goals can be found in the Care Plan section) Acute Rehab PT Goals Patient Stated Goal: to increase strength PT Goal Formulation: With  patient/family Time For Goal Achievement: 10/18/13 Potential to Achieve Goals: Good    Frequency     Barriers to discharge        Co-evaluation               End of Session Equipment Utilized During Treatment: Gait belt Activity Tolerance: Patient tolerated treatment well Patient left: in bed;with call bell/phone within reach;with family/visitor present           Time: 3295-1884 PT Time Calculation (min): 32 min   Charges:   PT Evaluation $Initial PT Evaluation Tier I: 1 Procedure PT Treatments $Gait Training: 8-22 mins   PT G CodesVincent Ritter [facility] 10/11/2013, 4:10 PM Lavonia Dana, PT  [phone] 10/11/2013

## 2013-10-11 NOTE — Progress Notes (Addendum)
[facility] Progress Note  10/11/2013 8:54 AM 3 Days Post-Op  Subjective:  No complaints; family states he has not ambulated in a couple of days.  Family is in favor of SNF for transition to home as his wife is 54 and cannot care for him by herself at home.  Tm 99.4 now afebrile VSS 96% RA  Filed Vitals:   10/11/13 0624  BP: 153/59  Pulse: 67  Temp: 98.2 F (36.8 C)  Resp: 18    Physical Exam: Incisions:  Bilateral groins are soft without hematoma   CBC    Component Value Date/Time   WBC 7.8 10/09/2013 0525   RBC 3.63* 10/09/2013 0525   HGB 10.1* 10/09/2013 0525   HCT 32.6* 10/09/2013 0525   PLT 98* 10/09/2013 0525   MCV 89.8 10/09/2013 0525   MCH 27.8 10/09/2013 0525   MCHC 31.0 10/09/2013 0525   RDW 14.5 10/09/2013 0525   LYMPHSABS 1.1 07/12/2012 1138   MONOABS 0.5 07/12/2012 1138   EOSABS 0.7 07/12/2012 1138   BASOSABS 0.0 07/12/2012 1138    BMET    Component Value Date/Time   NA 134* 10/10/2013 1005   K 4.3 10/10/2013 1005   CL 99 10/10/2013 1005   CO2 23 10/10/2013 1005   GLUCOSE 118* 10/10/2013 1005   BUN 21 10/10/2013 1005   CREATININE 1.40* 10/10/2013 1005   CALCIUM 8.2* 10/10/2013 1005   GFRNONAA 42* 10/10/2013 1005   GFRAA 49* 10/10/2013 1005    INR    Component Value Date/Time   INR 1.16 10/08/2013 1245     Intake/Output Summary (Last 24 hours) at 10/11/13 0854 Last data filed at 10/11/13 0500  Gross per 24 hour  Intake    360 ml  Output   2650 ml  Net  -2290 ml     Assessment:  80 y.o. male is s/p:  #1 Bilateral Proglide closure  #2 28x14x14 cm main body Gore Excluder device delivered via a left femoral system  #3 12 x 14 cm right iliac limb contralateral  #4 23 x 10 ipsilateral iliac extension left  #5 12 x10 contralateral iliac extension right  #6 coil embolization right internal iliac with Nestor/amplatzer  #7 28 x 3.3 proximal aortic extension  #8 18 Fr sheath left, 12 Fr sheath right  3 Days Post-Op  Plan: -pt had to  have foley replaced yesterday evening.  He will need to have the foley for a week or so and follow up with urology.  He has been placed on Flomax daily. -pt needs to be OOB tid and mobilizing and ambulating. -DVT prophylaxis:  lovenox -will place social work for SNF placement.  Family would like [facility] if possible.   Leontine Locket, PA-C [facility] [phone] 10/11/2013 8:54 AM    I agree with the above.  The patient has been seen and examined.  Both feet are warm and well perfused.  Both groin access sites are soft.  His abdomen is soft.  After discussions with the family, they are leaning towards nursing home admission.  I replaced his Foley catheter yesterday which will be left in.  He will go home with this with followup with urology.  I have asked physical therapy to evaluate him today.  He needs to be out of bed ambulating today.  Annamarie Major

## 2013-10-12 DIAGNOSIS — M79609 Pain in unspecified limb: Secondary | ICD-10-CM

## 2013-10-12 NOTE — Progress Notes (Signed)
Patient's family stated that the pain in his left leg may be gout. Patient occasionally has gout flare ups. Venous duplex was negative for DVT so I paged Dr. Trula Slade to notify him of the results and to also let him know that the family states that he has gout. Family states that he either takes pain medication or a taper of prednisone for 6 days. Dr. Trula Slade only wants to control pain in left leg with pain medication ordered. Will continue to monitor closely. Glade Nurse, RN

## 2013-10-12 NOTE — Progress Notes (Signed)
VASCULAR LAB PRELIMINARY  PRELIMINARY  PRELIMINARY  PRELIMINARY  Left lower extremity venous Doppler completed.    Preliminary report:  There is no DVT or SVT noted in the left lower extremity.  Iantha Fallen, RVT 10/12/2013, 11:16 AM

## 2013-10-12 NOTE — Progress Notes (Signed)
    Subjective  - POD #4  No new complaints. Did ambulate with physical therapy yesterday   Physical Exam:  Bilateral groins are soft. Abdomen is soft. Extremities are warm and well perfused       Assessment/Plan:  POD #4  Patient did well with physical therapy yesterday.  Plan is for discharge to [facility] placed tomorrow.  Wife at bedside for discussion.  Butch Penny Lissy Deuser 10/12/2013 10:15 AM --  Danley Danker Vitals:   10/12/13 0523  BP: 161/51  Pulse: 70  Temp: 99 F (37.2 C)  Resp: 16    Intake/Output Summary (Last 24 hours) at 10/12/13 1015 Last data filed at 10/12/13 0810  Gross per 24 hour  Intake    720 ml  Output   1050 ml  Net   -330 ml     Laboratory CBC    Component Value Date/Time   WBC 7.8 10/09/2013 0525   HGB 10.1* 10/09/2013 0525   HCT 32.6* 10/09/2013 0525   PLT 98* 10/09/2013 0525    BMET    Component Value Date/Time   NA 134* 10/10/2013 1005   K 4.3 10/10/2013 1005   CL 99 10/10/2013 1005   CO2 23 10/10/2013 1005   GLUCOSE 118* 10/10/2013 1005   BUN 21 10/10/2013 1005   CREATININE 1.40* 10/10/2013 1005   CALCIUM 8.2* 10/10/2013 1005   GFRNONAA 42* 10/10/2013 1005   GFRAA 49* 10/10/2013 1005    COAG Lab Results  Component Value Date   INR 1.16 10/08/2013   INR 1.00 10/07/2013   No results found for this basename: PTT    Antibiotics Anti-infectives   Start     Dose/Rate Route Frequency Ordered Stop   10/08/13 2100  cefUROXime (ZINACEF) 1.5 g in dextrose 5 % 50 mL IVPB     1.5 g 100 mL/hr over 30 Minutes Intravenous Every 12 hours 10/08/13 1514 10/09/13 1012   10/07/13 1441  cefUROXime (ZINACEF) 1.5 g in dextrose 5 % 50 mL IVPB     1.5 g 100 mL/hr over 30 Minutes Intravenous 30 min pre-op 10/07/13 1441 10/08/13 0855       V. Leia Alf, M.D. [facility] [place] Office: [phone] Pager:  [phone]

## 2013-10-13 ENCOUNTER — Other Ambulatory Visit: Payer: Self-pay

## 2013-10-13 DIAGNOSIS — I714 Abdominal aortic aneurysm, without rupture, unspecified: Secondary | ICD-10-CM

## 2013-10-13 DIAGNOSIS — Z48812 Encounter for surgical aftercare following surgery on the circulatory system: Secondary | ICD-10-CM

## 2013-10-13 MED ORDER — MAGNESIUM CITRATE PO SOLN
300.0000 mL | Freq: Once | ORAL | Status: AC
  Administered 2013-10-13: 300 mL via ORAL
  Filled 2013-10-13: qty 592

## 2013-10-13 MED ORDER — FLEET ENEMA 7-19 GM/118ML RE ENEM
1.0000 | ENEMA | Freq: Once | RECTAL | Status: AC
  Administered 2013-10-13: 14:00:00 via RECTAL
  Filled 2013-10-13: qty 1

## 2013-10-13 NOTE — Progress Notes (Addendum)
Clinical Social Work Department CLINICAL SOCIAL WORK PLACEMENT NOTE 10/13/2013  Patient:  Robert Ritter, Robert Ritter  Account Number:  [id number] Admit date:  10/08/2013  Clinical Social Worker:  Megan Salon  Date/time:  10/13/2013 08:44 AM  Clinical Social Work is seeking post-discharge placement for this patient at the following level of care:   [place]   (*CSW will update this form in Epic as items are completed)   10/13/2013  Patient/family provided with [facility] Department of Clinical Social Work's list of facilities offering this level of care within the geographic area requested by the patient (or if unable, by the patient's family).  10/13/2013  Patient/family informed of their freedom to choose among providers that offer the needed level of care, that participate in Medicare, Medicaid or managed care program needed by the patient, have an available bed and are willing to accept the patient.  10/13/2013  Patient/family informed of [facility]' ownership interest in [facility] as well as of the fact that they are under no obligation to receive care at this facility.  PASARR submitted to EDS on 10/13/2013 PASARR number received from EDS on 10/13/2013  FL2 transmitted to all facilities in geographic area requested by pt/family on  10/13/2013 FL2 transmitted to all facilities within larger geographic area on   Patient informed that his/her managed care company has contracts with or will negotiate with  certain facilities, including the following:     Patient/family informed of bed offers received:  10/13/2013 Patient chooses bed at [facility] Physician recommends and patient chooses bed at    Patient to be transferred to [facility] on  10/14/2013 Patient to be transferred to facility by EMS  The following physician request were entered in Epic:   Additional Comments:  Jeanette Caprice, MSW, [place]

## 2013-10-13 NOTE — Progress Notes (Signed)
PT Cancellation Note  Patient Details Name: Robert Ritter MRN: [id number] DOB: Apr 30, 1920   Cancelled Treatment:    Reason Eval/Treat Not Completed: Pain limiting ability to participate.  Attempted PT earlier in the day. Pt was in 10/10 pain in left leg.  RN alerted.  Pt and family anticipating DC to SNF today.   Vincent Peyer [facility] 10/13/2013, 4:27 PM Lavonia Dana, [place] 10/13/2013

## 2013-10-13 NOTE — Progress Notes (Signed)
[facility] [place]  Subjective  - complaining of multiple aches and pains, no BM since admission 5 days ago   Objective 153/47 60 98.1 F (36.7 C) (Oral) 18 97%  Intake/Output Summary (Last 24 hours) at 10/13/13 1248 Last data filed at 10/13/13 1660  Gross per 24 hour  Intake    720 ml  Output    825 ml  Net   -105 ml   2+ femoral pulses Mild diffuse abdominal pain Right leg pain no obvious inflammation of knee or ankle  Assessment/Planning: 1. ? Gout pt states he has had it before but no obvious inflammed joints 2.  Constipation will Rx today if has BM will d/c to SNF today. 3. Urinary retention will d/c home with leg bag and follow up with urology 4.  AAA stent graft repair will follow up with me in 3 weeks with CT    Elam Dutch 10/13/2013 12:48 PM --  Laboratory Lab Results: No results found for this basename: WBC, HGB, HCT, PLT,  in the last 72 hours BMET No results found for this basename: NA, K, CL, CO2, GLUCOSE, BUN, CREATININE, CALCIUM,  in the last 72 hours  COAG Lab Results  Component Value Date   INR 1.16 10/08/2013   INR 1.00 10/07/2013   No results found for this basename: PTT

## 2013-10-13 NOTE — Progress Notes (Signed)
CSW was informed by RN that patient is not being discharged today. CSW updated [facility] and will continue to follow.  Jeanette Caprice, MSW, [place]

## 2013-10-13 NOTE — Discharge Summary (Addendum)
[facility] Discharge Summary  Robert Ritter December 12, 1919 80 y.o. male  [id number]  Admission Date: 10/08/2013  Discharge Date: 10/14/13  Physician: Elam Dutch, MD  Admission Diagnosis: Infarenal AAA and Right iliac artery aneurysm   HPI:   This is a 80 y.o. male who presents for evaluation of abdominal aortic aneurysm. The aneurysm has been followed with serial ultrasound for several years. He was recently noted a 5.6 cm in diameter. He denies any abdominal or back pain. He has had no prior abdominal surgery. He has had a left inguinal hernia repair. He overall is very active.. Other medical problems include coronary artery disease followed by Dr. Sherren Mocha, Hyperlipidemia, hypertension, renal insufficiency (Cr 1.5-1.8 over last 2 years)all of which are currently stable.  Hospital Course:  The patient was admitted to the hospital and taken to the operating room on 10/08/2013 and underwent: #1 Bilateral Proglide closure  #2 28x14x14 cm main body Gore Excluder device delivered via a left femoral system  #3 12 x 14 cm right iliac limb contralateral  #4 23 x 10 ipsilateral iliac extension left  #5 12 x10 contralateral iliac extension right  #6 coil embolization right internal iliac with Nestor/amplatzer  #7 28 x 3.3 proximal aortic extension  #8 18 Fr sheath left, 12 Fr sheath right    The pt tolerated the procedure well and was transported to the PACU in good condition.   By that evening, he was having some urinary retention.  A BMP was checked later that day and his creatinine had improved.  His foley was removed and he was still having retention.  The foley was replaced again and he is discharged with this and to follow up with urology in a week.  He has been followed by Dr. Rosana Hoes in the past.  He was given a dose of Flomax x 1, but this was discontinued per pharmacy as the pt is on Hytrin.   He did complain of pain in his left leg and states  that he has had gout in the past.  His knee is warm to touch.   Dr. Oneida Alar spoke to the pharmacist and with his renal function, recommended colchicine 1.2 mg today and then 0.6mg  every other day for a week and then discontinue.  He will f/u with PCP in 1 week to check BMP.  He has also complained of constipation.  He has received 2 enema's, dulcolax and magnesium citrate without out success.  He was checked for impaction and this was negative.  The remainder of the hospital course consisted of increasing mobilization and increasing intake of solids without difficulty.  CBC    Component Value Date/Time   WBC 7.8 10/14/2013 0445   RBC 3.40* 10/14/2013 0445   HGB 9.6* 10/14/2013 0445   HCT 30.1* 10/14/2013 0445   PLT 146* 10/14/2013 0445   MCV 88.5 10/14/2013 0445   MCH 28.2 10/14/2013 0445   MCHC 31.9 10/14/2013 0445   RDW 13.9 10/14/2013 0445   LYMPHSABS 1.1 07/12/2012 1138   MONOABS 0.5 07/12/2012 1138   EOSABS 0.7 07/12/2012 1138   BASOSABS 0.0 07/12/2012 1138    BMET    Component Value Date/Time   NA 138 10/14/2013 0445   K 4.6 10/14/2013 0445   CL 100 10/14/2013 0445   CO2 25 10/14/2013 0445   GLUCOSE 123* 10/14/2013 0445   BUN 29* 10/14/2013 0445   CREATININE 1.53* 10/14/2013 0445   CALCIUM 8.4 10/14/2013 0445  GFRNONAA 38* 10/14/2013 0445   GFRAA 44* 10/14/2013 0445     Discharge Instructions:   The patient is discharged to home with extensive instructions on wound care and progressive ambulation.  They are instructed not to drive or perform any heavy lifting until returning to see the physician in his office.      Discharge Orders   Future Appointments Provider Department Dept Phone   10/20/2013 8:00 AM [facility] [place] [phone]   10/22/2013 2:15 PM Cassandria Anger, MD [place] [phone]   11/20/2013 12:30 PM Elam Dutch, MD [facility] [phone]   Future  Orders Complete By Expires   ABDOMINAL PROCEDURE/ANEURYSM REPAIR/AORTO-BIFEMORAL BYPASS:  Call MD for increased abdominal pain; cramping diarrhea; nausea/vomiting  As directed    Call MD for:  redness, tenderness, or signs of infection (pain, swelling, bleeding, redness, odor or green/yellow discharge around incision site)  As directed    Call MD for:  severe or increased pain, loss or decreased feeling  in affected limb(s)  As directed    Call MD for:  temperature >100.5  As directed    Discharge instructions  As directed    Driving Restrictions  As directed    Increase activity slowly  As directed    Lifting restrictions  As directed    May shower   As directed    Resume previous diet  As directed    Resume previous diet  As directed       Discharge Diagnosis:  Infarenal AAA and Right iliac artery aneurysm  Secondary Diagnosis: Patient Active Problem List   Diagnosis Date Noted  . AAA (abdominal aortic aneurysm) 10/08/2013  . Osteoarthritis 07/23/2013  . Tongue ulcer 02/27/2013  . Fatigue 07/12/2012  . Right leg weakness 07/12/2012  . Eczema 04/05/2012  . Neoplasm of uncertain behavior of skin 08/02/2010  . Actinic keratosis 08/02/2010  . ANEMIA, NORMOCYTIC 04/04/2010  . PSA, INCREASED 04/04/2010  . TOBACCO USE, QUIT 07/08/2009  . RENAL INSUFFICIENCY 10/19/2008  . ABDOMINAL AORTIC ANEURYSM 08/31/2008  . Gout, unspecified 03/11/2008  . FOOT PAIN 03/11/2008  . HYPERLIPIDEMIA 05/15/2007  . HYPERTENSION 05/15/2007  . CORONARY ARTERY DISEASE 05/15/2007   Past Medical History  Diagnosis Date  . CAD (coronary artery disease)     S/P CABG and multiple PCI's  . Hyperlipidemia   . HTN (hypertension)   . AAA (abdominal aortic aneurysm)     5 cm on 2011 assessment  . Gout 2009  . Elevated PSA     Dr Rosana Hoes  . Anemia   . Myocardial infarction   . Renal insufficiency   . Glaucoma   . Central retinal vein occlusion of left eye   . Skin cancer of face        Medication  List         aspirin 325 MG tablet  Take 162.5 mg by mouth daily.     bimatoprost 0.03 % ophthalmic solution  Commonly known as:  LUMIGAN  Place 1 drop into both eyes at bedtime.     celecoxib 200 MG capsule  Commonly known as:  CELEBREX  Take 200 mg by mouth daily as needed for mild pain or moderate pain.     colchicine 0.6 MG tablet  Take 1 tablet (0.6 mg total) by mouth every other day.  Start taking on:  10/16/2013     epoetin alfa 20000 UNIT/ML injection  Commonly  known as:  EPOGEN,PROCRIT  Inject 20,000 Units into the skin every 28 (twenty-eight) days.     fish oil-omega-3 fatty acids 1000 MG capsule  Take 1 g by mouth daily.     furosemide 80 MG tablet  Commonly known as:  LASIX  Take 0.5 tablets (40 mg total) by mouth daily as needed for edema.     HYDROcodone-acetaminophen 5-325 MG per tablet  Commonly known as:  NORCO/VICODIN  Take 1 tablet by mouth every 6 (six) hours as needed for moderate pain.     nitroGLYCERIN 0.4 MG SL tablet  Commonly known as:  NITROSTAT  Place 0.4 mg under the tongue as needed for chest pain. Chest pain     polyethylene glycol packet  Commonly known as:  MIRALAX / GLYCOLAX  Take 17 g by mouth daily as needed.     simvastatin 20 MG tablet  Commonly known as:  ZOCOR  Take 20 mg by mouth daily.     telmisartan 40 MG tablet  Commonly known as:  MICARDIS  Take 40 mg by mouth daily as needed (hypertension).     terazosin 5 MG capsule  Commonly known as:  HYTRIN  Take 5 mg by mouth at bedtime.       Colchicine to start on 10/16/13 and end 10/23/13.  Vicodin #30 No Refill  Disposition: SNF  Patient's condition: is Good  Follow up: 1. Dr. Oneida Alar in 4 weeks with CTA 2. Urology 1 week (Our office to schedule this) 3. Dr. Alain Marion in 1 week with BMP check for renal function.  Leontine Locket, PA-C [facility] [phone] 10/14/2013  12:59 PM   - For VQI Registry use --- Instructions: Press F2 to tab  through selections.  Delete question if not applicable.   Post-op:  Time to Extubation: [ x] In OR, [ ]  < 12 hrs, [ ]  12-24 hrs, [ ]  >=24 hrs Vasopressors Req. Post-op: No MI: no, [ ]  Troponin only, [ ]  EKG or Clinical New Arrhythmia: No CHF: No ICU Stay: 1 day stepdown Transfusion: No  If yes, na/ units given  Complications: Resp failure: no, [ ]  Pneumonia, [ ]  Ventilator Chg in renal function: no, [ ]  Inc. Cr > 0.5, [ ]  Temp. Dialysis, [ ]  Permanent dialysis Leg ischemia: no, no Surgery needed, [ ]  Yes, Surgery needed, [ ]  Amputation Bowel ischemia: no, [ ]  Medical Rx, [ ]  Surgical Rx Wound complication: no, [ ]  Superficial separation/infection, [ ]  Return to OR Return to OR: No  Return to OR for bleeding: No Stroke: no, [ ]  Minor, [ ]  Major  Discharge medications: Statin use:  Yes If No: [ ]  For Medical reasons, [ ]  Non-compliant, [ ]  Not-indicated ASA use:  Yes  If No: [ ]  For Medical reasons, [ ]  Non-compliant, [ ]  Not-indicated Plavix use:  No If No: [ ]  For Medical reasons, [ ]  Non-compliant, [ ]  Not-indicated Beta blocker use:  No If No: [ ]  For Medical reasons, [ ]  Non-compliant, [ ]  Not-indicated

## 2013-10-13 NOTE — Progress Notes (Signed)
OT Cancellation Note  Patient Details Name: Robert Ritter MRN: [id number] DOB: 1920/02/19   Cancelled Treatment:    Reason Eval/Treat Not Completed: OT screened. Pt is Medicare and current D/C plan is SNF and planning to leave today. No apparent immediate acute care OT needs, therefore will defer OT to SNF. If OT eval is needed please call Acute Rehab Dept. at [phone] or text page OT at [phone].    Benito Mccreedy OTR/L [phone] 10/13/2013, 11:07 AM

## 2013-10-13 NOTE — Progress Notes (Signed)
Clinical Social Work Department BRIEF PSYCHOSOCIAL ASSESSMENT 10/13/2013  Patient:  Robert Ritter, Robert Ritter     Account Number:  [id number]     Admit date:  10/08/2013  Clinical Social Worker:  Megan Salon  Date/Time:  10/13/2013 08:42 AM  Referred by:  Physician  Date Referred:  10/12/2013 Referred for  SNF Placement   Other Referral:   Interview type:  Patient Other interview type:    PSYCHOSOCIAL DATA Living Status:  WIFE Admitted from facility:   Level of care:   Primary support name:  Nancy Marus Primary support relationship to patient:  SPOUSE Degree of support available:   Good    CURRENT CONCERNS Current Concerns  Post-Acute Placement   Other Concerns:    SOCIAL WORK ASSESSMENT / PLAN Clinical Social Worker received referral for SNF placement at d/c. CSW introduced self and explained reason for visit. Patient had visitors by bedside, patient's wife and daughter. CSW explained SNF process to patient and patient's family. Patient's wife reported she is agreeable for SNF placement and wants [facility]. Patient's daughter states that patient was independent prior to coming to the hospital and now hes weak and deconditioned and is hopeful rehab will help him get back to independence. CSW called [facility] and informed the facility of possible dc for today per RN and MD note. [facility] is able to make a bed offer. CSW went back into room and explained this to patient and family. Patient's family was very happy that [facility] made a bed offer and states that they are very pleased with this facility. Daughter asked social worker to set up EMS transport at [facility]. CSW explained that MD needs to come in and see patient first to determine if patient is being discharged.   Assessment/plan status:  Psychosocial Support/Ongoing Assessment of Needs Other assessment/ plan:   Information/referral to community resources:   SNF information    PATIENT'S/FAMILY'S RESPONSE TO PLAN OF  CARE: Patient and patient's family really wanted [facility]. Patient's family were very happy once [facility] made a bed offer. Patient's daughter and wife are hoping that rehab will help patient gain back some indepedence.       Jeanette Caprice, MSW, [place]

## 2013-10-14 ENCOUNTER — Other Ambulatory Visit: Payer: Self-pay | Admitting: *Deleted

## 2013-10-14 ENCOUNTER — Telehealth: Payer: Self-pay

## 2013-10-14 ENCOUNTER — Encounter: Payer: Self-pay | Admitting: *Deleted

## 2013-10-14 DIAGNOSIS — R339 Retention of urine, unspecified: Secondary | ICD-10-CM

## 2013-10-14 LAB — CBC
HEMATOCRIT: 30.1 % — AB (ref 39.0–52.0)
Hemoglobin: 9.6 g/dL — ABNORMAL LOW (ref 13.0–17.0)
MCH: 28.2 pg (ref 26.0–34.0)
MCHC: 31.9 g/dL (ref 30.0–36.0)
MCV: 88.5 fL (ref 78.0–100.0)
Platelets: 146 10*3/uL — ABNORMAL LOW (ref 150–400)
RBC: 3.4 MIL/uL — ABNORMAL LOW (ref 4.22–5.81)
RDW: 13.9 % (ref 11.5–15.5)
WBC: 7.8 10*3/uL (ref 4.0–10.5)

## 2013-10-14 LAB — BASIC METABOLIC PANEL
BUN: 29 mg/dL — AB (ref 6–23)
CO2: 25 mEq/L (ref 19–32)
Calcium: 8.4 mg/dL (ref 8.4–10.5)
Chloride: 100 mEq/L (ref 96–112)
Creatinine, Ser: 1.53 mg/dL — ABNORMAL HIGH (ref 0.50–1.35)
GFR, EST AFRICAN AMERICAN: 44 mL/min — AB (ref >90)
GFR, EST NON AFRICAN AMERICAN: 38 mL/min — AB (ref >90)
Glucose, Bld: 123 mg/dL — ABNORMAL HIGH (ref 70–99)
POTASSIUM: 4.6 meq/L (ref 3.7–5.3)
SODIUM: 138 meq/L (ref 137–147)

## 2013-10-14 MED ORDER — POLYETHYLENE GLYCOL 3350 17 G PO PACK: 17.0000 g | PACK | Freq: Every day | ORAL | Status: AC | PRN

## 2013-10-14 MED ORDER — BISACODYL 10 MG RE SUPP
10.0000 mg | Freq: Once | RECTAL | Status: AC
  Administered 2013-10-14: 10 mg via RECTAL
  Filled 2013-10-14: qty 1

## 2013-10-14 MED ORDER — BISACODYL 10 MG RE SUPP: 10.0000 mg | Freq: Every morning | RECTAL | Status: DC

## 2013-10-14 MED ORDER — MILK AND MOLASSES ENEMA
1.0000 | Freq: Once | RECTAL | Status: DC
  Filled 2013-10-14: qty 250

## 2013-10-14 MED ORDER — COLCHICINE 0.6 MG PO TABS
1.2000 mg | ORAL_TABLET | Freq: Every day | ORAL | Status: AC
  Administered 2013-10-14: 1.2 mg via ORAL
  Filled 2013-10-14: qty 2

## 2013-10-14 MED ORDER — COLCHICINE 0.6 MG PO TABS: 0.6000 mg | ORAL_TABLET | ORAL | Status: DC

## 2013-10-14 MED ORDER — FLEET ENEMA 7-19 GM/118ML RE ENEM
1.0000 | ENEMA | Freq: Once | RECTAL | Status: AC
  Administered 2013-10-14: 1 via RECTAL
  Filled 2013-10-14: qty 1

## 2013-10-14 NOTE — Telephone Encounter (Signed)
I did at 5 pm Thx

## 2013-10-14 NOTE — Telephone Encounter (Signed)
A physician, Dr.Fields, asked you page him when you had a chance regarding this pt,  Pager - [phone]  Thanks!

## 2013-10-14 NOTE — Addendum Note (Signed)
Addended by: Mena Goes on: 10/14/2013 05:30 PM   Modules accepted: Orders

## 2013-10-14 NOTE — Progress Notes (Signed)
Patient discharged to [facility].  Patient alert, oriented, verbally responsive, breathing regular and non-labored throughout, no s/s of distress noted throughout, no c/o pain throughout.  Patient left unit per stretcher accompanied by 2 transporters.  VS WNL.    Dirk Dress 10/14/2013 3:55 PM

## 2013-10-14 NOTE — Progress Notes (Addendum)
[facility] [place]  Subjective  - Feels better but left leg still sore especially knee, no BM   Objective 150/53 62 98.4 F (36.9 C) (Oral) 18 96%  Intake/Output Summary (Last 24 hours) at 10/14/13 1245 Last data filed at 10/14/13 0800  Gross per 24 hour  Intake    720 ml  Output    950 ml  Net   -230 ml   Abdomen soft non tender, 2+ femoral pulses Feet warm bilaterally Left knee small effusion tender to palpation warmer than right Rectal exam, mild superficial skin breakdown sacral epidermis only Large prostate no nodules, no evidence of impaction  Assessment/Planning: 1. S/p [facility] doing well 2. Constipation repeat enema today, d/c on miralax and stool softener 3.  Plan to d/c to SNF today 4.  Will arrange follow up with [facility] regarding gout will start colchicine today 1.2 mg followed by 0.6 mg qod x 1 week  Elam Dutch 10/14/2013 12:45 PM --  Laboratory Lab Results:  Recent Labs  10/14/13 0445  WBC 7.8  HGB 9.6*  HCT 30.1*  PLT 146*   BMET  Recent Labs  10/14/13 0445  NA 138  K 4.6  CL 100  CO2 25  GLUCOSE 123*  BUN 29*  CREATININE 1.53*  CALCIUM 8.4    COAG Lab Results  Component Value Date   INR 1.16 10/08/2013   INR 1.00 10/07/2013   No results found for this basename: PTT

## 2013-10-14 NOTE — Progress Notes (Signed)
Clinical Social Worker facilitated patient discharge by contacting the patient, family and facility,[facility]. Patient and patient's family by bedside are agreeable to this plan and arranging transport via EMS . CSW will sign off, as social work intervention is no longer needed.  Jeanette Caprice, MSW, [place]

## 2013-10-15 ENCOUNTER — Telehealth: Payer: Self-pay | Admitting: Vascular Surgery

## 2013-10-15 ENCOUNTER — Non-Acute Institutional Stay (SKILLED_NURSING_FACILITY): Payer: Medicare Other | Admitting: Internal Medicine

## 2013-10-15 DIAGNOSIS — I714 Abdominal aortic aneurysm, without rupture, unspecified: Secondary | ICD-10-CM

## 2013-10-15 DIAGNOSIS — I2510 Atherosclerotic heart disease of native coronary artery without angina pectoris: Secondary | ICD-10-CM

## 2013-10-15 DIAGNOSIS — D62 Acute posthemorrhagic anemia: Secondary | ICD-10-CM

## 2013-10-15 DIAGNOSIS — I10 Essential (primary) hypertension: Secondary | ICD-10-CM

## 2013-10-15 NOTE — Telephone Encounter (Signed)
Message copied by Gena Fray on Wed Oct 15, 2013  1:41 PM ------      Message from: Denman George      Created: Mon Oct 13, 2013 12:37 PM      Regarding: Zigmund Daniel log; 4 wk f/ u with CTA and needs Urology appt. this week.       Rec'd phone call from [facility] about this pt.  He is being d/c'd to SNF soon, but needs referral to Urology this week for Urinary Retention / has a foley catheter.      ----- Message -----         From: Gabriel Earing, PA-C         Sent: 10/13/2013  12:23 PM           To: [facility]vs Charge Pool            S/p [facility] 10/08/13.  F/u with Dr. Oneida Alar in 4 weeks with CTA protocol.              Thanks,      Aldona Bar       ------

## 2013-10-15 NOTE — Telephone Encounter (Signed)
Mailed CT instructions to Janett Billow at [facility] as well as patients wife [place].

## 2013-10-16 ENCOUNTER — Encounter: Payer: Self-pay | Admitting: *Deleted

## 2013-10-18 DIAGNOSIS — D62 Acute posthemorrhagic anemia: Secondary | ICD-10-CM | POA: Insufficient documentation

## 2013-10-18 NOTE — Progress Notes (Signed)
HISTORY & PHYSICAL  DATE: 10/15/2013   FACILITY: [facility]  LEVEL OF CARE: SNF (31)  ALLERGIES:  Allergies  Allergen Reactions  . Sulfa Antibiotics Rash    CHIEF COMPLAINT:  Manage abdominal aortic aneurysm, CAD and hypertension  HISTORY OF PRESENT ILLNESS: Patient is a 80 year old Caucasian male who was hospitalized for abdominal aortic aneurysm repair, then admitted to this facility for short-term rehabilitation.  AAA: Patient had an infrarenal abdominal aortic aneurysm and right iliac artery aneurysm, which were followed by serial ultrasounds for several years. The abdomen and an aortic aneurysm dimension 5.6 cm in recent ultrasound. He was asymptomatic. He had aneurysm repair surgery and tolerated the procedure well. He denies abdominal pain and back pain.  HTN: Pt 's HTN remains stable.  Denies CP, sob, DOE, pedal edema, headaches, dizziness or visual disturbances.  No complications from the medications currently being used.  Last BP : 145/59.  CAD: The angina has been stable. The patient denies dyspnea on exertion, orthopnea, pedal edema, palpitations and paroxysmal nocturnal dyspnea. No complications noted from the medication presently being used.  PAST MEDICAL HISTORY :  Past Medical History  Diagnosis Date  . CAD (coronary artery disease)     S/P CABG and multiple PCI's  . Hyperlipidemia   . HTN (hypertension)   . AAA (abdominal aortic aneurysm)     5 cm on 2011 assessment  . Gout 2009  . Elevated PSA     Dr Rosana Hoes  . Myocardial infarction   . Renal insufficiency   . Glaucoma   . Central retinal vein occlusion of left eye   . Skin cancer of face   . Ulcer 07/23/2012    tongue  . Anemia     normocytic  . Myocardial infarction     PAST SURGICAL HISTORY: Past Surgical History  Procedure Laterality Date  . Coronary artery bypass graft  1981  . Hernia repair    . Inner ear surgery    . Abdominal aortic endovascular stent graft  N/A 10/08/2013    Procedure: ABDOMINAL AORTIC ENDOVASCULAR STENT GRAFT;  Surgeon: Elam Dutch, MD;  Location: [place];  Service: Vascular;  Laterality: N/A;  . Embolization Right 10/08/2013    Procedure: COIL EMBOLIZATION (NESTERS)- RIGHT INTERNAL ILIAC ANEURYSM;  Surgeon: Elam Dutch, MD;  Location: [place];  Service: Vascular;  Laterality: Right;    SOCIAL HISTORY:  reports that he has quit smoking. He does not have any smokeless tobacco history on file. He reports that he does not drink alcohol or use illicit drugs.  FAMILY HISTORY:  Family History  Problem Relation Age of Onset  . Hypertension Other   . Heart disease Father     before age 13  . Heart attack Father     CURRENT MEDICATIONS: Reviewed per MAR/see medication list  REVIEW OF SYSTEMS:  See HPI otherwise 14 point ROS is negative.  PHYSICAL EXAMINATION  VS:  See VS section  GENERAL: no acute distress, normal body habitus EYES: conjunctivae normal, sclerae normal, normal eye lids MOUTH/THROAT: lips without lesions,no lesions in the mouth,tongue is without lesions,uvula elevates in midline NECK: supple, trachea midline, no neck masses, no thyroid tenderness, no thyromegaly LYMPHATICS: no LAN in the neck, no supraclavicular LAN RESPIRATORY: breathing is even & unlabored, BS CTAB CARDIAC: RRR, no murmur,no extra heart sounds, no edema GI:  ABDOMEN: abdomen soft, normal BS, no masses, no tenderness  LIVER/SPLEEN: no hepatomegaly, no splenomegaly  MUSCULOSKELETAL: HEAD: normal to inspection & palpation BACK: no kyphosis, scoliosis or spinal processes tenderness EXTREMITIES: LEFT UPPER EXTREMITY: full range of motion, normal strength & tone RIGHT UPPER EXTREMITY:  full range of motion, normal strength & tone LEFT LOWER EXTREMITY:  Unable to assess RIGHT LOWER EXTREMITY:   Moderate range of motion, normal strength & tone PSYCHIATRIC: the patient is alert & oriented to person, affect & behavior  appropriate  LABS/RADIOLOGY:  Labs reviewed: Basic Metabolic Panel:  Recent Labs  10/07/13 1110 10/08/13 1245  10/09/13 0525 10/10/13 1005 10/14/13 0445  NA 141 142  --  140 134* 138  K 4.1 4.0  --  4.5 4.3 4.6  CL 102 106  --  105 99 100  CO2 23 24  --  24 23 25   GLUCOSE 100* 92  --  147* 118* 123*  BUN 24* 27*  --  22 21 29*  CREATININE 1.36* 1.44*  < > 1.48* 1.40* 1.53*  CALCIUM 9.0 8.2*  --  8.4 8.2* 8.4  MG  --  2.0  --   --   --   --   < > = values in this interval not displayed. Liver Function Tests:  Recent Labs  02/18/13 1056 10/07/13 1110  AST 18 18  ALT 14 11  ALKPHOS 48 60  BILITOT 0.7 0.6  PROT 6.8 7.2  ALBUMIN 3.8 4.0   CBC:  Recent Labs  10/08/13 1530 10/09/13 0525 10/14/13 0445  WBC 7.2 7.8 7.8  HGB 10.4* 10.1* 9.6*  HCT 32.6* 32.6* 30.1*  MCV 89.1 89.8 88.5  PLT 101* 98* 146*   Lipid Panel:  Recent Labs  02/18/13 1056  HDL 39.90   Cardiac Enzymes:  Recent Labs  09/14/13 0040  TROPONINI <0.30     Echocardiography  Patient:    Stephanie, Touhey MR #:       [id number] Study Date: 09/15/2013 Gender:     M Age:        43 Height:     177.8cm Weight:     70.3kg BSA:        1.84m^2 Pt. Status: Room:    Dorise Bullion  SONOGRAPHER  Cindy Hazy, RDCS  ATTENDING    Ena Dawley, M.D.  PERFORMING   [facility], Outpatient cc:  ------------------------------------------------------------ LV EF: 65% -   70%  ------------------------------------------------------------ Indications:      Murmur 785.2.  ------------------------------------------------------------ History:   PMH:  Acquired from the patient and from the patient's chart.  PMH:  AAA. CAD. Fatigue.  Risk factors: Former tobacco use. Hypertension. Dyslipidemia.  ------------------------------------------------------------ Study Conclusions  - Left ventricle: The cavity size was normal. There was mild   concentric  hypertrophy. Systolic function was vigorous.   The estimated ejection fraction was in the range of 65% to   70%. Wall motion was normal; there were no regional wall   motion abnormalities. Doppler parameters are consistent   with abnormal left ventricular relaxation (grade 1   diastolic dysfunction). Doppler parameters are consistent   with both elevated ventricular end-diastolic filling   pressure and elevated left atrial filling pressure. - Aortic valve: Trileaflet; mildly thickened, mildly   calcified leaflets. Transvalvular velocity was within the   normal range. There was no stenosis. Moderate   regurgitation. - Mitral valve: Calcified annulus predominantly posterior   and involving posterior mitral valve leaflet.   Transvalvular velocity was within the normal range. There  was no evidence for stenosis. Mild regurgitation. - Left atrium: The atrium was moderately dilated. - Right ventricle: Systolic function was normal. - Pulmonic valve: Trivial regurgitation. - Pulmonary arteries: Systolic pressure was within the   normal range. - Inferior vena cava: The vessel was normal in size; the   respirophasic diameter changes were in the normal range (=   50%); findings are consistent with normal central venous   pressure.  ------------------------------------------------------------ Labs, prior tests, procedures, and surgery: Coronary artery bypass grafting.  Echocardiography.  M-mode, complete 2D, spectral Doppler, and color Doppler.  Height:  Height: 177.8cm. Height: 70in. Weight:  Weight: 70.3kg. Weight: 154.7lb.  Body mass index: BMI: 22.2kg/m^2.  Body surface area:    BSA: 1.58m^2.  Blood pressure:     156/70.  Patient status:  Outpatient. Location:  [facility]  ------------------------------------------------------------  ------------------------------------------------------------ Left ventricle:  The cavity size was normal. There was mild concentric hypertrophy.  Systolic function was vigorous. The estimated ejection fraction was in the range of 65% to 70%. Wall motion was normal; there were no regional wall motion abnormalities. Doppler parameters are consistent with abnormal left ventricular relaxation (grade 1 diastolic dysfunction). Doppler parameters are consistent with both elevated ventricular end-diastolic filling pressure and elevated left atrial filling pressure.  ------------------------------------------------------------ Aortic valve:   Trileaflet; mildly thickened, mildly calcified leaflets. Mobility was not restricted.  Doppler: Transvalvular velocity was within the normal range. There was no stenosis.  Moderate regurgitation.  ------------------------------------------------------------ Aorta:  Mild dilatation of the aortic root measuring 48 mm. Ascending aorta: The ascending aorta wasdilated measuring 48 mm.  ------------------------------------------------------------ Mitral valve:  Calcified annulus predominantly posterior and involving posterior mitral valve leaflet . Mobility was not restricted.  Doppler:  Transvalvular velocity was within the normal range. There was no evidence for stenosis.  Mild regurgitation.    Peak gradient: 110mm Hg (D).  ------------------------------------------------------------ Left atrium:  The atrium was moderately dilated.  ------------------------------------------------------------ Right ventricle:  The cavity size was normal. Wall thickness was normal. Systolic function was normal.  ------------------------------------------------------------ Pulmonic valve:    Doppler:  Transvalvular velocity was within the normal range. There was no evidence for stenosis.  Trivial regurgitation.  ------------------------------------------------------------ Tricuspid valve:   Structurally normal valve.    Doppler: Transvalvular velocity was within the normal range.   Trivial regurgitation.  ------------------------------------------------------------ Pulmonary artery:   The main pulmonary artery was normal-sized. Systolic pressure was within the normal range.   ------------------------------------------------------------ Right atrium:  The atrium was normal in size.  ------------------------------------------------------------ Pericardium:  There was no pericardial effusion.  ------------------------------------------------------------ Systemic veins: Inferior vena cava: The vessel was normal in size; the respirophasic diameter changes were in the normal range (= 50%); findings are consistent with normal central venous pressure.  ------------------------------------------------------------ Post procedure conclusions Ascending Aorta:  - Mild dilatation of the aortic root measuring 48 mm.  ------------------------------------------------------------  2D measurements        Normal  Doppler measurements   Normal Left ventricle                 Left ventricle LVID ED,   51.5 mm     43-52   Ea, lat     7.19 cm/s  ------ chord,                         ann, tiss PLAX  DP LVID ES,   35.4 mm     23-38   E/Ea, lat  17.66       ------ chord,                         ann, tiss PLAX                           DP FS, chord,   31 %      >29     Ea, med     6.47 cm/s  ------ PLAX                           ann, tiss LVPW, ED   11.4 mm     ------  DP IVS/LVPW   0.95        <1.3    E/Ea, med  19.63       ------ ratio, ED                      ann, tiss Ventricular septum             DP IVS, ED    10.8 mm     ------  LVOT LVOT                           Peak vel,    155 cm/s  ------ Diam, S      25 mm     ------  S Area       4.91 cm^2   ------  VTI, S      38.5 cm    ------ Diam         25 mm     ------  Peak          10 mm Hg ------ Aorta                          gradient, Root diam,   48 mm     ------  S ED                              Stroke vol   189 ml    ------ AAo AP       48 mm     ------  Stroke     101.1 ml/m^ ------ diam, S                        index            2 Left atrium                    Mitral valve AP dim       48 mm     ------  Peak E vel   127 cm/s  ------ AP dim     2.57 cm/m^2 <2.2    Peak A vel   114 cm/s  ------ index                          Decelerati   377 ms    150-23  on time                0                                Peak           6 mm Hg ------                                gradient,                                D                                Peak E/A     1.1       ------                                ratio                                Right ventricle                                Sa vel,     15.2 cm/s  ------                                lat ann,                                tiss DP   Noninvasive Vascular Lab  Left Lower Extremity Venous Duplex Evaluation  Patient:    Othel, Krenek MR #:       [id number] Study Date: 10/12/2013 Gender:     M Age:        23 Height: Weight: BSA: Pt. Status: Room:       [id number]    SONOGRAPHER  Sharion Dove, Beaver, Samantha  REFERRING    [place], Ridott Reports also to:  ------------------------------------------------------------ History and indications:  Indications  729.5 Pain in limb. Recent iliac aneurysm repair.  History  Diagnostic evaluation.  ------------------------------------------------------------ Study information:  Portable.  Study status:  Urgent.  Procedure:  A vascular evaluation was performed with the patient in the supine position. The right common femoral, left common femoral, left femoral, left greater saphenous, left lesser saphenous, left profunda femoral, left popliteal, left peroneal, and left posterior tibial veins were studied. Image quality was adequate.    Left  lower extremity venous duplex evaluation.    Doppler flow study including B-mode compression maneuvers of all visualized segments, color flow Doppler and selected views of pulsed wave Doppler.  Location:  Bedside.  Patient status:  Inpatient. Venous flow:  +--------------------------+-------+----------------------+ Location                  OverallFlow properties        +--------------------------+-------+----------------------+  Left common femoral       Patent Phasic; spontaneous;                                    compressible           +--------------------------+-------+----------------------+ Left femoral              Patent Compressible           +--------------------------+-------+----------------------+ Left profunda femoral     Patent Compressible           +--------------------------+-------+----------------------+ Left popliteal            Patent Phasic; spontaneous;                                    compressible           +--------------------------+-------+----------------------+ Left posterior tibial     Patent Compressible           +--------------------------+-------+----------------------+ Left peroneal             Patent Compressible           +--------------------------+-------+----------------------+ Left saphenofemoral       Patent Compressible           junction                                                +--------------------------+-------+----------------------+ Left greater saphenous    Patent Compressible           +--------------------------+-------+----------------------+ Left lesser saphenous     Patent Compressible           +--------------------------+-------+----------------------+ Right common femoral      Patent Phasic; spontaneous;                                    compressible            +--------------------------+-------+----------------------+  ------------------------------------------------------------ Summary:  - No evidence of deep vein or superficial thrombosis   involving the left lower extremity and right common   femoral vein. - No evidence of Baker's cyst on the left. Other specific details can be found in the table(s) above.    Prepared and Electronically Authenticated by  CT CERVICAL SPINE WITHOUT CONTRAST   TECHNIQUE: Multidetector CT imaging of the cervical spine was performed without intravenous contrast. Multiplanar CT image reconstructions were also generated.   COMPARISON:  None.   FINDINGS: No fractures identified involving the cervical spine. Large central disc extrusion at C3-4, with severe spinal stenosis and compression of the cord, the canal only measuring approximately 5 mm at the level of the disc extrusion. Erosion of the dens due to pannus, and there is a large amount of noncalcified pannus posterior to the dens, though the disc does not cause significant spinal stenosis. Sagittal reconstructed images demonstrate anatomic alignment, though disc space narrowing and endplate hypertrophic changes are present at every level from C3-4 through C6-7. Broad-based central and left paracentral disc extrusion is present at C6-7, resulting in mild spinal stenosis. Facet joints intact throughout with diffuse degenerative changes. Lateral masses intact throughout. Combination of facet and uncinate hypertrophy account for multilevel foraminal  stenoses including mild right C2-3, severe bilateral C3-4, severe bilateral C4-5, severe bilateral C5-6, moderate right and severe left C6-7, mild bilateral C7-T1.   IMPRESSION: 1. Large central disc extrusion at C3-4 causing severe spinal stenosis and likely marked cord compression. 2. Broad-based central and left paracentral disc extrusion at C6-7 resulting in mild spinal stenosis. 3. Diffuse  degenerative disc disease, spondylosis, and facet degenerative changes with multilevel foraminal stenoses as detailed above. 4. Noncalcified pannus posterior to the dens. The dens is eroded by the pannus. A nonemergent though urgent outpatient MRI of the cervical spine without contrast is recommended in further evaluation, as the patient is at risk for cord contusion given the severe spinal stenosis at C3-4.   CT ANGIOGRAPHY ABDOMEN AND PELVIS WITH CONTRAST AND WITHOUT CONTRAST   TECHNIQUE: Multidetector CT imaging of the abdomen and pelvis was performed using the standard protocol during bolus administration of intravenous contrast. Multiplanar reconstructed images including MIPs were obtained and reviewed to evaluate the vascular anatomy.   CONTRAST:  57mL OMNIPAQUE IOHEXOL 350 MG/ML SOLN   COMPARISON:  None.   FINDINGS: Vascular Findings:   Abdominal aorta: There is a saccular infrarenal abdominal aortic aneurysm, primarily involving the left lateral wall of the abdominal aorta which measures approximately 5.6 x 5.4 cm in greatest oblique axial dimension (image 69, series 4), approximately 5.2 cm in greatest oblique coronal dimension (coronal image 38, series 602) and approximately 5.5 cm in greatest oblique sagittal dimension (sagittal image 90, series 603). The cranial aspect of the infrarenal abdominal aortic aneurysm measures originates approximately 2 cm caudal to the take-off of the more inferior left renal artery. The abdominal aorta tapers to a near normal caliber approximately 3.5 cm cranial to the aortic bifurcation.   There is a minimal amount of eccentric calcified and noncalcified atherosclerotic plaque within the abdominal aorta, not resulting in hemodynamically significant stenosis. No abdominal aortic dissection or periaortic stranding.   Celiac artery: There is a short segment dissection involving the origin and proximal aspect of the celiac artery  (representative axial image 34, series 4, coronal image 51, series 602), not resulting in hemodynamically significant stenosis. Conventional branching pattern. The splenic artery is noted to be tortuous and heavily calcified the without definitive aneurysmal dilatation.   SMA: There is mixed calcified and noncalcified atherosclerotic plaque involving the origin and proximal aspect of the main trunk of the SMA, not resulting in hemodynamically significant stenosis. Conventional branching pattern.   Right Renal artery: Duplicated; there is a small accessory right renal artery which arises from the caudal aspect of the abdominal aorta to supply the inferior pole of the right kidney. There is a mixture of calcified and noncalcified atherosclerotic plaque involving the origin of the dominant right renal artery, not resulting in hemodynamically significant stenosis.   Left Renal artery: Solitary; there is aneurysmal dilatation of the origin/ostia of the left renal artery measuring approximately 1.4 cm and greatest oblique coronal dimension (coronal image 47, series 602). There is eccentric mixed calcified and noncalcified atherosclerotic plaque involving the origin and proximal aspect of the right renal artery not definitely resulting in a hemodynamically significant stenosis.   IMA: Patent   Right-sided pelvic vasculature: There is fusiform aneurysmal dilatation of the distal aspect of the right common iliac artery measuring approximately 1.9 cm in diameter (image 100, series 4). There is eccentric irregular calcified and noncalcified atherosclerotic plaque involving the right common iliac artery, not resulting in hemodynamically significant stenosis. The right external iliac artery is widely patent  and of normal caliber. There is aneurysmal dilatation of the proximal aspect of the right internal iliac artery measuring approximately 3.0 x 3.1 cm (image 1 await, series 4).  Additionally, there is a short segment dissection involving the proximal aspect of the right internal iliac artery (image 62, series 602, not resulting in hemodynamically significant stenosis.   Left-sided pelvic vasculature: There is fusiform aneurysmal dilatation of the distal aspect of the left common iliac artery measuring approximately 2.3 cm in diameter (image 99, series 4). The right internal iliac artery is heavily diseased though patent and of normal caliber. The right external iliac artery is tortuous but widely patent without hemodynamically significant narrowing.   Review of the MIP images confirms the above findings.     --------------------------------------------------------------------------------   Nonvascular Findings:   Evaluation of the abdominal organs is limited to the arterial phase of enhancement.   Normal hepatic contour. No discrete hyperenhancing hepatic lesions. Normal appearance of the gallbladder given degree distention. No definite radiopaque gallstones. No intra or extrahepatic biliary duct dilatation. No ascites.   There is symmetric enhancement of the bilateral kidneys. Note is made of an approximately 3.3 x 3.2 cm partially exophytic right-sided renal cyst (image 74, series 4) as well as a left-sided approximately 4.3 x 4.4 cm parapelvic renal cyst (image 64, series 4_ there is minimal mass effect upon the left renal pelvis secondary to this large left-sided parapelvic cysts. No definite renal stones on this postcontrast examination. No discrete hyper enhancing renal lesions. There is symmetric likely age related perinephric stranding. No urinary obstruction.   There is mild thickening of the crux and lateral limb of the left adrenal gland, too small to actually characterize. Normal appearance of the right adrenal gland, pancreas and spleen.   Moderate colonic stool burden without evidence of enteric obstruction. The bowel is otherwise  normal in course and caliber without wall thickening. Normal appearance of the appendix. No pneumoperitoneum, pneumatosis or portal venous gas.   Scattered shotty retroperitoneal and pelvic sidewall lymph nodes individually not enlarged by size criteria, with index right pelvic sidewall lymph node measuring 0.6 cm in greatest short axis diameter (image 118, series 4). No retroperitoneal, mesenteric pelvic or inguinal lymphadenopathy.   The prostate is markedly enlarged with nodular mass effect upon the undersurface of the urinary bladder (sagittal image 75, series 603). There is mild diffuse thickening the urinary bladder wall. No free fluid within the pelvis.   Limited visualization the lower thorax demonstrates minimal grossly symmetric subpleural ground-glass atelectasis. No discrete focal airspace opacities. No pleural effusion.   Cardiomegaly. Post median sternotomy. Calcifications in the native coronary arteries. There is suspected aneurysmal dilatation of the ascending thoracic aorta, with a the aortic root at least approximately 48 mm in diameter (image 1, series 4), incompletely imaged.   No definite acute or aggressive osseus abnormalities. A prominent Schmorl's node is identified with involving the anterior aspect of the superior endplate of the L4 vertebral body. Mild bilateral hip degenerative change.   Small mesenteric fat containing right-sided inguinal hernia.   IMPRESSION: 1. Saccular infrarenal abdominal aortic aneurysm measuring approximately 5.6 cm. 2. 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 3. 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 4. Aneurysmal dilatation of the origin/ostia of the  solitary left renal artery measured approximately 1.4 cm. 5. Aneurysmal dilatation of the bilateral common iliac arteries, the left measuring 2.3 cm, the right measuring 1.9 cm. 6. Approximately 3.1 cm right internal iliac artery. 7. Suspected aneurysmal dilatation of the ascending thoracic aorta with aortic through measuring at least 48 mm in diameter. Further evaluation could be performed with dedicated chest CT a as clinically indicated. 8. Post median sternotomy with calcifications within the native coronary arteries. 9. Marked nodular enlargement of the prostate with mass effect upon the undersurface of the bladder. Correlation with digital rectal prostate examination and symptoms of urinary retention is recommended.     PORTABLE CHEST - 1 VIEW   COMPARISON:  Portable exam 1346 hr compared to 10/07/2013   FINDINGS: Right jugular central venous catheter with tip projecting over mid SVC.   Minimal enlargement of cardiac silhouette post median sternotomy.   Mediastinal contours and pulmonary vascularity normal.   Emphysematous and bronchitic changes.   Minimal atelectasis in lower left lung.   No acute infiltrate, pleural effusion or pneumothorax.   IMPRESSION: No pneumothorax following right jugular line placement.   COPD changes with minimal atelectasis at lower left lung.   ASSESSMENT/PLAN:  Abdominal aortic aneurysm-status post repair. CAD-stable Hypertension-blood pressure borderline. Will monitor. Acute blood loss anemia-recheck Renal insufficiency- recheck Check CBC and BMP  I have reviewed patient's medical records received at admission/from hospitalization.  CPT CODE: 27517  Gayani Y Dasanayaka, [place] [phone]

## 2013-10-20 ENCOUNTER — Encounter (HOSPITAL_COMMUNITY): Payer: Medicare Other

## 2013-10-22 ENCOUNTER — Ambulatory Visit: Payer: Medicare Other | Admitting: Internal Medicine

## 2013-10-23 ENCOUNTER — Encounter: Payer: Self-pay | Admitting: Adult Health

## 2013-10-23 ENCOUNTER — Non-Acute Institutional Stay (SKILLED_NURSING_FACILITY): Payer: Medicare Other | Admitting: Adult Health

## 2013-10-23 DIAGNOSIS — I714 Abdominal aortic aneurysm, without rupture, unspecified: Secondary | ICD-10-CM

## 2013-10-23 DIAGNOSIS — I10 Essential (primary) hypertension: Secondary | ICD-10-CM

## 2013-10-23 DIAGNOSIS — N259 Disorder resulting from impaired renal tubular function, unspecified: Secondary | ICD-10-CM

## 2013-10-23 DIAGNOSIS — E785 Hyperlipidemia, unspecified: Secondary | ICD-10-CM

## 2013-10-23 DIAGNOSIS — D631 Anemia in chronic kidney disease: Secondary | ICD-10-CM | POA: Insufficient documentation

## 2013-10-23 DIAGNOSIS — I2510 Atherosclerotic heart disease of native coronary artery without angina pectoris: Secondary | ICD-10-CM

## 2013-10-23 DIAGNOSIS — N039 Chronic nephritic syndrome with unspecified morphologic changes: Secondary | ICD-10-CM

## 2013-10-23 NOTE — Progress Notes (Signed)
Patient ID: Robert Ritter, male   DOB: 04/23/1920, 80 y.o.   MRN: [id number]              PROGRESS NOTE  DATE: 10/23/2013   FACILITY: [facility]  LEVEL OF CARE: SNF (31)  Acutee Visit  CHIEF COMPLAINT:  Manage  HISTORY OF PRESENT ILLNESS: This is a 80 year old male who is for discharge home with Home health PT, OT and Nursing. DME: Rolling walker. 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 Patient was admitted to this facility for short-term rehabilitation after the patient's recent hospitalization.  Patient has completed SNF rehabilitation and therapy has cleared the patient for discharge.  Reassessment of ongoing problem(s):  HTN: Pt 's HTN remains stable.  Denies CP, sob, DOE, pedal edema, headaches, dizziness or visual disturbances.  No complications from the medications currently being used.  Last BP : 132/50  CAD: The angina has been stable. The patient denies dyspnea on exertion, orthopnea, pedal edema, palpitations and paroxysmal nocturnal dyspnea. No complications noted from the medication presently being used.  ANEMIA: The anemia has been stable. The patient denies fatigue, melena or hematochezia. No complications from the medications currently being used.  PAST MEDICAL HISTORY : Reviewed.  No changes/see problem list  CURRENT MEDICATIONS: Reviewed per MAR/see medication list  REVIEW OF SYSTEMS:  GENERAL: no change in appetite, no fatigue, no weight changes, no fever, chills or weakness RESPIRATORY: no cough, SOB, DOE, wheezing, hemoptysis CARDIAC: no chest pain, edema or palpitations GI: no abdominal pain, diarrhea, constipation, heart burn, nausea or vomiting  PHYSICAL EXAMINATION  GENERAL: no acute distress, normal body habitus EYES: conjunctivae normal, sclerae normal, normal eye lids NECK: supple, trachea midline, no neck masses, no thyroid  tenderness, no thyromegaly LYMPHATICS: no LAN in the neck, no supraclavicular LAN RESPIRATORY: breathing is even & unlabored, BS CTAB CARDIAC: RRR, no extra heart sounds, no edema GI: abdomen soft, normal BS, no masses, no tenderness, no hepatomegaly, no splenomegaly PSYCHIATRIC: the patient is alert & oriented to person, affect & behavior appropriate  LABS/RADIOLOGY: 10/17/13 uric acid 6.6 10/16/13 WBC 8.9 hemoglobin 10.1 hematocrit 33.6 sodium 134 potassium 4.9 glucose 113 BUN 34 creatinine 1.5 calcium 7.9 Labs reviewed: Basic Metabolic Panel:  Recent Labs  10/07/13 1110 10/08/13 1245  10/09/13 0525 10/10/13 1005 10/14/13 0445  NA 141 142  --  140 134* 138  K 4.1 4.0  --  4.5 4.3 4.6  CL 102 106  --  105 99 100  CO2 23 24  --  24 23 25   GLUCOSE 100* 92  --  147* 118* 123*  BUN 24* 27*  --  22 21 29*  CREATININE 1.36* 1.44*  < > 1.48* 1.40* 1.53*  CALCIUM 9.0 8.2*  --  8.4 8.2* 8.4  MG  --  2.0  --   --   --   --   < > = values in this interval not displayed. 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  Patient will receive home health PT, OT and Nursing.  DME provided: Rolling  walker  Total discharge time: Greater than 30 minutes Discharge time involved coordination of the discharge process with Education officer, museum, nursing staff and therapy department. Medical justification for home health services/DME verified.  CPT CODE: 65784  Seth Bake - NP [facility] [phone]

## 2013-10-28 DIAGNOSIS — I2510 Atherosclerotic heart disease of native coronary artery without angina pectoris: Secondary | ICD-10-CM

## 2013-10-28 DIAGNOSIS — H543 Unqualified visual loss, both eyes: Secondary | ICD-10-CM

## 2013-10-28 DIAGNOSIS — R269 Unspecified abnormalities of gait and mobility: Secondary | ICD-10-CM

## 2013-10-28 DIAGNOSIS — Z48812 Encounter for surgical aftercare following surgery on the circulatory system: Secondary | ICD-10-CM

## 2013-10-28 DIAGNOSIS — M6281 Muscle weakness (generalized): Secondary | ICD-10-CM

## 2013-11-06 ENCOUNTER — Other Ambulatory Visit: Payer: Self-pay | Admitting: Internal Medicine

## 2013-11-11 ENCOUNTER — Telehealth: Payer: Self-pay | Admitting: *Deleted

## 2013-11-11 NOTE — Telephone Encounter (Signed)
Donalda Ewings from [facility] called requesting [facility] PT for pt.  Please advise

## 2013-11-11 NOTE — Telephone Encounter (Signed)
Ok Thx 

## 2013-11-12 NOTE — Telephone Encounter (Signed)
Left detailed message on Robert Ritter's VM

## 2013-11-19 ENCOUNTER — Encounter: Payer: Self-pay | Admitting: Vascular Surgery

## 2013-11-20 ENCOUNTER — Encounter: Payer: Self-pay | Admitting: Vascular Surgery

## 2013-11-20 ENCOUNTER — Ambulatory Visit (INDEPENDENT_AMBULATORY_CARE_PROVIDER_SITE_OTHER): Payer: Self-pay | Admitting: Vascular Surgery

## 2013-11-20 ENCOUNTER — Ambulatory Visit
Admission: RE | Admit: 2013-11-20 | Discharge: 2013-11-20 | Disposition: A | Discharge status: Home / Self Care | Payer: Medicare Other | Source: Ambulatory Visit | Attending: Vascular Surgery | Admitting: Vascular Surgery

## 2013-11-20 VITALS — BP 174/61 | HR 88 | Resp 16 | Ht 70.0 in | Wt 153.9 lb

## 2013-11-20 DIAGNOSIS — I714 Abdominal aortic aneurysm, without rupture, unspecified: Secondary | ICD-10-CM

## 2013-11-20 DIAGNOSIS — Z48812 Encounter for surgical aftercare following surgery on the circulatory system: Secondary | ICD-10-CM

## 2013-11-20 MED ORDER — IOHEXOL 350 MG/ML SOLN
60.0000 mL | Freq: Once | INTRAVENOUS | Status: AC | PRN
  Administered 2013-11-20: 60 mL via INTRAVENOUS

## 2013-11-20 NOTE — Progress Notes (Signed)
Patient is a 80 year old male who returns for followup today. He underwent placement of a Gore Excluder aneurysm stent graft on 10/08/2013. 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 The patient's hospital stay was complicated by urinary retention from prostatic hyperplasia. He has since seen [facility] for this. He states his urinary symptoms are improved. He denies abdominal or back pain. He still has some occasional pain in his right groin. However he also has an inguinal hernia on this side.  Review of systems: He has shortness of breath with exertion. He denies chest pain.  Physical exam:  Filed Vitals:   11/20/13 1229  BP: 174/61  Pulse: 88  Resp: 16  Height: 5\' 10"  (1.778 m)  Weight: 153 lb 14.4 oz (69.809 kg)    Extremities: 2+ femoral pulses bilaterally, no pulsatile abdominal mass  Data: The patient is a CT angiogram the abdomen and pelvis today. I reviewed these images. He does have a type II endoleak which appears to most likely be coming from the inferior mesenteric artery. He is no type I endoleak. Aneurysm diameter on a measurement today was 5.4 cm.  Assessment: Well positioned aneurysm stent graft with type II endoleak. I discussed with the family today that type II endoleak usually had a benign course but would need to have continued followup.  Plan: Followup CT angiogram in 5 months.  Ruta Hinds, MD [facility] Office: [phone] Pager: [phone]

## 2014-02-17 ENCOUNTER — Encounter (HOSPITAL_COMMUNITY)
Admission: RE | Admit: 2014-02-17 | Discharge: 2014-02-17 | Disposition: A | Discharge status: Home / Self Care | Payer: Medicare Other | Source: Ambulatory Visit | Attending: Nephrology | Admitting: Nephrology

## 2014-02-17 DIAGNOSIS — D631 Anemia in chronic kidney disease: Secondary | ICD-10-CM | POA: Insufficient documentation

## 2014-02-17 DIAGNOSIS — N183 Chronic kidney disease, stage 3 unspecified: Secondary | ICD-10-CM | POA: Insufficient documentation

## 2014-02-17 DIAGNOSIS — I129 Hypertensive chronic kidney disease with stage 1 through stage 4 chronic kidney disease, or unspecified chronic kidney disease: Secondary | ICD-10-CM | POA: Diagnosis present

## 2014-02-17 DIAGNOSIS — N039 Chronic nephritic syndrome with unspecified morphologic changes: Principal | ICD-10-CM

## 2014-02-17 LAB — IRON AND TIBC
IRON: 49 ug/dL (ref 42–135)
Saturation Ratios: 20 % (ref 20–55)
TIBC: 245 ug/dL (ref 215–435)
UIBC: 196 ug/dL (ref 125–400)

## 2014-02-17 LAB — FERRITIN: FERRITIN: 142 ng/mL (ref 22–322)

## 2014-02-17 LAB — POCT HEMOGLOBIN-HEMACUE: Hemoglobin: 9.7 g/dL — ABNORMAL LOW (ref 13.0–17.0)

## 2014-02-17 MED ORDER — EPOETIN ALFA 20000 UNIT/ML IJ SOLN
INTRAMUSCULAR | Status: AC
  Administered 2014-02-17: 20000 [IU] via SUBCUTANEOUS
  Filled 2014-02-17: qty 1

## 2014-02-17 MED ORDER — EPOETIN ALFA 20000 UNIT/ML IJ SOLN
20000.0000 [IU] | INTRAMUSCULAR | Status: DC
  Administered 2014-02-17: 20000 [IU] via SUBCUTANEOUS

## 2014-02-18 ENCOUNTER — Encounter: Payer: Self-pay | Admitting: Internal Medicine

## 2014-02-18 ENCOUNTER — Ambulatory Visit (INDEPENDENT_AMBULATORY_CARE_PROVIDER_SITE_OTHER): Payer: Medicare Other | Admitting: Internal Medicine

## 2014-02-18 VITALS — BP 136/82 | HR 64 | Temp 97.9°F | Resp 16 | Wt 151.0 lb

## 2014-02-18 DIAGNOSIS — M542 Cervicalgia: Secondary | ICD-10-CM | POA: Insufficient documentation

## 2014-02-18 DIAGNOSIS — Z23 Encounter for immunization: Secondary | ICD-10-CM

## 2014-02-18 DIAGNOSIS — I714 Abdominal aortic aneurysm, without rupture, unspecified: Secondary | ICD-10-CM

## 2014-02-18 DIAGNOSIS — I2510 Atherosclerotic heart disease of native coronary artery without angina pectoris: Secondary | ICD-10-CM

## 2014-02-18 DIAGNOSIS — I10 Essential (primary) hypertension: Secondary | ICD-10-CM

## 2014-02-18 DIAGNOSIS — E785 Hyperlipidemia, unspecified: Secondary | ICD-10-CM

## 2014-02-18 NOTE — Progress Notes (Signed)
   Subjective:    HPI  The patient presents for a follow-up of  chronic hypertension, chronic dyslipidemia, gout controlled with medicines. Doing well, weak since 4/15  F/u AAA, BPH  He underwent placement of a Gore Excluder aneurysm stent graft on 10/08/2013. 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     BP Readings from Last 3 Encounters:  02/18/14 136/82  02/17/14 165/61  11/20/13 174/61   Wt Readings from Last 3 Encounters:  02/18/14 151 lb (68.493 kg)  11/20/13 153 lb 14.4 oz (69.809 kg)  10/23/13 150 lb (68.04 kg)   Review of Systems  Constitutional: Negative for appetite change, fatigue and unexpected weight change.  HENT: Negative for congestion, nosebleeds, sneezing, sore throat and trouble swallowing.   Eyes: Negative for itching and visual disturbance.  Respiratory: Negative for cough.   Cardiovascular: Negative for chest pain, palpitations and leg swelling.  Gastrointestinal: Negative for nausea, diarrhea, blood in stool and abdominal distention.  Genitourinary: Negative for frequency and hematuria.  Musculoskeletal: Positive for arthralgias, gait problem and joint swelling. Negative for back pain and neck pain.       Stiff  Skin: Negative for rash.  Neurological: Negative for dizziness, tremors, speech difficulty and weakness.  Psychiatric/Behavioral: Negative for suicidal ideas, sleep disturbance, dysphoric mood and agitation. The patient is not nervous/anxious.        Objective:   Physical Exam  Constitutional: He is oriented to person, place, and time. He appears well-developed.  HENT:  Mouth/Throat: Oropharynx is clear and moist. No oropharyngeal exudate.  Eyes: Conjunctivae are normal. Pupils are equal, round, and reactive to light.  Neck: Normal range of motion. No JVD present. No thyromegaly present.  Cardiovascular: Normal rate, regular rhythm, normal heart sounds and intact distal pulses.  Exam  reveals no gallop and no friction rub.   No murmur heard. Pulmonary/Chest: Effort normal and breath sounds normal. No respiratory distress. He has no wheezes. He has no rales. He exhibits no tenderness.  Abdominal: Soft. Bowel sounds are normal. He exhibits no distension and no mass. There is no tenderness. There is no rebound and no guarding.  Musculoskeletal: He exhibits edema. He exhibits no tenderness.  ROM is decreased Trace edema B  Lymphadenopathy:    He has cervical adenopathy.  Neurological: He is alert and oriented to person, place, and time. He has normal reflexes. No cranial nerve deficit. He exhibits normal muscle tone. Coordination normal.  Skin: Skin is warm and dry. No rash noted.  Nose and L cheek growth  Psychiatric: He has a normal mood and affect. His behavior is normal. Judgment and thought content normal.   Str leg elev is neg B    . Lab Results  Component Value Date   WBC 7.8 10/14/2013   HGB 9.7* 02/17/2014   HCT 30.1* 10/14/2013   PLT 146* 10/14/2013   GLUCOSE 123* 10/14/2013   CHOL 91 02/18/2013   TRIG 59.0 02/18/2013   HDL 39.90 02/18/2013   LDLCALC 39 02/18/2013   ALT 11 10/07/2013   AST 18 10/07/2013   NA 138 10/14/2013   K 4.6 10/14/2013   CL 100 10/14/2013   CREATININE 1.53* 10/14/2013   BUN 29* 10/14/2013   CO2 25 10/14/2013   TSH 1.80 06/16/2011   PSA 13.73* 06/16/2011   INR 1.16 10/08/2013         Assessment & Plan:

## 2014-02-18 NOTE — Assessment & Plan Note (Signed)
Continue with current prescription therapy as reflected on the Med list.  

## 2014-02-18 NOTE — Assessment & Plan Note (Signed)
Chronic  OA/MSK Tylenol prn 

## 2014-02-18 NOTE — Assessment & Plan Note (Signed)
He underwent placement of a Gore Excluder aneurysm stent graft on 10/08/2013. 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  Continue with current prescription therapy as reflected on the Med list.

## 2014-02-18 NOTE — Progress Notes (Signed)
Pre visit review using our clinic review tool, if applicable. No additional management support is needed unless otherwise documented below in the visit note. 

## 2014-02-18 NOTE — Assessment & Plan Note (Signed)
Continue with current prescription therapy as reflected on the Med list. No angina

## 2014-03-16 ENCOUNTER — Other Ambulatory Visit (HOSPITAL_COMMUNITY): Payer: Self-pay | Admitting: *Deleted

## 2014-03-17 ENCOUNTER — Encounter (HOSPITAL_COMMUNITY)
Admission: RE | Admit: 2014-03-17 | Discharge: 2014-03-17 | Disposition: A | Discharge status: Home / Self Care | Payer: Medicare Other | Source: Ambulatory Visit | Attending: Nephrology | Admitting: Nephrology

## 2014-03-17 DIAGNOSIS — D631 Anemia in chronic kidney disease: Secondary | ICD-10-CM | POA: Insufficient documentation

## 2014-03-17 DIAGNOSIS — I129 Hypertensive chronic kidney disease with stage 1 through stage 4 chronic kidney disease, or unspecified chronic kidney disease: Secondary | ICD-10-CM | POA: Diagnosis present

## 2014-03-17 DIAGNOSIS — N039 Chronic nephritic syndrome with unspecified morphologic changes: Principal | ICD-10-CM

## 2014-03-17 DIAGNOSIS — N183 Chronic kidney disease, stage 3 unspecified: Secondary | ICD-10-CM | POA: Diagnosis present

## 2014-03-17 LAB — POCT HEMOGLOBIN-HEMACUE: HEMOGLOBIN: 10.4 g/dL — AB (ref 13.0–17.0)

## 2014-03-17 MED ORDER — EPOETIN ALFA 20000 UNIT/ML IJ SOLN
20000.0000 [IU] | INTRAMUSCULAR | Status: DC
  Administered 2014-03-17: 20000 [IU] via SUBCUTANEOUS

## 2014-03-17 MED ORDER — SODIUM CHLORIDE 0.9 % IV SOLN
1020.0000 mg | Freq: Once | INTRAVENOUS | Status: AC
  Administered 2014-03-17: 1020 mg via INTRAVENOUS
  Filled 2014-03-17: qty 34

## 2014-03-17 MED ORDER — EPOETIN ALFA 20000 UNIT/ML IJ SOLN
INTRAMUSCULAR | Status: AC
  Administered 2014-03-17: 20000 [IU] via SUBCUTANEOUS
  Filled 2014-03-17: qty 1

## 2014-03-18 ENCOUNTER — Ambulatory Visit (INDEPENDENT_AMBULATORY_CARE_PROVIDER_SITE_OTHER): Payer: Medicare Other | Admitting: Cardiovascular Disease

## 2014-03-18 ENCOUNTER — Encounter: Payer: Self-pay | Admitting: Cardiovascular Disease

## 2014-03-18 VITALS — BP 136/58 | HR 62 | Ht 70.0 in | Wt 149.8 lb

## 2014-03-18 DIAGNOSIS — E785 Hyperlipidemia, unspecified: Secondary | ICD-10-CM

## 2014-03-18 DIAGNOSIS — I2510 Atherosclerotic heart disease of native coronary artery without angina pectoris: Secondary | ICD-10-CM

## 2014-03-18 MED ORDER — SIMVASTATIN 20 MG PO TABS: 20.0000 mg | ORAL_TABLET | Freq: Every day | ORAL | Status: AC

## 2014-03-18 NOTE — Progress Notes (Signed)
HPI:   80 year old gentleman presenting for followup evaluation. He underwent EVAR for treatment of an asymptomatic infrarenal AAA in April 2015 by Dr Oneida Alar. He has a type II endoleak with plans for surveillance imaging. The patient is also followed for coronary artery disease with history of remote CABG.  The patient is doing well. His biggest problem is gait instability. This is been a chronic issue. He is iron deficient and has been receiving iron infusions. He has some generalized fatigue. He's had no recent falls. He denies chest pain or shortness of breath. He and his wife of 87 years are planning a trip to the beach next week.  Outpatient Encounter Prescriptions as of 03/18/2014  Medication Sig  . aspirin 325 MG tablet Take 162.5 mg by mouth daily.  . bimatoprost (LUMIGAN) 0.03 % ophthalmic drops Place 1 drop into both eyes at bedtime.    . celecoxib (CELEBREX) 200 MG capsule Take 200 mg by mouth daily as needed for mild pain or moderate pain.   Marland Kitchen epoetin alfa (EPOGEN,PROCRIT) 26712 UNIT/ML injection Inject 20,000 Units into the skin every 28 (twenty-eight) days.  . fish oil-omega-3 fatty acids 1000 MG capsule Take 1 g by mouth daily.   . furosemide (LASIX) 80 MG tablet TAKE 1/2 TABLET BY MOUTH TWICE A DAY  . nitroGLYCERIN (NITROSTAT) 0.4 MG SL tablet Place 0.4 mg under the tongue as needed for chest pain. Chest pain  . polyethylene glycol (MIRALAX / GLYCOLAX) packet Take 17 g by mouth daily as needed.  . simvastatin (ZOCOR) 20 MG tablet Take 20 mg by mouth daily.  Marland Kitchen telmisartan (MICARDIS) 40 MG tablet Take 40 mg by mouth daily as needed (hypertension).  . terazosin (HYTRIN) 5 MG capsule Take 5 mg by mouth at bedtime.  . colchicine 0.6 MG tablet Take 1 tablet (0.6 mg total) by mouth every other day.    Allergies  Allergen Reactions  . Sulfa Antibiotics Rash    Past Medical History  Diagnosis Date  . CAD (coronary artery disease)     S/P CABG and multiple PCI's  .  Hyperlipidemia   . HTN (hypertension)   . AAA (abdominal aortic aneurysm)     5 cm on 2011 assessment  . Gout 2009  . Elevated PSA     Dr Rosana Hoes  . Myocardial infarction   . Renal insufficiency   . Glaucoma   . Central retinal vein occlusion of left eye   . Skin cancer of face   . Ulcer 07/23/2012    tongue  . Anemia     normocytic  . Myocardial infarction     BP 136/58  Pulse 62  Ht 5\' 10"  (1.778 m)  Wt 149 lb 12.8 oz (67.949 kg)  BMI 21.49 kg/m2  PHYSICAL EXAM: Pt is alert and oriented, pleasant elderly male in NAD HEENT: normal Neck: JVP - normal, carotids 2+= without bruits Lungs: CTA bilaterally CV: RRR without murmur or gallop Abd: soft, NT, Positive BS, no hepatomegaly Ext: no C/C/E, distal pulses intact and equal Skin: warm/dry no rash  EKG:  Normal sinus rhythm 62 beats per minute, age-indeterminate septal infarct, nonspecific ST abnormality  2-D echocardiogram: Study Conclusions  - Left ventricle: The cavity size was normal. There was mild concentric hypertrophy. Systolic function was vigorous. The estimated ejection fraction was in the range of 65% to 70%. Wall motion was normal; there were no regional wall motion abnormalities. Doppler parameters are consistent with abnormal left ventricular relaxation (grade 1 diastolic  dysfunction). Doppler parameters are consistent with both elevated ventricular end-diastolic filling pressure and elevated left atrial filling pressure. - Aortic valve: Trileaflet; mildly thickened, mildly calcified leaflets. Transvalvular velocity was within the normal range. There was no stenosis. Moderate regurgitation. - Mitral valve: Calcified annulus predominantly posterior and involving posterior mitral valve leaflet. Transvalvular velocity was within the normal range. There was no evidence for stenosis. Mild regurgitation. - Left atrium: The atrium was moderately dilated. - Right ventricle: Systolic function was normal. -  Pulmonic valve: Trivial regurgitation. - Pulmonary arteries: Systolic pressure was within the normal range. - Inferior vena cava: The vessel was normal in size; the respirophasic diameter changes were in the normal range (= 50%); findings are consistent with normal central venous pressure.  ASSESSMENT AND PLAN: 1. Coronary artery disease, native vessel, without angina. He will continue on his current medical program which includes aspirin for antiplatelet therapy and a statin drug.  2. Hypertension. Blood pressure is well controlled on a combination of Micardis and furosemide.  3. Abdominal aortic aneurysm, now status post EVAR. Followed by Dr Oneida Alar.  Sherren Mocha MD 03/18/2014 2:10 PM

## 2014-03-18 NOTE — Patient Instructions (Signed)
Your physician recommends that you continue on your current medications as directed. Please refer to the Current Medication list given to you today.  Your physician wants you to follow-up in: 6 months with Dr. Cooper.  You will receive a reminder letter in the mail two months in advance. If you don't receive a letter, please call our office to schedule the follow-up appointment.  

## 2014-04-14 ENCOUNTER — Encounter (HOSPITAL_COMMUNITY): Payer: Medicare Other

## 2014-04-21 ENCOUNTER — Other Ambulatory Visit: Payer: Self-pay | Admitting: Vascular Surgery

## 2014-04-21 ENCOUNTER — Encounter (HOSPITAL_COMMUNITY)
Admission: RE | Admit: 2014-04-21 | Discharge: 2014-04-21 | Disposition: A | Discharge status: Home / Self Care | Payer: Medicare Other | Source: Ambulatory Visit | Attending: Nephrology | Admitting: Nephrology

## 2014-04-21 DIAGNOSIS — N183 Chronic kidney disease, stage 3 (moderate): Secondary | ICD-10-CM | POA: Insufficient documentation

## 2014-04-21 DIAGNOSIS — D631 Anemia in chronic kidney disease: Secondary | ICD-10-CM | POA: Diagnosis not present

## 2014-04-21 LAB — POCT HEMOGLOBIN-HEMACUE: Hemoglobin: 10 g/dL — ABNORMAL LOW (ref 13.0–17.0)

## 2014-04-21 LAB — FERRITIN: FERRITIN: 405 ng/mL — AB (ref 22–322)

## 2014-04-21 LAB — IRON AND TIBC
IRON: 39 ug/dL — AB (ref 42–135)
Saturation Ratios: 21 % (ref 20–55)
TIBC: 182 ug/dL — ABNORMAL LOW (ref 215–435)
UIBC: 143 ug/dL (ref 125–400)

## 2014-04-21 LAB — CREATININE, SERUM: Creat: 1.63 mg/dL — ABNORMAL HIGH (ref 0.50–1.35)

## 2014-04-21 LAB — BUN: BUN: 24 mg/dL — ABNORMAL HIGH (ref 6–23)

## 2014-04-21 MED ORDER — EPOETIN ALFA 20000 UNIT/ML IJ SOLN
20000.0000 [IU] | INTRAMUSCULAR | Status: DC
  Administered 2014-04-21: 20000 [IU] via SUBCUTANEOUS

## 2014-04-21 MED ORDER — EPOETIN ALFA 20000 UNIT/ML IJ SOLN
INTRAMUSCULAR | Status: AC
  Filled 2014-04-21: qty 1

## 2014-04-22 ENCOUNTER — Encounter: Payer: Self-pay | Admitting: Vascular Surgery

## 2014-04-23 ENCOUNTER — Ambulatory Visit (INDEPENDENT_AMBULATORY_CARE_PROVIDER_SITE_OTHER): Payer: Medicare Other | Admitting: Vascular Surgery

## 2014-04-23 ENCOUNTER — Encounter: Payer: Self-pay | Admitting: Vascular Surgery

## 2014-04-23 ENCOUNTER — Ambulatory Visit
Admission: RE | Admit: 2014-04-23 | Discharge: 2014-04-23 | Disposition: A | Discharge status: Home / Self Care | Payer: Medicare Other | Source: Ambulatory Visit | Attending: Vascular Surgery | Admitting: Vascular Surgery

## 2014-04-23 VITALS — BP 157/59 | HR 72 | Temp 97.9°F | Resp 14 | Ht 70.0 in | Wt 159.4 lb

## 2014-04-23 DIAGNOSIS — I2510 Atherosclerotic heart disease of native coronary artery without angina pectoris: Secondary | ICD-10-CM

## 2014-04-23 DIAGNOSIS — I714 Abdominal aortic aneurysm, without rupture, unspecified: Secondary | ICD-10-CM

## 2014-04-23 DIAGNOSIS — Z48812 Encounter for surgical aftercare following surgery on the circulatory system: Secondary | ICD-10-CM

## 2014-04-23 MED ORDER — IOHEXOL 350 MG/ML SOLN
60.0000 mL | Freq: Once | INTRAVENOUS | Status: AC | PRN
  Administered 2014-04-23: 60 mL via INTRAVENOUS

## 2014-04-23 NOTE — Progress Notes (Signed)
Patient is a 80 year old male who returns for followup today. He underwent placement of a Gore Excluder aneurysm stent graft on 10/08/2013. 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 The patient's hospital stay was complicated by urinary retention from prostatic hyperplasia. He has since seen [facility] for this. He states his urinary symptoms are improved. He denies abdominal or back pain.   Review of systems: He has shortness of breath with exertion. He denies chest pain.  Physical exam:     Filed Vitals:   04/23/14 1235  BP: 157/59  Pulse: 72  Temp: 97.9 F (36.6 C)  TempSrc: Oral  Resp: 14  Height: 5\' 10"  (1.778 m)  Weight: 159 lb 6.4 oz (72.303 kg)  SpO2: 98%    Extremities: 2+ femoral pulses bilaterally, no pulsatile abdominal mass  Data: The patient is a CT angiogram the abdomen and pelvis today. I reviewed these images. He does have a type II endoleak which appears to most likely be coming from the inferior mesenteric artery. He is no type I endoleak. Aneurysm diameter on a measurement today was 5.8 cm which is slightly increased from 5.47 m preoperatively.  Assessment: Well positioned aneurysm stent graft with type II endoleak. I discussed with the family today that type II endoleak usually had a benign course but would need to have continued followup.  Plan: Followup CT angiogram in 6 months.  Ruta Hinds, MD [facility] Office: [phone] Pager: [phone]

## 2014-04-24 NOTE — Addendum Note (Signed)
Addended by: Mena Goes on: 04/24/2014 10:50 AM   Modules accepted: Orders

## 2014-05-05 ENCOUNTER — Encounter (HOSPITAL_COMMUNITY): Payer: Self-pay | Admitting: Emergency Medicine

## 2014-05-05 ENCOUNTER — Emergency Department (HOSPITAL_COMMUNITY): Payer: Medicare Other

## 2014-05-05 ENCOUNTER — Inpatient Hospital Stay (HOSPITAL_COMMUNITY)
Admission: EM | Admit: 2014-05-05 | Discharge: 2014-05-07 | DRG: 312 | Disposition: A | Discharge status: Home / Self Care | Payer: Medicare Other | Attending: Internal Medicine | Admitting: Internal Medicine

## 2014-05-05 DIAGNOSIS — Z87891 Personal history of nicotine dependence: Secondary | ICD-10-CM | POA: Diagnosis not present

## 2014-05-05 DIAGNOSIS — I714 Abdominal aortic aneurysm, without rupture: Secondary | ICD-10-CM | POA: Diagnosis not present

## 2014-05-05 DIAGNOSIS — M109 Gout, unspecified: Secondary | ICD-10-CM | POA: Diagnosis not present

## 2014-05-05 DIAGNOSIS — Z8249 Family history of ischemic heart disease and other diseases of the circulatory system: Secondary | ICD-10-CM | POA: Diagnosis not present

## 2014-05-05 DIAGNOSIS — Z951 Presence of aortocoronary bypass graft: Secondary | ICD-10-CM | POA: Diagnosis not present

## 2014-05-05 DIAGNOSIS — E785 Hyperlipidemia, unspecified: Secondary | ICD-10-CM | POA: Diagnosis not present

## 2014-05-05 DIAGNOSIS — I129 Hypertensive chronic kidney disease with stage 1 through stage 4 chronic kidney disease, or unspecified chronic kidney disease: Secondary | ICD-10-CM | POA: Diagnosis present

## 2014-05-05 DIAGNOSIS — I252 Old myocardial infarction: Secondary | ICD-10-CM

## 2014-05-05 DIAGNOSIS — I2510 Atherosclerotic heart disease of native coronary artery without angina pectoris: Secondary | ICD-10-CM | POA: Diagnosis present

## 2014-05-05 DIAGNOSIS — N179 Acute kidney failure, unspecified: Secondary | ICD-10-CM | POA: Diagnosis not present

## 2014-05-05 DIAGNOSIS — D649 Anemia, unspecified: Secondary | ICD-10-CM | POA: Diagnosis present

## 2014-05-05 DIAGNOSIS — N400 Enlarged prostate without lower urinary tract symptoms: Secondary | ICD-10-CM | POA: Diagnosis not present

## 2014-05-05 DIAGNOSIS — E860 Dehydration: Secondary | ICD-10-CM | POA: Diagnosis present

## 2014-05-05 DIAGNOSIS — I25810 Atherosclerosis of coronary artery bypass graft(s) without angina pectoris: Secondary | ICD-10-CM | POA: Diagnosis present

## 2014-05-05 DIAGNOSIS — I10 Essential (primary) hypertension: Secondary | ICD-10-CM | POA: Diagnosis present

## 2014-05-05 DIAGNOSIS — W1830XA Fall on same level, unspecified, initial encounter: Secondary | ICD-10-CM | POA: Diagnosis not present

## 2014-05-05 DIAGNOSIS — N189 Chronic kidney disease, unspecified: Secondary | ICD-10-CM

## 2014-05-05 DIAGNOSIS — S0990XA Unspecified injury of head, initial encounter: Secondary | ICD-10-CM | POA: Diagnosis not present

## 2014-05-05 DIAGNOSIS — H409 Unspecified glaucoma: Secondary | ICD-10-CM | POA: Diagnosis not present

## 2014-05-05 DIAGNOSIS — W19XXXA Unspecified fall, initial encounter: Secondary | ICD-10-CM | POA: Diagnosis present

## 2014-05-05 DIAGNOSIS — N183 Chronic kidney disease, stage 3 (moderate): Secondary | ICD-10-CM | POA: Diagnosis present

## 2014-05-05 DIAGNOSIS — R55 Syncope and collapse: Secondary | ICD-10-CM | POA: Diagnosis not present

## 2014-05-05 DIAGNOSIS — Z7982 Long term (current) use of aspirin: Secondary | ICD-10-CM | POA: Diagnosis not present

## 2014-05-05 DIAGNOSIS — Z79899 Other long term (current) drug therapy: Secondary | ICD-10-CM | POA: Diagnosis not present

## 2014-05-05 LAB — CBC WITH DIFFERENTIAL/PLATELET
Basophils Absolute: 0 10*3/uL (ref 0.0–0.1)
Basophils Relative: 1 % (ref 0–1)
EOS ABS: 0.4 10*3/uL (ref 0.0–0.7)
Eosinophils Relative: 6 % — ABNORMAL HIGH (ref 0–5)
HEMATOCRIT: 33.4 % — AB (ref 39.0–52.0)
HEMOGLOBIN: 10.6 g/dL — AB (ref 13.0–17.0)
Lymphocytes Relative: 10 % — ABNORMAL LOW (ref 12–46)
Lymphs Abs: 0.6 10*3/uL — ABNORMAL LOW (ref 0.7–4.0)
MCH: 29.1 pg (ref 26.0–34.0)
MCHC: 31.7 g/dL (ref 30.0–36.0)
MCV: 91.8 fL (ref 78.0–100.0)
MONO ABS: 0.6 10*3/uL (ref 0.1–1.0)
MONOS PCT: 9 % (ref 3–12)
Neutro Abs: 4.8 10*3/uL (ref 1.7–7.7)
Neutrophils Relative %: 74 % (ref 43–77)
Platelets: 171 10*3/uL (ref 150–400)
RBC: 3.64 MIL/uL — AB (ref 4.22–5.81)
RDW: 14.6 % (ref 11.5–15.5)
WBC: 6.4 10*3/uL (ref 4.0–10.5)

## 2014-05-05 LAB — COMPREHENSIVE METABOLIC PANEL
ALT: 9 U/L (ref 0–53)
ANION GAP: 14 (ref 5–15)
AST: 16 U/L (ref 0–37)
Albumin: 3.3 g/dL — ABNORMAL LOW (ref 3.5–5.2)
Alkaline Phosphatase: 72 U/L (ref 39–117)
BUN: 31 mg/dL — AB (ref 6–23)
CO2: 24 mEq/L (ref 19–32)
CREATININE: 2 mg/dL — AB (ref 0.50–1.35)
Calcium: 8.8 mg/dL (ref 8.4–10.5)
Chloride: 102 mEq/L (ref 96–112)
GFR calc non Af Amer: 27 mL/min — ABNORMAL LOW (ref >90)
GFR, EST AFRICAN AMERICAN: 32 mL/min — AB (ref >90)
Glucose, Bld: 113 mg/dL — ABNORMAL HIGH (ref 70–99)
POTASSIUM: 4.4 meq/L (ref 3.7–5.3)
Sodium: 140 mEq/L (ref 137–147)
TOTAL PROTEIN: 6.9 g/dL (ref 6.0–8.3)
Total Bilirubin: 0.4 mg/dL (ref 0.3–1.2)

## 2014-05-05 LAB — I-STAT TROPONIN, ED: Troponin i, poc: 0.02 ng/mL (ref 0.00–0.08)

## 2014-05-05 LAB — TSH: TSH: 2.64 u[IU]/mL (ref 0.350–4.500)

## 2014-05-05 MED ORDER — SODIUM CHLORIDE 0.9 % IV SOLN: INTRAVENOUS | Status: DC

## 2014-05-05 MED ORDER — POLYETHYLENE GLYCOL 3350 17 G PO PACK: 17.0000 g | PACK | Freq: Every day | ORAL | Status: DC | PRN

## 2014-05-05 MED ORDER — ACETAMINOPHEN 650 MG RE SUPP
650.0000 mg | Freq: Four times a day (QID) | RECTAL | Status: DC | PRN
Start: 2014-05-05 — End: 2014-05-07

## 2014-05-05 MED ORDER — LATANOPROST 0.005 % OP SOLN
1.0000 [drp] | Freq: Every day | OPHTHALMIC | Status: DC
  Administered 2014-05-05 – 2014-05-06 (×2): 1 [drp] via OPHTHALMIC
  Filled 2014-05-05: qty 2.5

## 2014-05-05 MED ORDER — MORPHINE SULFATE 2 MG/ML IJ SOLN: 1.0000 mg | INTRAMUSCULAR | Status: DC | PRN

## 2014-05-05 MED ORDER — HEPARIN SODIUM (PORCINE) 5000 UNIT/ML IJ SOLN
5000.0000 [IU] | Freq: Three times a day (TID) | INTRAMUSCULAR | Status: DC
  Administered 2014-05-05 – 2014-05-06 (×3): 5000 [IU] via SUBCUTANEOUS
  Filled 2014-05-05 (×4): qty 1

## 2014-05-05 MED ORDER — OMEGA-3 FATTY ACIDS 1000 MG PO CAPS
1.0000 g | ORAL_CAPSULE | Freq: Every day | ORAL | Status: DC
Start: 2014-05-05 — End: 2014-05-05

## 2014-05-05 MED ORDER — ASPIRIN EC 81 MG PO TBEC
162.0000 mg | DELAYED_RELEASE_TABLET | Freq: Every day | ORAL | Status: DC
  Administered 2014-05-05 – 2014-05-07 (×3): 162 mg via ORAL
  Filled 2014-05-05 (×4): qty 2

## 2014-05-05 MED ORDER — SODIUM CHLORIDE 0.9 % IV SOLN
INTRAVENOUS | Status: DC
  Administered 2014-05-05 – 2014-05-06 (×2): via INTRAVENOUS

## 2014-05-05 MED ORDER — TERAZOSIN HCL 5 MG PO CAPS
5.0000 mg | ORAL_CAPSULE | Freq: Every day | ORAL | Status: DC
  Administered 2014-05-05: 5 mg via ORAL
  Filled 2014-05-05 (×2): qty 1

## 2014-05-05 MED ORDER — SODIUM CHLORIDE 0.9 % IV BOLUS (SEPSIS)
500.0000 mL | Freq: Once | INTRAVENOUS | Status: AC
  Administered 2014-05-05: 500 mL via INTRAVENOUS

## 2014-05-05 MED ORDER — OXYCODONE HCL 5 MG PO TABS: 5.0000 mg | ORAL_TABLET | ORAL | Status: DC | PRN

## 2014-05-05 MED ORDER — TETANUS-DIPHTH-ACELL PERTUSSIS 5-2.5-18.5 LF-MCG/0.5 IM SUSP
0.5000 mL | Freq: Once | INTRAMUSCULAR | Status: AC
  Administered 2014-05-05: 0.5 mL via INTRAMUSCULAR
  Filled 2014-05-05: qty 0.5

## 2014-05-05 MED ORDER — OMEGA-3-ACID ETHYL ESTERS 1 G PO CAPS
1.0000 g | ORAL_CAPSULE | Freq: Every day | ORAL | Status: DC
  Administered 2014-05-05 – 2014-05-07 (×3): 1 g via ORAL
  Filled 2014-05-05 (×3): qty 1

## 2014-05-05 MED ORDER — ACETAMINOPHEN 325 MG PO TABS: 650.0000 mg | ORAL_TABLET | Freq: Four times a day (QID) | ORAL | Status: DC | PRN

## 2014-05-05 MED ORDER — ASPIRIN 325 MG PO TABS: 162.5000 mg | ORAL_TABLET | Freq: Every day | ORAL | Status: DC

## 2014-05-05 MED ORDER — SIMVASTATIN 20 MG PO TABS
20.0000 mg | ORAL_TABLET | Freq: Every day | ORAL | Status: DC
  Administered 2014-05-05 – 2014-05-07 (×3): 20 mg via ORAL
  Filled 2014-05-05 (×3): qty 1

## 2014-05-05 NOTE — ED Notes (Signed)
Report attempt x 1, spoke with Larene Beach, RN

## 2014-05-05 NOTE — ED Notes (Addendum)
Pt has hx of orthostatic changes; found unconscious by wife in kitchen. Wife reports passed out for 3-5 mins. Pt has ungoing hypotension issues and has been told by cardiologist to eat salty food when he feels it gets low. Pt was trying to fix some soup at the time.  Lacerations to head and skin tears bilaterally on arms. Hx of 5 stents and AAA. Denies chest pain. States "just weak"; denies pain.

## 2014-05-05 NOTE — ED Provider Notes (Signed)
CSN: [id number]     Arrival date & time 05/05/14  9323 History   First MD Initiated Contact with Patient 05/05/14 [phone]     Chief Complaint  Patient presents with  . Fall  . Loss of Consciousness     (Consider location/radiation/quality/duration/timing/severity/associated sxs/prior Treatment) The history is provided by the patient.  Robert Ritter is a 80 y.o. male hx of CAD s/p CABG and PCIs, AAA s/p repair, HTN here with syncope. He has been feeling lightheaded and dizzy and weak for some time. Today he felt lightheaded and dizzy and felt that his blood pressure is getting lower. He went to microwave some chicken soup but then felt weak and passed out. Found to be unconscious by wife in the kitchen. Denies and fevers chills or chest pain. Denies any abdominal pain and was noted to have a stable endoleak by vascular surgeon in May this year. Not on blood thinners.   Past Medical History  Diagnosis Date  . CAD (coronary artery disease)     S/P CABG and multiple PCI's  . Hyperlipidemia   . HTN (hypertension)   . AAA (abdominal aortic aneurysm)     5 cm on 2011 assessment  . Gout 2009  . Elevated PSA     Dr Rosana Hoes  . Myocardial infarction   . Renal insufficiency   . Glaucoma   . Central retinal vein occlusion of left eye   . Skin cancer of face   . Ulcer 07/23/2012    tongue  . Anemia     normocytic  . Myocardial infarction    Past Surgical History  Procedure Laterality Date  . Coronary artery bypass graft  1981  . Hernia repair    . Inner ear surgery    . Abdominal aortic endovascular stent graft N/A 10/08/2013    Procedure: ABDOMINAL AORTIC ENDOVASCULAR STENT GRAFT;  Surgeon: Elam Dutch, MD;  Location: [facility] OR;  Service: Vascular;  Laterality: N/A;  . Embolization Right 10/08/2013    Procedure: COIL EMBOLIZATION (NESTERS)- RIGHT INTERNAL ILIAC ANEURYSM;  Surgeon: Elam Dutch, MD;  Location: [facility] OR;  Service: Vascular;  Laterality: Right;   Family History  Problem  Relation Age of Onset  . Hypertension Other   . Heart disease Father     before age 43  . Heart attack Father    History  Substance Use Topics  . Smoking status: Former Research scientist (life sciences)  . Smokeless tobacco: Never Used  . Alcohol Use: No    Review of Systems  Cardiovascular: Positive for syncope.  Skin: Positive for wound.  Neurological: Positive for dizziness and syncope.  All other systems reviewed and are negative.     Allergies  Sulfa antibiotics  Home Medications   Prior to Admission medications   Medication Sig Start Date End Date Taking? Authorizing Provider  aspirin 325 MG tablet Take 162.5 mg by mouth daily.   Yes Historical Provider, MD  bimatoprost (LUMIGAN) 0.03 % ophthalmic drops Place 1 drop into both eyes at bedtime.     Yes Historical Provider, MD  epoetin alfa (EPOGEN,PROCRIT) 22025 UNIT/ML injection Inject 20,000 Units into the skin every 28 (twenty-eight) days.   Yes Historical Provider, MD  fish oil-omega-3 fatty acids 1000 MG capsule Take 1 g by mouth daily.    Yes Historical Provider, MD  furosemide (LASIX) 80 MG tablet TAKE 1/2 TABLET BY MOUTH TWICE A DAY   Yes Aleksei Plotnikov V, MD  nitroGLYCERIN (NITROSTAT) 0.4 MG SL tablet Place 0.4  mg under the tongue as needed for chest pain. Chest pain   Yes Historical Provider, MD  psyllium (METAMUCIL) 58.6 % packet Take 1 packet by mouth daily.   Yes Historical Provider, MD  simvastatin (ZOCOR) 20 MG tablet Take 1 tablet (20 mg total) by mouth daily. 03/18/14  Yes Sherren Mocha, MD  telmisartan (MICARDIS) 40 MG tablet Take 40 mg by mouth daily as needed (hypertension).   Yes Historical Provider, MD  terazosin (HYTRIN) 5 MG capsule Take 5 mg by mouth at bedtime.   Yes Historical Provider, MD  celecoxib (CELEBREX) 200 MG capsule Take 200 mg by mouth daily as needed for mild pain or moderate pain.     Historical Provider, MD  colchicine 0.6 MG tablet Take 1 tablet (0.6 mg total) by mouth every other day. 10/16/13 10/23/13   Samantha J Rhyne, PA-C  polyethylene glycol (MIRALAX / GLYCOLAX) packet Take 17 g by mouth daily as needed. Patient not taking: Reported on 05/05/2014 10/14/13   Samantha J Rhyne, PA-C   BP 144/59 mmHg  Pulse 64  Temp(Src) 97.7 F (36.5 C) (Oral)  Resp 16  SpO2 100% Physical Exam  Constitutional: He is oriented to person, place, and time.  Chronically ill, hard of hearing   HENT:  Head: Normocephalic.  Multiple abrasions on forehead. 1 in vertical laceration forehead   Eyes: Conjunctivae are normal. Pupils are equal, round, and reactive to light.  Neck: Normal range of motion. Neck supple.  Cardiovascular: Normal rate and regular rhythm.   Pulmonary/Chest: Effort normal and breath sounds normal. No respiratory distress. He has no wheezes. He has no rales.  Abdominal: Soft. Bowel sounds are normal. He exhibits no distension. There is no tenderness. There is no rebound.  Musculoskeletal: Normal range of motion.  Bilateral elbow skin tears, nl ROM bilateral elbows. No bony tenderness. Pelvis stable, nl ROM bilateral hips   Neurological: He is alert and oriented to person, place, and time. No cranial nerve deficit. Coordination normal.  Skin: Skin is warm and dry.  Psychiatric: He has a normal mood and affect. His behavior is normal. Judgment and thought content normal.  Nursing note and vitals reviewed.   ED Course  Procedures (including critical care time) Labs Review Labs Reviewed  CBC WITH DIFFERENTIAL - Abnormal; Notable for the following:    RBC 3.64 (*)    Hemoglobin 10.6 (*)    HCT 33.4 (*)    Lymphocytes Relative 10 (*)    Lymphs Abs 0.6 (*)    Eosinophils Relative 6 (*)    All other components within normal limits  COMPREHENSIVE METABOLIC PANEL - Abnormal; Notable for the following:    Glucose, Bld 113 (*)    BUN 31 (*)    Creatinine, Ser 2.00 (*)    Albumin 3.3 (*)    GFR calc non Af Amer 27 (*)    GFR calc Af Amer 32 (*)    All other components within normal  limits  I-STAT TROPOININ, ED    Imaging Review Dg Chest 2 View  05/05/2014   CLINICAL DATA:  Pt has hx of orthostatic changes; found unconscious by wife in kitchen. Wife reports passed out for 3-5 mins. Pt has ungoing hypotension issues and has been told by cardiologist to eat salty food when he feels it gets low. Pt was trying to fix some soup at the time. Lacerations to head and skin tears bilaterally on arms. Hx of 5 stents and AAA. Denies chest pain. States "just weak";  denies pain.  EXAM: CHEST  2 VIEW  COMPARISON:  10/08/2013  FINDINGS: Cardiopericardial silhouette is mildly enlarged. Changes from previous cardiac surgery are stable.  No mediastinal or hilar masses or evidence of adenopathy.  Lungs are clear.  No pleural effusion or pneumothorax.  Bony thorax is demineralized but grossly intact.  IMPRESSION: No acute cardiopulmonary disease.   Electronically Signed   By: Lajean Manes M.D.   On: 05/05/2014 11:27   Dg Pelvis 1-2 Views  05/05/2014   CLINICAL DATA:  Pt has hx of orthostatic changes; found unconscious by wife in kitchen. Wife reports passed out for 3-5 mins. Pt has ungoing hypotension issues and has been told by cardiologist to eat salty food when he feels it gets low. Pt was trying to fix some soup at the time. Lacerations to head and skin tears bilaterally on arms. Hx of 5 stents and AAA. Denies chest pain. States "just weak"; denies pain.  EXAM: PELVIS - 1-2 VIEW  COMPARISON:  CT, 04/23/2014  FINDINGS: No fracture. No bone lesion. The hip joints, SI joints and symphysis pubis are normally space and aligned. No significant arthropathic change. The bones are diffusely demineralized.  Aortoiliac vascular stents are stable from the prior CT. A coil mass lies in the right upper pelvis, also unchanged. Vascular calcifications extend along the iliac and femoral arteries.  IMPRESSION: 1. No acute findings.  No fracture or dislocation.   Electronically Signed   By: Lajean Manes M.D.   On:  05/05/2014 11:28   Ct Head Wo Contrast  05/05/2014   CLINICAL DATA:  80 yo, fall, syncope. Head injury. Found unconscious on floor. Loss of consciousness for 3-5 min. Lacerations to head.  EXAM: CT HEAD WITHOUT CONTRAST  CT CERVICAL SPINE WITHOUT CONTRAST  TECHNIQUE: Multidetector CT imaging of the head and cervical spine was performed following the standard protocol without intravenous contrast. Multiplanar CT image reconstructions of the cervical spine were also generated.  COMPARISON:  C-spine CT 09/14/2013  FINDINGS: CT HEAD FINDINGS  There is atrophy and chronic small vessel disease changes. Old bilateral basal ganglia lacunar infarcts. No acute intracranial abnormality. Specifically, no hemorrhage, hydrocephalus, mass lesion, acute infarction, or significant intracranial injury. No acute calvarial abnormality.  Mucosal thickening within the paranasal sinuses. No air-fluid levels.  CT CERVICAL SPINE FINDINGS  Advanced degenerative disc disease throughout the cervical spine. Bilateral degenerative facet disease. Slight levoscoliosis of the cervical spine which could be positional. Prevertebral soft tissues are normal. No epidural or paraspinal hematoma. No fracture.  IMPRESSION: No acute intracranial abnormality.  No acute bony abnormality in the cervical spine   Electronically Signed   By: Rolm Baptise M.D.   On: 05/05/2014 11:50   Ct Cervical Spine Wo Contrast  05/05/2014   CLINICAL DATA:  80 yo, fall, syncope. Head injury. Found unconscious on floor. Loss of consciousness for 3-5 min. Lacerations to head.  EXAM: CT HEAD WITHOUT CONTRAST  CT CERVICAL SPINE WITHOUT CONTRAST  TECHNIQUE: Multidetector CT imaging of the head and cervical spine was performed following the standard protocol without intravenous contrast. Multiplanar CT image reconstructions of the cervical spine were also generated.  COMPARISON:  C-spine CT 09/14/2013  FINDINGS: CT HEAD FINDINGS  There is atrophy and chronic small vessel  disease changes. Old bilateral basal ganglia lacunar infarcts. No acute intracranial abnormality. Specifically, no hemorrhage, hydrocephalus, mass lesion, acute infarction, or significant intracranial injury. No acute calvarial abnormality.  Mucosal thickening within the paranasal sinuses. No air-fluid levels.  CT CERVICAL  SPINE FINDINGS  Advanced degenerative disc disease throughout the cervical spine. Bilateral degenerative facet disease. Slight levoscoliosis of the cervical spine which could be positional. Prevertebral soft tissues are normal. No epidural or paraspinal hematoma. No fracture.  IMPRESSION: No acute intracranial abnormality.  No acute bony abnormality in the cervical spine   Electronically Signed   By: Rolm Baptise M.D.   On: 05/05/2014 11:50     EKG Interpretation   Date/Time:  Tuesday May 05 2014 09:59:57 EST Ventricular Rate:  63 PR Interval:  73 QRS Duration: 97 QT Interval:  472 QTC Calculation: 483 R Axis:   43 Text Interpretation:  Sinus rhythm Short PR interval Probable anteroseptal  infarct, old Repol abnrm suggests ischemia, lateral leads Artifact in  lead(s) I II III aVR aVL aVF V1 V2 V3 V4 V5 V6 poor baseline, gorssly  unchanged since previous  Confirmed by YAO  MD, DAVID ([id number]) on  05/05/2014 10:04:46 AM      MDM   Final diagnoses:  Head injury  Fall    JOSELUIS ALESSIO is a 80 y.o. male here with syncope. Given cardiac risk factors, will need to admit. Will get CT head/neck, xrays.   12:32 PM Cr. 2, elevated compared to baseline. CT head/neck unremarkable. Given IVF. Will admit for syncope on tele.    Wandra Arthurs, MD 05/05/14 [phone]

## 2014-05-05 NOTE — ED Notes (Signed)
Pt undressed, in gown, on monitor, continuous pulse oximetry and blood pressure cuff; family at bedside 

## 2014-05-05 NOTE — ED Notes (Signed)
phelb and family at bedside.

## 2014-05-05 NOTE — H&P (Signed)
[facility] History and Physical  QUILL GRINDER DDU:[id number] DOB: 08-Sep-1919 DOA: 05/05/2014  Referring physician:  PCP: Walker Kehr, MD   Chief Complaint: Passed out and fall  HPI: Robert Ritter is a 80 y.o. male  With past medical history of coronary artery disease, hypertension, chronic kidney disease stage III, chronic diastolic dysfunction, who presented with syncopal episode. Patient states that he felt that his blood pressure dropped, and as he got up, he felt weak and fell to the floor. He denies loss of consciousness.  Patient then got up and sustained some injuries to his head and right arm. He denies any prior episode of fall.  Patient is on and ARB  and Lasix 40mg   at home, which he takes only as needed. He states he took 2 doses of Lasix and the ARB  when he measured his pressure and found it elevated.he denies any current dizziness, lightheadedness, chest pain, shortness of breath.     Review of Systems:  Constitutional:  No weight loss, night sweats, Fevers, chills, fatigue.  HEENT:  No headaches, Difficulty swallowing,Tooth/dental problems,Sore throat,  No sneezing, itching, ear ache, nasal congestion, post nasal drip,  Cardio-vascular:  No chest pain, Orthopnea, PND,mild swelling in lower extremities, anasarca, dizziness, palpitations  GI:  No heartburn, indigestion, abdominal pain, nausea, vomiting, diarrhea, change in bowel habits, loss of appetite  Resp:  No shortness of breath with exertion or at rest. No excess mucus, no productive cough, No non-productive cough, No coughing up of blood.No change in color of mucus.No wheezing.No chest wall deformity  Skin:  no rash or lesions.  GU:  no dysuria, change in color of urine, no urgency or frequency. No flank pain.  Musculoskeletal:  No joint pain or swelling. No decreased range of motion. No back pain.  Psych:  No change in mood or affect. No depression or anxiety. No memory loss.   Past Medical  History  Diagnosis Date  . CAD (coronary artery disease)     S/P CABG and multiple PCI's  . Hyperlipidemia   . HTN (hypertension)   . AAA (abdominal aortic aneurysm)     5 cm on 2011 assessment  . Gout 2009  . Elevated PSA     Dr Rosana Hoes  . Myocardial infarction   . Renal insufficiency   . Glaucoma   . Central retinal vein occlusion of left eye   . Skin cancer of face   . Ulcer 07/23/2012    tongue  . Anemia     normocytic  . Myocardial infarction    Past Surgical History  Procedure Laterality Date  . Coronary artery bypass graft  1981  . Hernia repair    . Inner ear surgery    . Abdominal aortic endovascular stent graft N/A 10/08/2013    Procedure: ABDOMINAL AORTIC ENDOVASCULAR STENT GRAFT;  Surgeon: Elam Dutch, MD;  Location: [facility] OR;  Service: Vascular;  Laterality: N/A;  . Embolization Right 10/08/2013    Procedure: COIL EMBOLIZATION (NESTERS)- RIGHT INTERNAL ILIAC ANEURYSM;  Surgeon: Elam Dutch, MD;  Location: [facility];  Service: Vascular;  Laterality: Right;   Social History:  reports that he has quit smoking. He has never used smokeless tobacco. He reports that he does not drink alcohol or use illicit drugs.  Allergies  Allergen Reactions  . Sulfa Antibiotics Rash    Family History  Problem Relation Age of Onset  . Hypertension Other   . Heart disease Father     before  age 44  . Heart attack Father      Prior to Admission medications   Medication Sig Start Date End Date Taking? Authorizing Provider  aspirin 325 MG tablet Take 162.5 mg by mouth daily.   Yes Historical Provider, MD  bimatoprost (LUMIGAN) 0.03 % ophthalmic drops Place 1 drop into both eyes at bedtime.     Yes Historical Provider, MD  epoetin alfa (EPOGEN,PROCRIT) 17510 UNIT/ML injection Inject 20,000 Units into the skin every 28 (twenty-eight) days.   Yes Historical Provider, MD  fish oil-omega-3 fatty acids 1000 MG capsule Take 1 g by mouth daily.    Yes Historical Provider, MD  furosemide  (LASIX) 80 MG tablet TAKE 1/2 TABLET BY MOUTH TWICE A DAY   Yes Aleksei Plotnikov V, MD  nitroGLYCERIN (NITROSTAT) 0.4 MG SL tablet Place 0.4 mg under the tongue as needed for chest pain. Chest pain   Yes Historical Provider, MD  psyllium (METAMUCIL) 58.6 % packet Take 1 packet by mouth daily.   Yes Historical Provider, MD  simvastatin (ZOCOR) 20 MG tablet Take 1 tablet (20 mg total) by mouth daily. 03/18/14  Yes Sherren Mocha, MD  telmisartan (MICARDIS) 40 MG tablet Take 40 mg by mouth daily as needed (hypertension).   Yes Historical Provider, MD  terazosin (HYTRIN) 5 MG capsule Take 5 mg by mouth at bedtime.   Yes Historical Provider, MD  celecoxib (CELEBREX) 200 MG capsule Take 200 mg by mouth daily as needed for mild pain or moderate pain.     Historical Provider, MD  colchicine 0.6 MG tablet Take 1 tablet (0.6 mg total) by mouth every other day. 10/16/13 10/23/13  Samantha J Rhyne, PA-C  polyethylene glycol (MIRALAX / GLYCOLAX) packet Take 17 g by mouth daily as needed. Patient not taking: Reported on 05/05/2014 10/14/13   Gabriel Earing, PA-C   Physical Exam: Filed Vitals:   05/05/14 0957 05/05/14 1000  BP: 144/59 144/59  Pulse:  64  Temp:  97.7 F (36.5 C)  TempSrc:  Oral  Resp: 14 16  SpO2:  100%    Wt Readings from Last 3 Encounters:  04/23/14 72.303 kg (159 lb 6.4 oz)  03/18/14 67.949 kg (149 lb 12.8 oz)  02/18/14 68.493 kg (151 lb)    General:  Elderly Caucasian gentleman, appears calm and comfortable in NAD Eyes: PERRL, normal lids, irises & conjunctiva ENT: grossly normal hearing, lips & tongue.  Dry mucous membranes Neck: no LAD, masses or thyromegaly Cardiovascular: RRR, no m/r/g. Trace LE edema. Telemetry: SR, no arrhythmias  Respiratory: CTA bilaterally, no w/r/r. Normal respiratory effort. Abdomen: soft, ntnd Skin: no rash or induration seen on limited exam Musculoskeletal: grossly normal tone BUE/BLE Psychiatric: grossly normal mood and affect, speech fluent  and appropriate Neurologic: grossly non-focal.          Labs on Admission:  Basic Metabolic Panel:  Recent Labs Lab 05/05/14 1005  NA 140  K 4.4  CL 102  CO2 24  GLUCOSE 113*  BUN 31*  CREATININE 2.00*  CALCIUM 8.8   Liver Function Tests:  Recent Labs Lab 05/05/14 1005  AST 16  ALT 9  ALKPHOS 72  BILITOT 0.4  PROT 6.9  ALBUMIN 3.3*   No results for input(s): LIPASE, AMYLASE in the last 168 hours. No results for input(s): AMMONIA in the last 168 hours. CBC:  Recent Labs Lab 05/05/14 1005  WBC 6.4  NEUTROABS 4.8  HGB 10.6*  HCT 33.4*  MCV 91.8  PLT 171  Cardiac Enzymes: No results for input(s): CKTOTAL, CKMB, CKMBINDEX, TROPONINI in the last 168 hours.  BNP (last 3 results) No results for input(s): PROBNP in the last 8760 hours. CBG: No results for input(s): GLUCAP in the last 168 hours.  Radiological Exams on Admission: Dg Chest 2 View  05/05/2014   CLINICAL DATA:  Pt has hx of orthostatic changes; found unconscious by wife in kitchen. Wife reports passed out for 3-5 mins. Pt has ungoing hypotension issues and has been told by cardiologist to eat salty food when he feels it gets low. Pt was trying to fix some soup at the time. Lacerations to head and skin tears bilaterally on arms. Hx of 5 stents and AAA. Denies chest pain. States "just weak"; denies pain.  EXAM: CHEST  2 VIEW  COMPARISON:  10/08/2013  FINDINGS: Cardiopericardial silhouette is mildly enlarged. Changes from previous cardiac surgery are stable.  No mediastinal or hilar masses or evidence of adenopathy.  Lungs are clear.  No pleural effusion or pneumothorax.  Bony thorax is demineralized but grossly intact.  IMPRESSION: No acute cardiopulmonary disease.   Electronically Signed   By: Lajean Manes M.D.   On: 05/05/2014 11:27   Dg Pelvis 1-2 Views  05/05/2014   CLINICAL DATA:  Pt has hx of orthostatic changes; found unconscious by wife in kitchen. Wife reports passed out for 3-5 mins. Pt has  ungoing hypotension issues and has been told by cardiologist to eat salty food when he feels it gets low. Pt was trying to fix some soup at the time. Lacerations to head and skin tears bilaterally on arms. Hx of 5 stents and AAA. Denies chest pain. States "just weak"; denies pain.  EXAM: PELVIS - 1-2 VIEW  COMPARISON:  CT, 04/23/2014  FINDINGS: No fracture. No bone lesion. The hip joints, SI joints and symphysis pubis are normally space and aligned. No significant arthropathic change. The bones are diffusely demineralized.  Aortoiliac vascular stents are stable from the prior CT. A coil mass lies in the right upper pelvis, also unchanged. Vascular calcifications extend along the iliac and femoral arteries.  IMPRESSION: 1. No acute findings.  No fracture or dislocation.   Electronically Signed   By: Lajean Manes M.D.   On: 05/05/2014 11:28   Ct Head Wo Contrast  05/05/2014   CLINICAL DATA:  80 yo, fall, syncope. Head injury. Found unconscious on floor. Loss of consciousness for 3-5 min. Lacerations to head.  EXAM: CT HEAD WITHOUT CONTRAST  CT CERVICAL SPINE WITHOUT CONTRAST  TECHNIQUE: Multidetector CT imaging of the head and cervical spine was performed following the standard protocol without intravenous contrast. Multiplanar CT image reconstructions of the cervical spine were also generated.  COMPARISON:  C-spine CT 09/14/2013  FINDINGS: CT HEAD FINDINGS  There is atrophy and chronic small vessel disease changes. Old bilateral basal ganglia lacunar infarcts. No acute intracranial abnormality. Specifically, no hemorrhage, hydrocephalus, mass lesion, acute infarction, or significant intracranial injury. No acute calvarial abnormality.  Mucosal thickening within the paranasal sinuses. No air-fluid levels.  CT CERVICAL SPINE FINDINGS  Advanced degenerative disc disease throughout the cervical spine. Bilateral degenerative facet disease. Slight levoscoliosis of the cervical spine which could be positional.  Prevertebral soft tissues are normal. No epidural or paraspinal hematoma. No fracture.  IMPRESSION: No acute intracranial abnormality.  No acute bony abnormality in the cervical spine   Electronically Signed   By: Rolm Baptise M.D.   On: 05/05/2014 11:50   Ct Cervical Spine Wo Contrast  05/05/2014   CLINICAL DATA:  80 yo, fall, syncope. Head injury. Found unconscious on floor. Loss of consciousness for 3-5 min. Lacerations to head.  EXAM: CT HEAD WITHOUT CONTRAST  CT CERVICAL SPINE WITHOUT CONTRAST  TECHNIQUE: Multidetector CT imaging of the head and cervical spine was performed following the standard protocol without intravenous contrast. Multiplanar CT image reconstructions of the cervical spine were also generated.  COMPARISON:  C-spine CT 09/14/2013  FINDINGS: CT HEAD FINDINGS  There is atrophy and chronic small vessel disease changes. Old bilateral basal ganglia lacunar infarcts. No acute intracranial abnormality. Specifically, no hemorrhage, hydrocephalus, mass lesion, acute infarction, or significant intracranial injury. No acute calvarial abnormality.  Mucosal thickening within the paranasal sinuses. No air-fluid levels.  CT CERVICAL SPINE FINDINGS  Advanced degenerative disc disease throughout the cervical spine. Bilateral degenerative facet disease. Slight levoscoliosis of the cervical spine which could be positional. Prevertebral soft tissues are normal. No epidural or paraspinal hematoma. No fracture.  IMPRESSION: No acute intracranial abnormality.  No acute bony abnormality in the cervical spine   Electronically Signed   By: Rolm Baptise M.D.   On: 05/05/2014 11:50    EKG: Independently reviewed. Normal sinus rhythm rate of 63.  Assessment/Plan Active Problems:   Syncope   Syncope and Fall -Likely secondary to vasovagal response; patient states he is on multiple antihypertensive  -patient will be admitted to telemetry unit to monitor for any arrhythmias -CT head  negative -orderedCarotid Doppler, orthostatic vitals,neurochecks every 4 hrs. -PT eval -Will not obtain echocardiogram as it was completed in March 2015 showed EF of 65-70% with a grade 1 diastolic dysfunction.  Generalized weakness -Likely secondary to antihypertensive medication, dehydration. -Ordered TSH, vitamin D levels, PT  Acute on chronic kidney  Disease, stage III -Baseline creatinine 1.5, 2 upon admission. -Secondary to dehydration from Lasix, and ARB -Gentle hydration with IV fluids at 50 ml/hr  Chronic diastolic dysfunction -Appears uvolemic -Obtain daily weights -Strict I & Os -Hydralazine as needed  Hypertension -Hold antihypertensives for now due to AK I -When necessary PRN hydralazine  BPH -Continue Flomax  Coronary artery disease -Stable- no chest pain -Continue aspirin  Gout -Stable hold colchicine  Hyperlipidemia -Continue statin  Code Status: full DVT Prophylaxis: SQ heparin Family Communication: multiple family members at bedside  Disposition Plan: admitted for observation  Time spent: [place], [facility] Pager [phone]

## 2014-05-06 DIAGNOSIS — D649 Anemia, unspecified: Secondary | ICD-10-CM | POA: Diagnosis present

## 2014-05-06 DIAGNOSIS — I252 Old myocardial infarction: Secondary | ICD-10-CM | POA: Diagnosis not present

## 2014-05-06 DIAGNOSIS — I2510 Atherosclerotic heart disease of native coronary artery without angina pectoris: Secondary | ICD-10-CM | POA: Diagnosis present

## 2014-05-06 DIAGNOSIS — Z79899 Other long term (current) drug therapy: Secondary | ICD-10-CM | POA: Diagnosis not present

## 2014-05-06 DIAGNOSIS — I129 Hypertensive chronic kidney disease with stage 1 through stage 4 chronic kidney disease, or unspecified chronic kidney disease: Secondary | ICD-10-CM | POA: Diagnosis present

## 2014-05-06 DIAGNOSIS — Z8249 Family history of ischemic heart disease and other diseases of the circulatory system: Secondary | ICD-10-CM | POA: Diagnosis not present

## 2014-05-06 DIAGNOSIS — Z7982 Long term (current) use of aspirin: Secondary | ICD-10-CM | POA: Diagnosis not present

## 2014-05-06 DIAGNOSIS — H409 Unspecified glaucoma: Secondary | ICD-10-CM | POA: Diagnosis present

## 2014-05-06 DIAGNOSIS — E860 Dehydration: Secondary | ICD-10-CM | POA: Diagnosis present

## 2014-05-06 DIAGNOSIS — M109 Gout, unspecified: Secondary | ICD-10-CM | POA: Diagnosis present

## 2014-05-06 DIAGNOSIS — E785 Hyperlipidemia, unspecified: Secondary | ICD-10-CM | POA: Diagnosis present

## 2014-05-06 DIAGNOSIS — S0990XA Unspecified injury of head, initial encounter: Secondary | ICD-10-CM | POA: Diagnosis present

## 2014-05-06 DIAGNOSIS — I714 Abdominal aortic aneurysm, without rupture: Secondary | ICD-10-CM | POA: Diagnosis present

## 2014-05-06 DIAGNOSIS — N400 Enlarged prostate without lower urinary tract symptoms: Secondary | ICD-10-CM | POA: Diagnosis present

## 2014-05-06 DIAGNOSIS — Z87891 Personal history of nicotine dependence: Secondary | ICD-10-CM | POA: Diagnosis not present

## 2014-05-06 DIAGNOSIS — N179 Acute kidney failure, unspecified: Secondary | ICD-10-CM | POA: Diagnosis present

## 2014-05-06 DIAGNOSIS — I10 Essential (primary) hypertension: Secondary | ICD-10-CM

## 2014-05-06 DIAGNOSIS — R55 Syncope and collapse: Secondary | ICD-10-CM

## 2014-05-06 DIAGNOSIS — Z951 Presence of aortocoronary bypass graft: Secondary | ICD-10-CM | POA: Diagnosis not present

## 2014-05-06 DIAGNOSIS — W1830XA Fall on same level, unspecified, initial encounter: Secondary | ICD-10-CM | POA: Diagnosis present

## 2014-05-06 DIAGNOSIS — N183 Chronic kidney disease, stage 3 (moderate): Secondary | ICD-10-CM | POA: Diagnosis present

## 2014-05-06 LAB — CBC
HCT: 32.1 % — ABNORMAL LOW (ref 39.0–52.0)
Hemoglobin: 10.2 g/dL — ABNORMAL LOW (ref 13.0–17.0)
MCH: 28.4 pg (ref 26.0–34.0)
MCHC: 31.8 g/dL (ref 30.0–36.0)
MCV: 89.4 fL (ref 78.0–100.0)
PLATELETS: 180 10*3/uL (ref 150–400)
RBC: 3.59 MIL/uL — AB (ref 4.22–5.81)
RDW: 14.5 % (ref 11.5–15.5)
WBC: 7.2 10*3/uL (ref 4.0–10.5)

## 2014-05-06 LAB — BASIC METABOLIC PANEL
ANION GAP: 12 (ref 5–15)
BUN: 30 mg/dL — ABNORMAL HIGH (ref 6–23)
CO2: 23 meq/L (ref 19–32)
Calcium: 8.2 mg/dL — ABNORMAL LOW (ref 8.4–10.5)
Chloride: 106 mEq/L (ref 96–112)
Creatinine, Ser: 1.79 mg/dL — ABNORMAL HIGH (ref 0.50–1.35)
GFR calc Af Amer: 36 mL/min — ABNORMAL LOW (ref >90)
GFR, EST NON AFRICAN AMERICAN: 31 mL/min — AB (ref >90)
GLUCOSE: 92 mg/dL (ref 70–99)
POTASSIUM: 4.4 meq/L (ref 3.7–5.3)
SODIUM: 141 meq/L (ref 137–147)

## 2014-05-06 LAB — VITAMIN D 25 HYDROXY (VIT D DEFICIENCY, FRACTURES): VIT D 25 HYDROXY: 19 ng/mL — AB (ref 30–89)

## 2014-05-06 MED ORDER — SODIUM CHLORIDE 0.9 % IV SOLN
INTRAVENOUS | Status: DC
  Administered 2014-05-06 (×2): via INTRAVENOUS

## 2014-05-06 MED ORDER — DIPHENHYDRAMINE HCL 25 MG PO CAPS
50.0000 mg | ORAL_CAPSULE | Freq: Once | ORAL | Status: AC
  Administered 2014-05-06: 50 mg via ORAL
  Filled 2014-05-06: qty 2

## 2014-05-06 MED ORDER — HYDRALAZINE HCL 20 MG/ML IJ SOLN: 10.0000 mg | Freq: Four times a day (QID) | INTRAMUSCULAR | Status: DC | PRN

## 2014-05-06 MED ORDER — TAMSULOSIN HCL 0.4 MG PO CAPS
0.4000 mg | ORAL_CAPSULE | Freq: Every day | ORAL | Status: DC
  Administered 2014-05-06: 0.4 mg via ORAL
  Filled 2014-05-06: qty 1

## 2014-05-06 NOTE — Progress Notes (Signed)
VASCULAR LAB PRELIMINARY  PRELIMINARY  PRELIMINARY  PRELIMINARY  Carotid duplex  completed.    Preliminary report:  Right: 1-39% ICA stenosis (high end of scale).  Left: 40-59% internal carotid artery stenosis.   Bilateral:  Vertebral artery flow is antegrade.     Maxine Huynh, RVT 05/06/2014, 11:24 AM

## 2014-05-06 NOTE — Evaluation (Signed)
Physical Therapy Evaluation Patient Details Name: Robert Ritter MRN: [id number] DOB: 29-Jun-1919 Today's Date: 05/06/2014   History of Present Illness    This is a 80 year old gentleman with a history of PKD, stage III, coronary disease, hypertension presented to the emergency department with complaint of fall and feeling weak. Patient had taken his blood pressure medications multiple ones over the past 24 hours. He states he takes Lasix for the swelling in his legs, gout and kidney problems. Patient states that the Lasix is best to help him with these things. He felt that he was somewhat weak this morning and decided to have some chicken soup which is high in sodium. Patient stated he started to feel dizzy at which point he fell he was able to eat himself up however possibly a chair, his wife found him slumped over. At that point she called 911. Patient was brought to the emergency department. Upon admission, patient did have multiple lacerations on the face as well as upper extremities. Imaging was negative for any acute fractures or abnormalities.   Clinical Impression  Pt currently with functional limitations due to decreased balance, mobility, strength and increased pain. Pt will benefit from skilled PT to increase independence and safety with mobility to allow discharge to home with home health. Pt able to ambulate safely using RW and Pt agreed that he would be willing to use the walker at discharge.     Follow Up Recommendations Home health PT    Equipment Recommendations  Rolling walker with 5" wheels    Recommendations for Other Services       Precautions / Restrictions Precautions Precautions: Fall Restrictions Weight Bearing Restrictions: No      Mobility  Bed Mobility Overal bed mobility: Needs Assistance Bed Mobility: Supine to Sit     Supine to sit: Min assist     General bed mobility comments: Pt able to pre position himself for sitting without assistance but  required min assist for bringing his trunk up to sitting.   Transfers Overall transfer level: Needs assistance Equipment used: Rolling walker (2 wheeled) Transfers: Sit to/from Stand Sit to Stand: Min assist         General transfer comment: Pt required boost to get to standing from sitting EOB. Pt reported no dizziness when sitting EOB or once standing.   Ambulation/Gait Ambulation/Gait assistance: Min guard Ambulation Distance (Feet): 200 Feet Assistive device: Rolling walker (2 wheeled) Gait Pattern/deviations: Decreased stride length;Step-through pattern;Decreased stance time - left;Wide base of support     General Gait Details: Pt able to ambulate safely using RW. Pt demonstrated sufficient endurance for household ambulation. Pt able to communicate with PT during ambulation but is HOH on the Left so Pt had difficulty hearing PT. PT unable to perform challenges with ambulation during today's session. Pt able to ambulate at a near normal gait for his age.    Stairs            Wheelchair Mobility    Modified Rankin (Stroke Patients Only)       Balance Overall balance assessment: Needs assistance Sitting-balance support: Feet supported;No upper extremity supported Sitting balance-Leahy Scale: Good     Standing balance support: Bilateral upper extremity supported;During functional activity Standing balance-Leahy Scale: Fair                               Pertinent Vitals/Pain Pain Assessment: 0-10 Pain Score: 4  Pain Location: Left  leg around the knee Pain Intervention(s): Limited activity within patient's tolerance;Monitored during session;Repositioned  Pt's pain decreased to 2/10 pain when standing. Pt's pain was 4/10 prior to ambulation when in bed.     Home Living Family/patient expects to be discharged to:: Private residence Living Arrangements: Spouse/significant other Available Help at Discharge: Family Type of Home: House Home Access:  Stairs to enter Entrance Stairs-Rails: None Entrance Stairs-Number of Steps: 2 Home Layout: Two level;Able to live on main level with bedroom/bathroom Home Equipment: Gilford Rile - 2 wheels;Walker - 4 wheels;Cane - single point      Prior Function Level of Independence: Independent               Hand Dominance        Extremity/Trunk Assessment               Lower Extremity Assessment: Overall WFL for tasks assessed         Communication   Communication: No difficulties  Cognition Arousal/Alertness: Awake/alert Behavior During Therapy: WFL for tasks assessed/performed Overall Cognitive Status: Within Functional Limits for tasks assessed                      General Comments      Exercises        Assessment/Plan    PT Assessment Patient needs continued PT services  PT Diagnosis Difficulty walking   PT Problem List Decreased strength;Decreased knowledge of use of DME;Decreased mobility;Decreased balance;Decreased activity tolerance;Pain  PT Treatment Interventions DME instruction;Gait training;Stair training;Therapeutic activities;Therapeutic exercise;Balance training;Patient/family education   PT Goals (Current goals can be found in the Care Plan section) Acute Rehab PT Goals Patient Stated Goal: Return home PT Goal Formulation: With patient Time For Goal Achievement: 05/20/14 Potential to Achieve Goals: Good    Frequency Min 3X/week   Barriers to discharge        Co-evaluation               End of Session Equipment Utilized During Treatment: Gait belt Activity Tolerance: Patient tolerated treatment well;Patient limited by pain Patient left: in chair;with call bell/phone within reach;with chair alarm set      Functional Assessment Tool Used: clinical judgment Functional Limitation: Mobility: Walking and moving around Mobility: Walking and Moving Around Current Status [phone]): At least 1 percent but less than 20 percent impaired,  limited or restricted Mobility: Walking and Moving Around Goal Status [phone]): 0 percent impaired, limited or restricted    Time: 1206-1233 PT Time Calculation (min) (ACUTE ONLY): 27 min   Charges:   PT Evaluation $Initial PT Evaluation Tier I: 1 Procedure PT Treatments $Gait Training: 8-22 mins   PT G Codes:   Functional Assessment Tool Used: clinical judgment Functional Limitation: Mobility: Walking and moving around    [place], SPT 05/06/2014, 1:02 PM   Jearld Shines, SPT  Acute Rehabilitation [phone]

## 2014-05-06 NOTE — Evaluation (Addendum)
Occupational Therapy Evaluation Patient Details Name: Robert Ritter MRN: [id number] DOB: 17-Jul-1919 Today's Date: 05/06/2014    History of Present Illness This is a 80 year old gentleman with a history of PKD, stage III, coronary disease, hypertension presented to the emergency department with complaint of fall and feeling weak. Patient had taken his blood pressure medications multiple ones over the past 24 hours. He states he takes Lasix for the swelling in his legs, gout and kidney problems. Patient states that the Lasix is best to help him with these things. He felt that he was somewhat weak this morning and decided to have some chicken soup which is high in sodium. Patient stated he started to feel dizzy at which point he fell he was able to eat himself up however possibly a chair, his wife found him slumped over. At that point she called 911. Patient was brought to the emergency department. Upon admission, patient did have multiple lacerations on the face as well as upper extremities. Imaging was negative for any acute fractures or abnormalities.   Clinical Impression   Pt s/p above. Pt independent with ADLs, PTA. Feel pt will benefit from acute OT to increase independence prior to d/c. Plan to practice tub transfer next session.    Follow Up Recommendations  No OT follow up;Supervision/Assistance - 24 hour    Equipment Recommendations  None recommended by OT    Recommendations for Other Services       Precautions / Restrictions Precautions Precautions: Fall Restrictions Weight Bearing Restrictions: No      Mobility Bed Mobility Overal bed mobility: Needs Assistance Bed Mobility: Supine to Sit     Supine to sit: Mod assist     General bed mobility comments: assist with trunk. Cues for technique.  Transfers Overall transfer level: Needs assistance Equipment used: Rolling walker (2 wheeled);None Transfers: Sit to/from Stand Sit to Stand: Min assist                  ADL Overall ADL's : Needs assistance/impaired     Grooming: Wash/dry face;Oral care;Min guard;Standing (wet hair)               Lower Body Dressing: Min guard;Sit to/from stand   Toilet Transfer: Minimal assistance;Min guard;Ambulation;RW (bed; ambulated with and without walker)           Functional mobility during ADLs: Min guard;Minimal assistance;Rolling walker (ambulated with and without walker) General ADL Comments: Educated on safety (safe shoewear, use of bag on walker, rugs, sitting for most of LB ADLs). Recommended someone be with him for tub transfer and recommended not stepping over but educated on alternative technique.      Vision                     Perception     Praxis      Pertinent Vitals/Pain Pain Assessment: No/denies pain      Hand Dominance     Extremity/Trunk Assessment Upper Extremity Assessment Upper Extremity Assessment: Overall WFL for tasks assessed   Lower Extremity Assessment Lower Extremity Assessment: Defer to PT evaluation       Communication Communication Communication: HOH   Cognition Arousal/Alertness: Awake/alert Behavior During Therapy: WFL for tasks assessed/performed Overall Cognitive Status: Within Functional Limits for tasks assessed                     General Comments       Exercises       Shoulder Instructions  Home Living Family/patient expects to be discharged to:: Private residence Living Arrangements: Spouse/significant other Available Help at Discharge: Family;Available 24 hours/day Type of Home: House Home Access: Stairs to enter [employer] of Steps:  (1 step on one entrance with no rail; 3-4 steps on other entrance with rails on both sides that far apart) Entrance Stairs-Rails: None Home Layout: Able to live on main level with bedroom/bathroom (1 1/2 level)     Bathroom Shower/Tub: Teacher, early years/pre: Standard     Home Equipment:  Environmental consultant - 2 wheels;Walker - 4 wheels;Cane - single point;Bedside commode;Shower seat          Prior Functioning/Environment Level of Independence: Independent             OT Diagnosis: Acute pain;Other (comment) (decreased balance)   OT Problem List: Decreased knowledge of use of DME or AE;Decreased knowledge of precautions;Pain;Impaired balance (sitting and/or standing);Decreased strength   OT Treatment/Interventions: Self-care/ADL training;DME and/or AE instruction;Therapeutic activities;Balance training;Patient/family education    OT Goals(Current goals can be found in the care plan section) Acute Rehab OT Goals Patient Stated Goal: not stated OT Goal Formulation: With patient Time For Goal Achievement: 05/13/14 Potential to Achieve Goals: Good ADL Goals Pt Will Perform Lower Body Dressing: with modified independence;sit to/from stand Pt Will Transfer to Toilet: with modified independence;ambulating Pt Will Perform Tub/Shower Transfer: Tub transfer;with supervision;ambulating;shower seat;rolling walker  OT Frequency: Min 2X/week   Barriers to D/C:            Co-evaluation              End of Session Equipment Utilized During Treatment: Gait belt;Rolling walker  Activity Tolerance: Patient tolerated treatment well Patient left: in bed;with call bell/phone within reach;with family/visitor present   Time: 1610-9604 OT Time Calculation (min): 28 min Charges:  OT General Charges $OT Visit: 1 Procedure OT Evaluation $Initial OT Evaluation Tier I: 1 Procedure OT Treatments $Self Care/Home Management : 8-22 mins G-Codes: OT G-codes **NOT FOR INPATIENT CLASS** Functional Assessment Tool Used: clinical judgment Functional Limitation: Self care Self Care Current Status (V4098): At least 1 percent but less than 20 percent impaired, limited or restricted Self Care Goal Status (J1914): At least 1 percent but less than 20 percent impaired, limited or restricted   Benito Mccreedy OTR/L [phone] 05/06/2014, 4:35 PM

## 2014-05-06 NOTE — Progress Notes (Signed)
[facility] PROGRESS NOTE   FAHIM KATS BEE:[id number] DOB: Nov 15, 1919 DOA: 05/05/2014 PCP: Walker Kehr, MD  HPI/Subjective: Feels okay today, so complaining about generalized weakness.  Assessment/Plan: Principal Problem:   Syncope Active Problems:   Hyperlipidemia   Gout   Anemia   Essential hypertension   Coronary atherosclerosis   Fall   Renal failure (ARF), acute on chronic   Syncope and collapse    Syncope and collapse -Likely secondary to vasovagal response, patient is on multiple blood pressure medications. -Rule out intracranial events, patient has negative CT head, check MRI of the brain. -Rest of syncope workup including carotid duplex, neuro checks and PT evaluation. -Repeat 2-D echo which was done in March 2015. Hold Lasix for now. -Orthostatic vitals showing severe orthostatic hypotension with blood pressure decreased from 166 to 115.  Generalized weakness -Likely secondary to overall dehydration, patient does have low vitamin D levels. -Ask PT/OT to evaluate and treat.  Acute on chronic kidney disease, stage III -Baseline about 1.5, presented with creatinine of 2 upon admission. -Patient is on ARB, Lasix hold both. -Gentle hydration with IV fluids at 50 mL/h.  Chronic diastolic dysfunction -Appears euvolemic, obtain daily weights, currently holding Lasix. -Hydralazine as needed for high blood pressure.  HTN/CAD -Hold antihypertensive for the acute kidney injury. -continue aspirin.  BPH -Stable, continue Flomax.  Code Status: full code Family Communication: Plan discussed with the patient. Disposition Plan: Remains inpatient   Consultants:  none  Procedures:  none  Antibiotics:  none  Objective: Filed Vitals:   05/06/14 1003  BP: 142/51  Pulse: 65  Temp: 98.1 F (36.7 C)  Resp: 18    Intake/Output Summary (Last 24 hours) at 05/06/14 1120 Last data filed at 05/06/14 1000  Gross per 24 hour  Intake    390 ml   Output    650 ml  Net   -260 ml   Filed Weights   05/05/14 1439 05/06/14 0000  Weight: 67.994 kg (149 lb 14.4 oz) 70.308 kg (155 lb)    Exam: General: Alert and awake, oriented x3, not in any acute distress. HEENT: anicteric sclera, pupils reactive to light and accommodation, EOMI CVS: S1-S2 clear, no murmur rubs or gallops Chest: clear to auscultation bilaterally, no wheezing, rales or rhonchi Abdomen: soft nontender, nondistended, normal bowel sounds, no organomegaly Extremities: no cyanosis, clubbing or edema noted bilaterally Neuro: Cranial nerves II-XII intact, no focal neurological deficits  Data Reviewed: Basic Metabolic Panel:  Recent Labs Lab 05/05/14 1005 05/06/14 0447  NA 140 141  K 4.4 4.4  CL 102 106  CO2 24 23  GLUCOSE 113* 92  BUN 31* 30*  CREATININE 2.00* 1.79*  CALCIUM 8.8 8.2*   Liver Function Tests:  Recent Labs Lab 05/05/14 1005  AST 16  ALT 9  ALKPHOS 72  BILITOT 0.4  PROT 6.9  ALBUMIN 3.3*   No results for input(s): LIPASE, AMYLASE in the last 168 hours. No results for input(s): AMMONIA in the last 168 hours. CBC:  Recent Labs Lab 05/05/14 1005 05/06/14 0447  WBC 6.4 7.2  NEUTROABS 4.8  --   HGB 10.6* 10.2*  HCT 33.4* 32.1*  MCV 91.8 89.4  PLT 171 180   Cardiac Enzymes: No results for input(s): CKTOTAL, CKMB, CKMBINDEX, TROPONINI in the last 168 hours. BNP (last 3 results) No results for input(s): PROBNP in the last 8760 hours. CBG: No results for input(s): GLUCAP in the last 168 hours.  Micro No results found for this or any  previous visit (from the past 240 hour(s)).   Studies: Dg Chest 2 View  05/05/2014   CLINICAL DATA:  Pt has hx of orthostatic changes; found unconscious by wife in kitchen. Wife reports passed out for 3-5 mins. Pt has ungoing hypotension issues and has been told by cardiologist to eat salty food when he feels it gets low. Pt was trying to fix some soup at the time. Lacerations to head and skin tears  bilaterally on arms. Hx of 5 stents and AAA. Denies chest pain. States "just weak"; denies pain.  EXAM: CHEST  2 VIEW  COMPARISON:  10/08/2013  FINDINGS: Cardiopericardial silhouette is mildly enlarged. Changes from previous cardiac surgery are stable.  No mediastinal or hilar masses or evidence of adenopathy.  Lungs are clear.  No pleural effusion or pneumothorax.  Bony thorax is demineralized but grossly intact.  IMPRESSION: No acute cardiopulmonary disease.   Electronically Signed   By: Lajean Manes M.D.   On: 05/05/2014 11:27   Dg Pelvis 1-2 Views  05/05/2014   CLINICAL DATA:  Pt has hx of orthostatic changes; found unconscious by wife in kitchen. Wife reports passed out for 3-5 mins. Pt has ungoing hypotension issues and has been told by cardiologist to eat salty food when he feels it gets low. Pt was trying to fix some soup at the time. Lacerations to head and skin tears bilaterally on arms. Hx of 5 stents and AAA. Denies chest pain. States "just weak"; denies pain.  EXAM: PELVIS - 1-2 VIEW  COMPARISON:  CT, 04/23/2014  FINDINGS: No fracture. No bone lesion. The hip joints, SI joints and symphysis pubis are normally space and aligned. No significant arthropathic change. The bones are diffusely demineralized.  Aortoiliac vascular stents are stable from the prior CT. A coil mass lies in the right upper pelvis, also unchanged. Vascular calcifications extend along the iliac and femoral arteries.  IMPRESSION: 1. No acute findings.  No fracture or dislocation.   Electronically Signed   By: Lajean Manes M.D.   On: 05/05/2014 11:28   Ct Head Wo Contrast  05/05/2014   CLINICAL DATA:  80 yo, fall, syncope. Head injury. Found unconscious on floor. Loss of consciousness for 3-5 min. Lacerations to head.  EXAM: CT HEAD WITHOUT CONTRAST  CT CERVICAL SPINE WITHOUT CONTRAST  TECHNIQUE: Multidetector CT imaging of the head and cervical spine was performed following the standard protocol without intravenous contrast.  Multiplanar CT image reconstructions of the cervical spine were also generated.  COMPARISON:  C-spine CT 09/14/2013  FINDINGS: CT HEAD FINDINGS  There is atrophy and chronic small vessel disease changes. Old bilateral basal ganglia lacunar infarcts. No acute intracranial abnormality. Specifically, no hemorrhage, hydrocephalus, mass lesion, acute infarction, or significant intracranial injury. No acute calvarial abnormality.  Mucosal thickening within the paranasal sinuses. No air-fluid levels.  CT CERVICAL SPINE FINDINGS  Advanced degenerative disc disease throughout the cervical spine. Bilateral degenerative facet disease. Slight levoscoliosis of the cervical spine which could be positional. Prevertebral soft tissues are normal. No epidural or paraspinal hematoma. No fracture.  IMPRESSION: No acute intracranial abnormality.  No acute bony abnormality in the cervical spine   Electronically Signed   By: Rolm Baptise M.D.   On: 05/05/2014 11:50   Ct Cervical Spine Wo Contrast  05/05/2014   CLINICAL DATA:  80 yo, fall, syncope. Head injury. Found unconscious on floor. Loss of consciousness for 3-5 min. Lacerations to head.  EXAM: CT HEAD WITHOUT CONTRAST  CT CERVICAL SPINE WITHOUT  CONTRAST  TECHNIQUE: Multidetector CT imaging of the head and cervical spine was performed following the standard protocol without intravenous contrast. Multiplanar CT image reconstructions of the cervical spine were also generated.  COMPARISON:  C-spine CT 09/14/2013  FINDINGS: CT HEAD FINDINGS  There is atrophy and chronic small vessel disease changes. Old bilateral basal ganglia lacunar infarcts. No acute intracranial abnormality. Specifically, no hemorrhage, hydrocephalus, mass lesion, acute infarction, or significant intracranial injury. No acute calvarial abnormality.  Mucosal thickening within the paranasal sinuses. No air-fluid levels.  CT CERVICAL SPINE FINDINGS  Advanced degenerative disc disease throughout the cervical spine.  Bilateral degenerative facet disease. Slight levoscoliosis of the cervical spine which could be positional. Prevertebral soft tissues are normal. No epidural or paraspinal hematoma. No fracture.  IMPRESSION: No acute intracranial abnormality.  No acute bony abnormality in the cervical spine   Electronically Signed   By: Rolm Baptise M.D.   On: 05/05/2014 11:50    Scheduled Meds: . aspirin EC  162 mg Oral Daily  . heparin  5,000 Units Subcutaneous 3 times per day  . latanoprost  1 drop Both Eyes QHS  . omega-3 acid ethyl esters  1 g Oral Daily  . simvastatin  20 mg Oral Daily  . terazosin  5 mg Oral QHS   Continuous Infusions: . sodium chloride 50 mL/hr at 05/05/14 1515       Time spent: 35 minutes    [facility] A  [facility] Pager [phone] If 7PM-7AM, please contact night-coverage at [email or web address], password [facility] 05/06/2014, 11:20 AM  LOS: 1 day

## 2014-05-07 LAB — BASIC METABOLIC PANEL
Anion gap: 12 (ref 5–15)
BUN: 26 mg/dL — AB (ref 6–23)
CALCIUM: 8.2 mg/dL — AB (ref 8.4–10.5)
CO2: 22 mEq/L (ref 19–32)
CREATININE: 1.64 mg/dL — AB (ref 0.50–1.35)
Chloride: 105 mEq/L (ref 96–112)
GFR calc non Af Amer: 35 mL/min — ABNORMAL LOW (ref >90)
GFR, EST AFRICAN AMERICAN: 41 mL/min — AB (ref >90)
GLUCOSE: 85 mg/dL (ref 70–99)
POTASSIUM: 4.3 meq/L (ref 3.7–5.3)
Sodium: 139 mEq/L (ref 137–147)

## 2014-05-07 MED ORDER — FUROSEMIDE 40 MG PO TABS: 40.0000 mg | ORAL_TABLET | Freq: Two times a day (BID) | ORAL | Status: AC

## 2014-05-07 MED ORDER — TAMSULOSIN HCL 0.4 MG PO CAPS: 0.4000 mg | ORAL_CAPSULE | Freq: Every day | ORAL | Status: AC

## 2014-05-07 MED ORDER — TELMISARTAN 20 MG PO TABS: 20.0000 mg | ORAL_TABLET | Freq: Every day | ORAL | Status: AC

## 2014-05-07 NOTE — Care Management Note (Addendum)
    Page 1 of 1   05/07/2014     2:25:30 PM CARE MANAGEMENT NOTE 05/07/2014  Patient:  Robert Ritter, Robert Ritter   Account Number:  [id number]  Date Initiated:  05/07/2014  Documentation initiated by:  GRAVES-BIGELOW,Shalandria Elsbernd  Subjective/Objective Assessment:   Pt admitted for syncope. Pt is from home with wife. CM did discuss with pt in ref to [facility] for management of medication. Pt refused at time will discuss with wife.     Action/Plan:   Referral for [facility] PT- Will offer choice to wife once arrives. No DME needs at this time.   Anticipated DC Date:  05/07/2014   Anticipated DC Plan:  [place]  CM consult      Choice offered to / List presented to:  C-3 Spouse        HH arranged  HH-1 RN  HH-10 DISEASE MANAGEMENT  HH-2 PT  HH-3 OT      Status of service:  Completed, signed off Medicare Important Message given?  NA - LOS <3 / Initial given by admissions (If response is "NO", the following Medicare IM given date fields will be blank) Date Medicare IM given:   Medicare IM given by:   Date Additional Medicare IM given:   Additional Medicare IM given by:    Discharge Disposition:  HOME/SELF CARE  Per UR Regulation:  Reviewed for med. necessity/level of care/duration of stay  If discussed at [place] of Stay Meetings, dates discussed:    Comments:  05-07-14 [place], [place] [phone] Referral was sent to RN CM for [facility] services. Pt was d/c befoe CM was able to speak to pt. CM did call pt and wife at home. Pt wants to wait a week to see if he willneed Services. CM did make wife aware that if he will need services to contact his PCP for start of care orders. MD aware and no further needs from CM at this time.

## 2014-05-07 NOTE — Plan of Care (Signed)
Problem: Phase II Progression Outcomes Goal: Progress activity as tolerated unless otherwise ordered Outcome: Completed/Met Date Met:  05/07/14 Goal: Discharge plan established Outcome: Completed/Met Date Met:  05/07/14 Goal: Vital signs remain stable Outcome: Completed/Met Date Met:  05/07/14 Goal: Obtain order to discontinue catheter if appropriate Outcome: Not Applicable Date Met:  05/07/14 Goal: Other Phase II Outcomes/Goals Outcome: Not Applicable Date Met:  05/07/14  Problem: Phase III Progression Outcomes Goal: Pain controlled on oral analgesia Outcome: Completed/Met Date Met:  05/07/14 Goal: Activity at appropriate level-compared to baseline (UP IN CHAIR FOR HEMODIALYSIS)  Outcome: Completed/Met Date Met:  05/07/14  Problem: Discharge Progression Outcomes Goal: Pain controlled with appropriate interventions Outcome: Completed/Met Date Met:  05/07/14     

## 2014-05-07 NOTE — Discharge Summary (Signed)
Physician Discharge Summary  Robert Ritter OAC:[id number] DOB: [birth year] DOA: 05/05/2014  PCP: Walker Kehr, MD  Admit date: 05/05/2014 Discharge date: 05/07/2014  Time spent: 40 minutes  Recommendations for Outpatient Follow-up:  1. Follow-up with primary care physician within one week.  Discharge Diagnoses:  Principal Problem:   Syncope Active Problems:   Hyperlipidemia   Gout   Anemia   Essential hypertension   Coronary atherosclerosis   Fall   Renal failure (ARF), acute on chronic   Syncope and collapse   Discharge Condition: stable Diet recommendation: heart healthy diet  Filed Weights   05/06/14 0000 05/06/14 1115 05/07/14 0522  Weight: 70.308 kg (155 lb) 70.081 kg (154 lb 8 oz) 71.578 kg (157 lb 12.8 oz)    History of present illness:  Robert Ritter is a 80 y.o. male  With past medical history of coronary artery disease, hypertension, chronic kidney disease stage III, chronic diastolic dysfunction, who presented with syncopal episode. Patient states that he felt that his blood pressure dropped, and as he got up, he felt weak and fell to the floor. He denies loss of consciousness. Patient then got up and sustained some injuries to his head and right arm. He denies any prior episode of fall. Patient is on and ARB and Lasix 40mg  at home, which he takes only as needed. He states he took 2 doses of Lasix and the ARB when he measured his pressure and found it elevated.he denies any current dizziness, lightheadedness, chest pain, shortness of breath.   Hospital Course:   Syncope and collapse -Likely secondary to vasovagal response, patient is on multiple blood pressure medications. -Rule out intracranial events, patient has negative CT head, MRI of the brain not done, no lateralization symptoms, canceled. -Carotid duplex showed no significant stenosis, 40-59% on the left ICA. -2-D echo done in March 2015 showed normal ejection fraction of 65-70% with grade  1 diastolic dysfunction, was not repeated. -Orthostatic vitals showing severe orthostatic hypotension with blood pressure decreased from 166 to 115. -Orthostatic hypotension resolved, with IV fluids. -Patient walked around with physical therapy without problems. -orthostatic hypotension is likely secondary to use of diuretics and blood pressure medications, Lasix decreased to 40 mg twice a day and Micardis to 20 mg daily. To follow-up with PCP, might need further adjustment of blood pressure regimen.  Generalized weakness -Likely secondary to overall dehydration, patient does have low vitamin D levels. -Ask PT/OT to evaluate and treat.  Acute on chronic kidney disease, stage III -Baseline about 1.5, presented with creatinine of 2 upon admission. -Patient is on ARB, Lasix, both held while he was in the hospital. -Got gentle hydration with IV fluids, creatinine went down to 1.6 on discharge. -On discharge Lasix decreased to 40 mg twice a day and Micardis to 20 mg daily.  Chronic diastolic dysfunction -Appears euvolemic, obtain daily weights, currently holding Lasix. -Hydralazine as needed for high blood pressure.  HTN/CAD -Hold antihypertensive for the acute kidney injury. -continue aspirin.  BPH -Stable, continue Flomax.   Procedures:  None  Consultations:  None  Discharge Exam: Filed Vitals:   05/07/14 0522  BP: 160/59  Pulse: 62  Temp: 98.3 F (36.8 C)  Resp:    General: Alert and awake, oriented x3, not in any acute distress. HEENT: anicteric sclera, pupils reactive to light and accommodation, EOMI CVS: S1-S2 clear, no murmur rubs or gallops Chest: clear to auscultation bilaterally, no wheezing, rales or rhonchi Abdomen: soft nontender, nondistended, normal bowel sounds, no organomegaly Extremities:  no cyanosis, clubbing or edema noted bilaterally Neuro: Cranial nerves II-XII intact, no focal neurological deficits  Discharge Instructions You were cared for by a  hospitalist during your hospital stay. If you have any questions about your discharge medications or the care you received while you were in the hospital after you are discharged, you can call the unit and asked to speak with the hospitalist on call if the hospitalist that took care of you is not available. Once you are discharged, your primary care physician will handle any further medical issues. Please note that NO REFILLS for any discharge medications will be authorized once you are discharged, as it is imperative that you return to your primary care physician (or establish a relationship with a primary care physician if you do not have one) for your aftercare needs so that they can reassess your need for medications and monitor your lab values.  Discharge Instructions    Diet - low sodium heart healthy    Complete by:  As directed      Increase activity slowly    Complete by:  As directed           Current Discharge Medication List    START taking these medications   Details  tamsulosin (FLOMAX) 0.4 MG CAPS capsule Take 1 capsule (0.4 mg total) by mouth daily after supper. Qty: 30 capsule, Refills: 30      CONTINUE these medications which have CHANGED   Details  furosemide (LASIX) 40 MG tablet Take 1 tablet (40 mg total) by mouth 2 (two) times daily. Qty: 60 tablet, Refills: 0    telmisartan (MICARDIS) 20 MG tablet Take 1 tablet (20 mg total) by mouth daily. Qty: 30 tablet, Refills: 0      CONTINUE these medications which have NOT CHANGED   Details  aspirin 325 MG tablet Take 162.5 mg by mouth daily.    bimatoprost (LUMIGAN) 0.03 % ophthalmic drops Place 1 drop into both eyes at bedtime.      epoetin alfa (EPOGEN,PROCRIT) 68341 UNIT/ML injection Inject 20,000 Units into the skin every 28 (twenty-eight) days.    fish oil-omega-3 fatty acids 1000 MG capsule Take 1 g by mouth daily.     nitroGLYCERIN (NITROSTAT) 0.4 MG SL tablet Place 0.4 mg under the tongue as needed for chest  pain. Chest pain    psyllium (METAMUCIL) 58.6 % packet Take 1 packet by mouth daily.    simvastatin (ZOCOR) 20 MG tablet Take 1 tablet (20 mg total) by mouth daily. Qty: 90 tablet, Refills: 3   Associated Diagnoses: Hyperlipidemia    celecoxib (CELEBREX) 200 MG capsule Take 200 mg by mouth daily as needed for mild pain or moderate pain.     polyethylene glycol (MIRALAX / GLYCOLAX) packet Take 17 g by mouth daily as needed.      STOP taking these medications     terazosin (HYTRIN) 5 MG capsule      colchicine 0.6 MG tablet        Allergies  Allergen Reactions  . Sulfa Antibiotics Rash   Follow-up Information    Follow up with Walker Kehr, MD In 1 week.   Specialty:  Internal Medicine   Contact information:   [place] [address] [phone]        The results of significant diagnostics from this hospitalization (including imaging, microbiology, ancillary and laboratory) are listed below for reference.    Significant Diagnostic Studies: Dg Chest 2 View  05/05/2014  CLINICAL DATA:  Pt has hx of orthostatic changes; found unconscious by wife in kitchen. Wife reports passed out for 3-5 mins. Pt has ungoing hypotension issues and has been told by cardiologist to eat salty food when he feels it gets low. Pt was trying to fix some soup at the time. Lacerations to head and skin tears bilaterally on arms. Hx of 5 stents and AAA. Denies chest pain. States "just weak"; denies pain.  EXAM: CHEST  2 VIEW  COMPARISON:  10/08/2013  FINDINGS: Cardiopericardial silhouette is mildly enlarged. Changes from previous cardiac surgery are stable.  No mediastinal or hilar masses or evidence of adenopathy.  Lungs are clear.  No pleural effusion or pneumothorax.  Bony thorax is demineralized but grossly intact.  IMPRESSION: No acute cardiopulmonary disease.   Electronically Signed   By: Lajean Manes M.D.   On: 05/05/2014 11:27   Dg Pelvis 1-2 Views  05/05/2014   CLINICAL DATA:   Pt has hx of orthostatic changes; found unconscious by wife in kitchen. Wife reports passed out for 3-5 mins. Pt has ungoing hypotension issues and has been told by cardiologist to eat salty food when he feels it gets low. Pt was trying to fix some soup at the time. Lacerations to head and skin tears bilaterally on arms. Hx of 5 stents and AAA. Denies chest pain. States "just weak"; denies pain.  EXAM: PELVIS - 1-2 VIEW  COMPARISON:  CT, 04/23/2014  FINDINGS: No fracture. No bone lesion. The hip joints, SI joints and symphysis pubis are normally space and aligned. No significant arthropathic change. The bones are diffusely demineralized.  Aortoiliac vascular stents are stable from the prior CT. A coil mass lies in the right upper pelvis, also unchanged. Vascular calcifications extend along the iliac and femoral arteries.  IMPRESSION: 1. No acute findings.  No fracture or dislocation.   Electronically Signed   By: Lajean Manes M.D.   On: 05/05/2014 11:28   Ct Head Wo Contrast  05/05/2014   CLINICAL DATA:  80 yo, fall, syncope. Head injury. Found unconscious on floor. Loss of consciousness for 3-5 min. Lacerations to head.  EXAM: CT HEAD WITHOUT CONTRAST  CT CERVICAL SPINE WITHOUT CONTRAST  TECHNIQUE: Multidetector CT imaging of the head and cervical spine was performed following the standard protocol without intravenous contrast. Multiplanar CT image reconstructions of the cervical spine were also generated.  COMPARISON:  C-spine CT 09/14/2013  FINDINGS: CT HEAD FINDINGS  There is atrophy and chronic small vessel disease changes. Old bilateral basal ganglia lacunar infarcts. No acute intracranial abnormality. Specifically, no hemorrhage, hydrocephalus, mass lesion, acute infarction, or significant intracranial injury. No acute calvarial abnormality.  Mucosal thickening within the paranasal sinuses. No air-fluid levels.  CT CERVICAL SPINE FINDINGS  Advanced degenerative disc disease throughout the cervical spine.  Bilateral degenerative facet disease. Slight levoscoliosis of the cervical spine which could be positional. Prevertebral soft tissues are normal. No epidural or paraspinal hematoma. No fracture.  IMPRESSION: No acute intracranial abnormality.  No acute bony abnormality in the cervical spine   Electronically Signed   By: Rolm Baptise M.D.   On: 05/05/2014 11:50   Ct Cervical Spine Wo Contrast  05/05/2014   CLINICAL DATA:  80 yo, fall, syncope. Head injury. Found unconscious on floor. Loss of consciousness for 3-5 min. Lacerations to head.  EXAM: CT HEAD WITHOUT CONTRAST  CT CERVICAL SPINE WITHOUT CONTRAST  TECHNIQUE: Multidetector CT imaging of the head and cervical spine was performed following the standard protocol  without intravenous contrast. Multiplanar CT image reconstructions of the cervical spine were also generated.  COMPARISON:  C-spine CT 09/14/2013  FINDINGS: CT HEAD FINDINGS  There is atrophy and chronic small vessel disease changes. Old bilateral basal ganglia lacunar infarcts. No acute intracranial abnormality. Specifically, no hemorrhage, hydrocephalus, mass lesion, acute infarction, or significant intracranial injury. No acute calvarial abnormality.  Mucosal thickening within the paranasal sinuses. No air-fluid levels.  CT CERVICAL SPINE FINDINGS  Advanced degenerative disc disease throughout the cervical spine. Bilateral degenerative facet disease. Slight levoscoliosis of the cervical spine which could be positional. Prevertebral soft tissues are normal. No epidural or paraspinal hematoma. No fracture.  IMPRESSION: No acute intracranial abnormality.  No acute bony abnormality in the cervical spine   Electronically Signed   By: Rolm Baptise M.D.   On: 05/05/2014 11:50   Ct Angio Abd/pel W/ And/or W/o  04/23/2014   CLINICAL DATA:  Abdominal aortic aneurysm without rupture. Aftercare following surgery of the circulatory system.  EXAM: CTA ABDOMEN AND PELVIS wITHOUT AND WITH CONTRAST  TECHNIQUE:  Multidetector CT imaging of the abdomen and pelvis was performed using the standard protocol during bolus administration of intravenous contrast. Multiplanar reconstructed images and MIPs were obtained and reviewed to evaluate the vascular anatomy.  CONTRAST:  64mL OMNIPAQUE IOHEXOL 350 MG/ML SOLN  COMPARISON:  CT scan of Nov 20, 2013.  FINDINGS: No significant abnormality is noted in the visualized lung bases. No significant osseous abnormality is noted.  No definite abnormality is noted in the liver, spleen or pancreas. No gallstones are noted. Adrenal glands appear normal. 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 Stable type 2 endoleak is seen in the aneurysmal sac compared to prior exam. This most likely is related to retrograde flow from the inferior mesenteric artery.  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 Renal arteries are widely patent. Superior mesenteric artery is widely patent. Aneurysmal sac measures 5.8 x 5.7 cm in size which is slightly enlarged compared to prior exam. Distal portion of right limb of graft is seen in right external iliac artery. The patient has undergone coil embolization of the right internal iliac artery. Distal end of left limb is in the left common iliac artery. These limbs and iliac vessels are widely patent without significant stenosis.  Moderate right inguinal hernia is noted which contains loops of small bowel, but does not result in incarceration or obstruction. Severe prostatic enlargement is noted which is unchanged. Urinary bladder appears normal.  Review of the MIP images confirms the above findings.  IMPRESSION: Status post endograft repair of abdominal aortic aneurysm. Stable type 2 endoleak is noted in the aneurysmal sac along the right and anterior site, most likely due to retrograde flow from patent inferior mesenteric artery.  Aneurysmal sac now measures 5.8 x 5.7 cm in size, which is slightly enlarged  compared to prior exam.  Moderate size right inguinal hernia is again noted, but it now contains loops of small bowel. However, no evidence of bowel incarceration or obstruction is noted.   Electronically Signed   By: Sabino Dick M.D.   On: 04/23/2014 12:04    Microbiology: No results found for this or any previous visit (from the past 240 hour(s)).   Labs: Basic Metabolic Panel:  Recent Labs Lab 05/05/14 1005 05/06/14 0447 05/07/14 0320  NA 140 141 139  K 4.4 4.4 4.3  CL 102 106 105  CO2 24 23 22   GLUCOSE 113* 92 85  BUN 31* 30* 26*  CREATININE 2.00* 1.79* 1.64*  CALCIUM 8.8 8.2* 8.2*   Liver Function Tests:  Recent Labs Lab 05/05/14 1005  AST 16  ALT 9  ALKPHOS 72  BILITOT 0.4  PROT 6.9  ALBUMIN 3.3*   No results for input(s): LIPASE, AMYLASE in the last 168 hours. No results for input(s): AMMONIA in the last 168 hours. CBC:  Recent Labs Lab 05/05/14 1005 05/06/14 0447  WBC 6.4 7.2  NEUTROABS 4.8  --   HGB 10.6* 10.2*  HCT 33.4* 32.1*  MCV 91.8 89.4  PLT 171 180   Cardiac Enzymes: No results for input(s): CKTOTAL, CKMB, CKMBINDEX, TROPONINI in the last 168 hours. BNP: BNP (last 3 results) No results for input(s): PROBNP in the last 8760 hours. CBG: No results for input(s): GLUCAP in the last 168 hours.     Signed:  Shantel Helwig A  [facility] 05/07/2014, 10:41 AM

## 2014-05-09 LAB — VITAMIN D 1,25 DIHYDROXY
Vitamin D 1, 25 (OH)2 Total: 33 pg/mL (ref 18–72)
Vitamin D3 1, 25 (OH)2: 33 pg/mL

## 2014-05-11 ENCOUNTER — Telehealth: Payer: Self-pay | Admitting: Internal Medicine

## 2014-05-11 NOTE — Telephone Encounter (Signed)
Ok to w/in [employer]

## 2014-05-11 NOTE — Telephone Encounter (Signed)
Pt had a really back fall last week and was in hospital for 2 days, he is suppose follow up with dr plot this week. Can we work him in anywhere?

## 2014-05-12 NOTE — Telephone Encounter (Signed)
Transition Care Management Follow-up Telephone Call D/c 05/07/14  How have you been since you were released from the hospital? Pt stated he is doing OK   Do you understand why you were in the hospital? YES he understood why he was admitted   Do you understand the discharge instrcutions? YES, per wife they understood d/c summary  Items Reviewed:  Medications reviewed: YES, reviewed  Allergies reviewed: YES, reviewed  Dietary changes reviewed:Heart healthy  Referrals reviewed: NO  Functional Questionnaire:   Activities of Daily Living (ADLs):   Wife states he is independent with the following walking, dressing himself, bathing ect.   Wife stated no assistance is needed   Any transportation issues/concerns?: NO   Any patient concerns? NO  Confirmed importance and date/time of follow-up visits scheduled: YES, md ok pt to be work in for tomorrow 05/13/14   Confirmed with patient if condition begins to worsen call PCP or go to the ER.  Patient was given the Call-a-Nurse line [phone]: YES

## 2014-05-13 ENCOUNTER — Encounter: Payer: Self-pay | Admitting: Internal Medicine

## 2014-05-13 ENCOUNTER — Ambulatory Visit (INDEPENDENT_AMBULATORY_CARE_PROVIDER_SITE_OTHER): Payer: Medicare Other | Admitting: Internal Medicine

## 2014-05-13 VITALS — BP 120/50 | HR 71 | Temp 98.0°F | Ht 70.0 in | Wt 156.4 lb

## 2014-05-13 DIAGNOSIS — R55 Syncope and collapse: Secondary | ICD-10-CM

## 2014-05-13 DIAGNOSIS — I714 Abdominal aortic aneurysm, without rupture, unspecified: Secondary | ICD-10-CM

## 2014-05-13 DIAGNOSIS — I10 Essential (primary) hypertension: Secondary | ICD-10-CM

## 2014-05-13 DIAGNOSIS — E785 Hyperlipidemia, unspecified: Secondary | ICD-10-CM

## 2014-05-13 NOTE — Assessment & Plan Note (Signed)
Continue with current prescription therapy as reflected on the Med list.  

## 2014-05-13 NOTE — Progress Notes (Signed)
Subjective:    HPI Post-hosp f/u:  Admit date: 05/05/2014 Discharge date: 05/07/2014   Feeling ok  Discharge Diagnoses:  Principal Problem:  Syncope Active Problems:  Hyperlipidemia  Gout  Anemia  Essential hypertension  Coronary atherosclerosis  Fall  Renal failure (ARF), acute on chronic  Syncope and collapse   Discharge Condition: stable Diet recommendation: heart healthy diet  Filed Weights   05/06/14 0000 05/06/14 1115 05/07/14 0522  Weight: 70.308 kg (155 lb) 70.081 kg (154 lb 8 oz) 71.578 kg (157 lb 12.8 oz)    History of present illness:  Robert Ritter is a 80 y.o. male  With past medical history of coronary artery disease, hypertension, chronic kidney disease stage III, chronic diastolic dysfunction, who presented with syncopal episode. Patient states that he felt that his blood pressure dropped, and as he got up, he felt weak and fell to the floor. He denies loss of consciousness. Patient then got up and sustained some injuries to his head and right arm. He denies any prior episode of fall. Patient is on and ARB and Lasix 40mg  at home, which he takes only as needed. He states he took 2 doses of Lasix and the ARB when he measured his pressure and found it elevated.he denies any current dizziness, lightheadedness, chest pain, shortness of breath.   Hospital Course:   Syncope and collapse -Likely secondary to vasovagal response, patient is on multiple blood pressure medications. -Rule out intracranial events, patient has negative CT head, MRI of the brain not done, no lateralization symptoms, canceled. -Carotid duplex showed no significant stenosis, 40-59% on the left ICA. -2-D echo done in March 2015 showed normal ejection fraction of 65-70% with grade 1 diastolic dysfunction, was not repeated. -Orthostatic vitals showing severe orthostatic hypotension with blood pressure decreased from 166 to 115. -Orthostatic hypotension  resolved, with IV fluids. -Patient walked around with physical therapy without problems. -orthostatic hypotension is likely secondary to use of diuretics and blood pressure medications, Lasix decreased to 40 mg twice a day and Micardis to 20 mg daily. To follow-up with PCP, might need further adjustment of blood pressure regimen.  Generalized weakness -Likely secondary to overall dehydration, patient does have low vitamin D levels. -Ask PT/OT to evaluate and treat.  Acute on chronic kidney disease, stage III -Baseline about 1.5, presented with creatinine of 2 upon admission. -Patient is on ARB, Lasix, both held while he was in the hospital. -Got gentle hydration with IV fluids, creatinine went down to 1.6 on discharge. -On discharge Lasix decreased to 40 mg twice a day and Micardis to 20 mg daily.  Chronic diastolic dysfunction -Appears euvolemic, obtain daily weights, currently holding Lasix. -Hydralazine as needed for high blood pressure.  HTN/CAD -Hold antihypertensive for the acute kidney injury. -continue aspirin.  BPH -Stable, continue Flomax.    The patient presents for a follow-up of  chronic hypertension, chronic dyslipidemia, gout controlled with medicines. Doing well, weak since 4/15  F/u AAA, BPH  He underwent placement of a Gore Excluder aneurysm stent graft on 10/08/2013. 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     BP Readings from Last 3 Encounters:  05/13/14 120/50  05/07/14 160/59  04/23/14 157/59   Wt Readings from Last 3 Encounters:  05/13/14 156 lb 6 oz (70.931 kg)  05/07/14 157 lb 12.8 oz (71.578 kg)  04/23/14 159 lb 6.4 oz (72.303 kg)   Review of Systems  Constitutional: Negative for appetite  change, fatigue and unexpected weight change.  HENT: Negative for congestion, nosebleeds, sneezing, sore throat and trouble swallowing.   Eyes: Negative for itching and visual disturbance.  Respiratory:  Negative for cough.   Cardiovascular: Negative for chest pain, palpitations and leg swelling.  Gastrointestinal: Negative for nausea, diarrhea, blood in stool and abdominal distention.  Genitourinary: Negative for frequency and hematuria.  Musculoskeletal: Positive for joint swelling, arthralgias and gait problem. Negative for back pain and neck pain.       Stiff  Skin: Negative for rash.  Neurological: Negative for dizziness, tremors, speech difficulty and weakness.  Psychiatric/Behavioral: Negative for suicidal ideas, sleep disturbance, dysphoric mood and agitation. The patient is not nervous/anxious.        Objective:   Physical Exam  Constitutional: He is oriented to person, place, and time. He appears well-developed. No distress.  NAD  HENT:  Mouth/Throat: Oropharynx is clear and moist.  Eyes: Conjunctivae are normal. Pupils are equal, round, and reactive to light.  Neck: Normal range of motion. No JVD present. No thyromegaly present.  Cardiovascular: Normal rate, regular rhythm, normal heart sounds and intact distal pulses.  Exam reveals no gallop and no friction rub.   No murmur heard. Pulmonary/Chest: Effort normal and breath sounds normal. No respiratory distress. He has no wheezes. He has no rales. He exhibits no tenderness.  Abdominal: Soft. Bowel sounds are normal. He exhibits no distension and no mass. There is no tenderness. There is no rebound and no guarding.  Musculoskeletal: Normal range of motion. He exhibits no edema or tenderness.  Lymphadenopathy:    He has no cervical adenopathy.  Neurological: He is alert and oriented to person, place, and time. He has normal reflexes. No cranial nerve deficit. He exhibits normal muscle tone. He displays a negative Romberg sign. Coordination and gait normal.  No meningeal signs  Skin: Skin is warm and dry. No rash noted.  Psychiatric: He has a normal mood and affect. His behavior is normal. Judgment and thought content normal.   Abrasions on face - healing Str leg elev is neg B    . Lab Results  Component Value Date   WBC 7.2 05/06/2014   HGB 10.2* 05/06/2014   HCT 32.1* 05/06/2014   PLT 180 05/06/2014   GLUCOSE 85 05/07/2014   CHOL 91 02/18/2013   TRIG 59.0 02/18/2013   HDL 39.90 02/18/2013   LDLCALC 39 02/18/2013   ALT 9 05/05/2014   AST 16 05/05/2014   NA 139 05/07/2014   K 4.3 05/07/2014   CL 105 05/07/2014   CREATININE 1.64* 05/07/2014   BUN 26* 05/07/2014   CO2 22 05/07/2014   TSH 2.640 05/05/2014   PSA 13.73* 06/16/2011   INR 1.16 10/08/2013         Assessment & Plan:

## 2014-05-13 NOTE — Assessment & Plan Note (Signed)
11/15 vaso-vagal - hosp w/up Cont to monitor - feeling good

## 2014-05-13 NOTE — Assessment & Plan Note (Addendum)
Doing well s/p repair: He underwent placement of a Gore Excluder aneurysm stent graft on 10/08/2013. 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  Continue with current prescription therapy as reflected on the Med list.

## 2014-05-13 NOTE — Progress Notes (Signed)
Pre visit review using our clinic review tool, if applicable. No additional management support is needed unless otherwise documented below in the visit note. 

## 2014-05-19 ENCOUNTER — Encounter (HOSPITAL_COMMUNITY): Payer: Medicare Other

## 2014-05-25 ENCOUNTER — Encounter (HOSPITAL_COMMUNITY)
Admission: RE | Admit: 2014-05-25 | Discharge: 2014-05-25 | Disposition: A | Discharge status: Home / Self Care | Payer: Medicare Other | Source: Ambulatory Visit | Attending: Nephrology | Admitting: Nephrology

## 2014-05-25 ENCOUNTER — Other Ambulatory Visit (INDEPENDENT_AMBULATORY_CARE_PROVIDER_SITE_OTHER): Payer: Medicare Other

## 2014-05-25 DIAGNOSIS — D631 Anemia in chronic kidney disease: Secondary | ICD-10-CM | POA: Diagnosis present

## 2014-05-25 DIAGNOSIS — I714 Abdominal aortic aneurysm, without rupture, unspecified: Secondary | ICD-10-CM

## 2014-05-25 DIAGNOSIS — N183 Chronic kidney disease, stage 3 (moderate): Secondary | ICD-10-CM | POA: Diagnosis not present

## 2014-05-25 DIAGNOSIS — R55 Syncope and collapse: Secondary | ICD-10-CM

## 2014-05-25 DIAGNOSIS — I10 Essential (primary) hypertension: Secondary | ICD-10-CM

## 2014-05-25 LAB — BASIC METABOLIC PANEL
BUN: 36 mg/dL — AB (ref 6–23)
CO2: 22 meq/L (ref 19–32)
CREATININE: 1.8 mg/dL — AB (ref 0.4–1.5)
Calcium: 8.2 mg/dL — ABNORMAL LOW (ref 8.4–10.5)
Chloride: 107 mEq/L (ref 96–112)
GFR: 38.94 mL/min — AB (ref >60.00)
Glucose, Bld: 119 mg/dL — ABNORMAL HIGH (ref 70–99)
Potassium: 3.7 mEq/L (ref 3.5–5.1)
SODIUM: 137 meq/L (ref 135–145)

## 2014-05-25 LAB — CBC WITH DIFFERENTIAL/PLATELET
Basophils Absolute: 0 10*3/uL (ref 0.0–0.1)
Basophils Relative: 0.4 % (ref 0.0–3.0)
EOS PCT: 8.8 % — AB (ref 0.0–5.0)
Eosinophils Absolute: 0.6 10*3/uL (ref 0.0–0.7)
HEMATOCRIT: 30.8 % — AB (ref 39.0–52.0)
HEMOGLOBIN: 10 g/dL — AB (ref 13.0–17.0)
LYMPHS ABS: 0.8 10*3/uL (ref 0.7–4.0)
LYMPHS PCT: 12.4 % (ref 12.0–46.0)
MCHC: 32.4 g/dL (ref 30.0–36.0)
MCV: 88.8 fl (ref 78.0–100.0)
MONOS PCT: 7.4 % (ref 3.0–12.0)
Monocytes Absolute: 0.5 10*3/uL (ref 0.1–1.0)
NEUTROS ABS: 4.5 10*3/uL (ref 1.4–7.7)
Neutrophils Relative %: 71 % (ref 43.0–77.0)
Platelets: 187 10*3/uL (ref 150.0–400.0)
RBC: 3.47 Mil/uL — AB (ref 4.22–5.81)
RDW: 15.9 % — AB (ref 11.5–15.5)
WBC: 6.4 10*3/uL (ref 4.0–10.5)

## 2014-05-25 LAB — HEPATIC FUNCTION PANEL
ALK PHOS: 63 U/L (ref 39–117)
ALT: 10 U/L (ref 0–53)
AST: 14 U/L (ref 0–37)
Albumin: 3.4 g/dL — ABNORMAL LOW (ref 3.5–5.2)
Bilirubin, Direct: 0.1 mg/dL (ref 0.0–0.3)
Total Bilirubin: 0.4 mg/dL (ref 0.2–1.2)
Total Protein: 6.7 g/dL (ref 6.0–8.3)

## 2014-05-25 LAB — POCT HEMOGLOBIN-HEMACUE: Hemoglobin: 11.7 g/dL — ABNORMAL LOW (ref 13.0–17.0)

## 2014-05-25 MED ORDER — EPOETIN ALFA 20000 UNIT/ML IJ SOLN
INTRAMUSCULAR | Status: AC
  Administered 2014-05-25: 20000 [IU] via SUBCUTANEOUS
  Filled 2014-05-25: qty 1

## 2014-05-25 MED ORDER — EPOETIN ALFA 20000 UNIT/ML IJ SOLN: 20000.0000 [IU] | INTRAMUSCULAR | Status: DC

## 2014-05-26 ENCOUNTER — Encounter: Payer: Self-pay | Admitting: Internal Medicine

## 2014-05-26 ENCOUNTER — Ambulatory Visit (INDEPENDENT_AMBULATORY_CARE_PROVIDER_SITE_OTHER): Payer: Medicare Other | Admitting: Internal Medicine

## 2014-05-26 VITALS — BP 118/58 | HR 66 | Temp 97.7°F | Wt 157.0 lb

## 2014-05-26 DIAGNOSIS — M1009 Idiopathic gout, multiple sites: Secondary | ICD-10-CM

## 2014-05-26 DIAGNOSIS — I2510 Atherosclerotic heart disease of native coronary artery without angina pectoris: Secondary | ICD-10-CM

## 2014-05-26 DIAGNOSIS — E785 Hyperlipidemia, unspecified: Secondary | ICD-10-CM

## 2014-05-26 DIAGNOSIS — R55 Syncope and collapse: Secondary | ICD-10-CM

## 2014-05-26 DIAGNOSIS — L570 Actinic keratosis: Secondary | ICD-10-CM

## 2014-05-26 MED ORDER — HYDROCODONE-ACETAMINOPHEN 5-325 MG PO TABS: 1.0000 | ORAL_TABLET | Freq: Four times a day (QID) | ORAL | Status: AC | PRN

## 2014-05-26 NOTE — Progress Notes (Signed)
Pre visit review using our clinic review tool, if applicable. No additional management support is needed unless otherwise documented below in the visit note. 

## 2014-05-26 NOTE — Assessment & Plan Note (Signed)
F/u w/Dr Sarajane Jews

## 2014-05-26 NOTE — Assessment & Plan Note (Signed)
Cont w/Simvastatin

## 2014-05-26 NOTE — Assessment & Plan Note (Signed)
No relapse 

## 2014-05-26 NOTE — Assessment & Plan Note (Signed)
Continue with current prescription therapy as reflected on the Med list.  

## 2014-05-26 NOTE — Progress Notes (Signed)
Subjective:    HPI     The patient presents for a follow-up of  chronic hypertension, chronic dyslipidemia controlled with medicines. Doing well, weak since 4/15  F/u AAA, BPH  He underwent placement of a Gore Excluder aneurysm stent graft on 10/08/2013. 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   C/o rare gout  BP Readings from Last 3 Encounters:  05/26/14 118/58  05/25/14 134/51  05/13/14 120/50   Wt Readings from Last 3 Encounters:  05/26/14 157 lb (71.215 kg)  05/13/14 156 lb 6 oz (70.931 kg)  05/07/14 157 lb 12.8 oz (71.578 kg)   Review of Systems  Constitutional: Negative for appetite change, fatigue and unexpected weight change.  HENT: Negative for congestion, nosebleeds, sneezing, sore throat and trouble swallowing.   Eyes: Negative for itching and visual disturbance.  Respiratory: Negative for cough.   Cardiovascular: Negative for chest pain, palpitations and leg swelling.  Gastrointestinal: Negative for nausea, diarrhea, blood in stool and abdominal distention.  Genitourinary: Negative for frequency and hematuria.  Musculoskeletal: Positive for joint swelling, arthralgias and gait problem. Negative for back pain and neck pain.       Stiff  Skin: Negative for rash.  Neurological: Negative for dizziness, tremors, speech difficulty and weakness.  Psychiatric/Behavioral: Negative for suicidal ideas, sleep disturbance, dysphoric mood and agitation. The patient is not nervous/anxious.        Objective:   Physical Exam  Constitutional: He is oriented to person, place, and time. He appears well-developed. No distress.  NAD  HENT:  Mouth/Throat: Oropharynx is clear and moist.  Eyes: Conjunctivae are normal. Pupils are equal, round, and reactive to light.  Neck: Normal range of motion. No JVD present. No thyromegaly present.  Cardiovascular: Normal rate, regular rhythm, normal heart sounds and intact distal pulses.   Exam reveals no gallop and no friction rub.   No murmur heard. Pulmonary/Chest: Effort normal and breath sounds normal. No respiratory distress. He has no wheezes. He has no rales. He exhibits no tenderness.  Abdominal: Soft. Bowel sounds are normal. He exhibits no distension and no mass. There is no tenderness. There is no rebound and no guarding.  Musculoskeletal: Normal range of motion. He exhibits no edema or tenderness.  Lymphadenopathy:    He has no cervical adenopathy.  Neurological: He is alert and oriented to person, place, and time. He has normal reflexes. No cranial nerve deficit. He exhibits normal muscle tone. He displays a negative Romberg sign. Coordination and gait normal.  No meningeal signs  Skin: Skin is warm and dry. No rash noted.  Psychiatric: He has a normal mood and affect. His behavior is normal. Judgment and thought content normal.  AKs are ok Str leg elev is neg B    . Lab Results  Component Value Date   WBC 6.4 05/25/2014   HGB 10.0* 05/25/2014   HCT 30.8* 05/25/2014   PLT 187.0 05/25/2014   GLUCOSE 119* 05/25/2014   CHOL 91 02/18/2013   TRIG 59.0 02/18/2013   HDL 39.90 02/18/2013   LDLCALC 39 02/18/2013   ALT 10 05/25/2014   AST 14 05/25/2014   NA 137 05/25/2014   K 3.7 05/25/2014   CL 107 05/25/2014   CREATININE 1.8* 05/25/2014   BUN 36* 05/25/2014   CO2 22 05/25/2014   TSH 2.640 05/05/2014   PSA 13.73* 06/16/2011   INR 1.16 10/08/2013         Assessment & Plan:

## 2014-05-26 NOTE — Assessment & Plan Note (Signed)
Norco prn  Potential benefits of a long term opioids use as well as potential risks (i.e. addiction risk, apnea etc) and complications (i.e. Somnolence, constipation and others) were explained to the patient and were aknowledged. 

## 2014-06-05 DIAGNOSIS — R55 Syncope and collapse: Secondary | ICD-10-CM

## 2014-06-05 DIAGNOSIS — I714 Abdominal aortic aneurysm, without rupture: Secondary | ICD-10-CM

## 2014-06-05 DIAGNOSIS — E785 Hyperlipidemia, unspecified: Secondary | ICD-10-CM

## 2014-06-05 DIAGNOSIS — I10 Essential (primary) hypertension: Secondary | ICD-10-CM

## 2014-06-22 ENCOUNTER — Encounter (HOSPITAL_COMMUNITY)
Admission: RE | Admit: 2014-06-22 | Discharge: 2014-06-22 | Disposition: A | Discharge status: Home / Self Care | Payer: Medicare Other | Source: Ambulatory Visit | Attending: Nephrology | Admitting: Nephrology

## 2014-06-22 DIAGNOSIS — D631 Anemia in chronic kidney disease: Secondary | ICD-10-CM | POA: Insufficient documentation

## 2014-06-22 DIAGNOSIS — N183 Chronic kidney disease, stage 3 (moderate): Secondary | ICD-10-CM | POA: Diagnosis not present

## 2014-06-22 MED ORDER — EPOETIN ALFA 20000 UNIT/ML IJ SOLN
INTRAMUSCULAR | Status: AC
  Administered 2014-06-22: 20000 [IU] via SUBCUTANEOUS
  Filled 2014-06-22: qty 1

## 2014-06-22 MED ORDER — EPOETIN ALFA 20000 UNIT/ML IJ SOLN
20000.0000 [IU] | INTRAMUSCULAR | Status: DC
  Administered 2014-06-22: 20000 [IU] via SUBCUTANEOUS

## 2014-06-26 ENCOUNTER — Inpatient Hospital Stay (HOSPITAL_COMMUNITY): Payer: Medicare Other

## 2014-06-26 ENCOUNTER — Encounter (HOSPITAL_COMMUNITY): Payer: Self-pay | Admitting: *Deleted

## 2014-06-26 ENCOUNTER — Encounter (HOSPITAL_COMMUNITY): Admission: EM | Disposition: E | Payer: Medicare Other | Source: Home / Self Care | Attending: Cardiovascular Disease

## 2014-06-26 ENCOUNTER — Ambulatory Visit (HOSPITAL_COMMUNITY): Admit: 2014-06-26 | Payer: Self-pay | Admitting: Cardiovascular Disease

## 2014-06-26 ENCOUNTER — Inpatient Hospital Stay (HOSPITAL_COMMUNITY)
Admission: EM | Admit: 2014-06-26 | Discharge: 2014-07-27 | DRG: 246 | Disposition: E | Payer: Medicare Other | Attending: Cardiovascular Disease | Admitting: Cardiovascular Disease

## 2014-06-26 ENCOUNTER — Emergency Department (HOSPITAL_COMMUNITY): Payer: Medicare Other

## 2014-06-26 DIAGNOSIS — I5041 Acute combined systolic (congestive) and diastolic (congestive) heart failure: Secondary | ICD-10-CM | POA: Diagnosis present

## 2014-06-26 DIAGNOSIS — J9601 Acute respiratory failure with hypoxia: Secondary | ICD-10-CM | POA: Diagnosis present

## 2014-06-26 DIAGNOSIS — Z9861 Coronary angioplasty status: Secondary | ICD-10-CM

## 2014-06-26 DIAGNOSIS — I2102 ST elevation (STEMI) myocardial infarction involving left anterior descending coronary artery: Secondary | ICD-10-CM

## 2014-06-26 DIAGNOSIS — J189 Pneumonia, unspecified organism: Secondary | ICD-10-CM

## 2014-06-26 DIAGNOSIS — Z8249 Family history of ischemic heart disease and other diseases of the circulatory system: Secondary | ICD-10-CM

## 2014-06-26 DIAGNOSIS — Z978 Presence of other specified devices: Secondary | ICD-10-CM

## 2014-06-26 DIAGNOSIS — I714 Abdominal aortic aneurysm, without rupture: Secondary | ICD-10-CM | POA: Diagnosis present

## 2014-06-26 DIAGNOSIS — Z87891 Personal history of nicotine dependence: Secondary | ICD-10-CM | POA: Diagnosis not present

## 2014-06-26 DIAGNOSIS — I2109 ST elevation (STEMI) myocardial infarction involving other coronary artery of anterior wall: Secondary | ICD-10-CM | POA: Diagnosis present

## 2014-06-26 DIAGNOSIS — I129 Hypertensive chronic kidney disease with stage 1 through stage 4 chronic kidney disease, or unspecified chronic kidney disease: Secondary | ICD-10-CM | POA: Diagnosis present

## 2014-06-26 DIAGNOSIS — N170 Acute kidney failure with tubular necrosis: Secondary | ICD-10-CM | POA: Diagnosis present

## 2014-06-26 DIAGNOSIS — E872 Acidosis, unspecified: Secondary | ICD-10-CM | POA: Diagnosis present

## 2014-06-26 DIAGNOSIS — Y832 Surgical operation with anastomosis, bypass or graft as the cause of abnormal reaction of the patient, or of later complication, without mention of misadventure at the time of the procedure: Secondary | ICD-10-CM | POA: Diagnosis present

## 2014-06-26 DIAGNOSIS — I4901 Ventricular fibrillation: Secondary | ICD-10-CM | POA: Diagnosis present

## 2014-06-26 DIAGNOSIS — I4729 Other ventricular tachycardia: Secondary | ICD-10-CM

## 2014-06-26 DIAGNOSIS — R0902 Hypoxemia: Secondary | ICD-10-CM

## 2014-06-26 DIAGNOSIS — I351 Nonrheumatic aortic (valve) insufficiency: Secondary | ICD-10-CM

## 2014-06-26 DIAGNOSIS — W1830XA Fall on same level, unspecified, initial encounter: Secondary | ICD-10-CM | POA: Diagnosis present

## 2014-06-26 DIAGNOSIS — S0091XA Abrasion of unspecified part of head, initial encounter: Secondary | ICD-10-CM | POA: Diagnosis present

## 2014-06-26 DIAGNOSIS — Z7982 Long term (current) use of aspirin: Secondary | ICD-10-CM | POA: Diagnosis not present

## 2014-06-26 DIAGNOSIS — I2510 Atherosclerotic heart disease of native coronary artery without angina pectoris: Secondary | ICD-10-CM | POA: Diagnosis present

## 2014-06-26 DIAGNOSIS — Z85828 Personal history of other malignant neoplasm of skin: Secondary | ICD-10-CM

## 2014-06-26 DIAGNOSIS — J9600 Acute respiratory failure, unspecified whether with hypoxia or hypercapnia: Secondary | ICD-10-CM | POA: Insufficient documentation

## 2014-06-26 DIAGNOSIS — I472 Ventricular tachycardia: Secondary | ICD-10-CM | POA: Diagnosis present

## 2014-06-26 DIAGNOSIS — Z7902 Long term (current) use of antithrombotics/antiplatelets: Secondary | ICD-10-CM | POA: Diagnosis not present

## 2014-06-26 DIAGNOSIS — E785 Hyperlipidemia, unspecified: Secondary | ICD-10-CM | POA: Diagnosis present

## 2014-06-26 DIAGNOSIS — R410 Disorientation, unspecified: Secondary | ICD-10-CM | POA: Insufficient documentation

## 2014-06-26 DIAGNOSIS — Z79899 Other long term (current) drug therapy: Secondary | ICD-10-CM

## 2014-06-26 DIAGNOSIS — Z515 Encounter for palliative care: Secondary | ICD-10-CM | POA: Insufficient documentation

## 2014-06-26 DIAGNOSIS — R0600 Dyspnea, unspecified: Secondary | ICD-10-CM | POA: Insufficient documentation

## 2014-06-26 DIAGNOSIS — G9341 Metabolic encephalopathy: Secondary | ICD-10-CM | POA: Diagnosis present

## 2014-06-26 DIAGNOSIS — I429 Cardiomyopathy, unspecified: Secondary | ICD-10-CM | POA: Diagnosis present

## 2014-06-26 DIAGNOSIS — I213 ST elevation (STEMI) myocardial infarction of unspecified site: Secondary | ICD-10-CM | POA: Diagnosis present

## 2014-06-26 DIAGNOSIS — N183 Chronic kidney disease, stage 3 unspecified: Secondary | ICD-10-CM | POA: Diagnosis present

## 2014-06-26 DIAGNOSIS — R0689 Other abnormalities of breathing: Secondary | ICD-10-CM

## 2014-06-26 DIAGNOSIS — I219 Acute myocardial infarction, unspecified: Secondary | ICD-10-CM | POA: Insufficient documentation

## 2014-06-26 DIAGNOSIS — I252 Old myocardial infarction: Secondary | ICD-10-CM | POA: Diagnosis not present

## 2014-06-26 DIAGNOSIS — K7200 Acute and subacute hepatic failure without coma: Secondary | ICD-10-CM | POA: Diagnosis present

## 2014-06-26 DIAGNOSIS — I10 Essential (primary) hypertension: Secondary | ICD-10-CM | POA: Diagnosis present

## 2014-06-26 DIAGNOSIS — I2582 Chronic total occlusion of coronary artery: Secondary | ICD-10-CM | POA: Diagnosis present

## 2014-06-26 DIAGNOSIS — N179 Acute kidney failure, unspecified: Secondary | ICD-10-CM | POA: Diagnosis present

## 2014-06-26 DIAGNOSIS — K117 Disturbances of salivary secretion: Secondary | ICD-10-CM | POA: Insufficient documentation

## 2014-06-26 DIAGNOSIS — J690 Pneumonitis due to inhalation of food and vomit: Secondary | ICD-10-CM | POA: Diagnosis present

## 2014-06-26 DIAGNOSIS — T508X5A Adverse effect of diagnostic agents, initial encounter: Secondary | ICD-10-CM | POA: Diagnosis not present

## 2014-06-26 DIAGNOSIS — T82857A Stenosis of cardiac prosthetic devices, implants and grafts, initial encounter: Secondary | ICD-10-CM | POA: Diagnosis present

## 2014-06-26 DIAGNOSIS — Y92009 Unspecified place in unspecified non-institutional (private) residence as the place of occurrence of the external cause: Secondary | ICD-10-CM | POA: Diagnosis not present

## 2014-06-26 DIAGNOSIS — N141 Nephropathy induced by other drugs, medicaments and biological substances: Secondary | ICD-10-CM | POA: Diagnosis not present

## 2014-06-26 DIAGNOSIS — I501 Left ventricular failure: Secondary | ICD-10-CM | POA: Insufficient documentation

## 2014-06-26 DIAGNOSIS — I25810 Atherosclerosis of coronary artery bypass graft(s) without angina pectoris: Secondary | ICD-10-CM | POA: Diagnosis present

## 2014-06-26 DIAGNOSIS — Z66 Do not resuscitate: Secondary | ICD-10-CM | POA: Diagnosis present

## 2014-06-26 DIAGNOSIS — R570 Cardiogenic shock: Secondary | ICD-10-CM | POA: Diagnosis present

## 2014-06-26 DIAGNOSIS — Z4659 Encounter for fitting and adjustment of other gastrointestinal appliance and device: Secondary | ICD-10-CM

## 2014-06-26 HISTORY — PX: LEFT HEART CATHETERIZATION WITH CORONARY/GRAFT ANGIOGRAM: SHX5450

## 2014-06-26 LAB — BASIC METABOLIC PANEL
ANION GAP: 15 (ref 5–15)
Anion gap: 11 (ref 5–15)
Anion gap: 15 (ref 5–15)
BUN: 22 mg/dL (ref 6–23)
BUN: 24 mg/dL — AB (ref 6–23)
BUN: 24 mg/dL — ABNORMAL HIGH (ref 6–23)
CALCIUM: 7.9 mg/dL — AB (ref 8.4–10.5)
CALCIUM: 7.1 mg/dL — AB (ref 8.4–10.5)
CALCIUM: 7.3 mg/dL — AB (ref 8.4–10.5)
CHLORIDE: 106 meq/L (ref 96–112)
CO2: 17 mmol/L — AB (ref 19–32)
CO2: 22 mmol/L (ref 19–32)
CO2: 21 mmol/L (ref 19–32)
CREATININE: 2.29 mg/dL — AB (ref 0.50–1.35)
Chloride: 112 mEq/L (ref 96–112)
Chloride: 104 mEq/L (ref 96–112)
Creatinine, Ser: 1.85 mg/dL — ABNORMAL HIGH (ref 0.50–1.35)
Creatinine, Ser: 2.2 mg/dL — ABNORMAL HIGH (ref 0.50–1.35)
GFR calc Af Amer: 28 mL/min — ABNORMAL LOW (ref >90)
GFR calc Af Amer: 27 mL/min — ABNORMAL LOW (ref >90)
GFR calc non Af Amer: 23 mL/min — ABNORMAL LOW (ref >90)
GFR, EST AFRICAN AMERICAN: 35 mL/min — AB (ref >90)
GFR, EST NON AFRICAN AMERICAN: 30 mL/min — AB (ref >90)
GFR, EST NON AFRICAN AMERICAN: 24 mL/min — AB (ref >90)
GLUCOSE: 305 mg/dL — AB (ref 70–99)
Glucose, Bld: 113 mg/dL — ABNORMAL HIGH (ref 70–99)
Glucose, Bld: 320 mg/dL — ABNORMAL HIGH (ref 70–99)
POTASSIUM: 3.5 mmol/L (ref 3.5–5.1)
POTASSIUM: 3.3 mmol/L — AB (ref 3.5–5.1)
Potassium: 3 mmol/L — ABNORMAL LOW (ref 3.5–5.1)
Sodium: 140 mmol/L (ref 135–145)
Sodium: 141 mmol/L (ref 135–145)
Sodium: 142 mmol/L (ref 135–145)

## 2014-06-26 LAB — GLUCOSE, CAPILLARY
GLUCOSE-CAPILLARY: 270 mg/dL — AB (ref 70–99)
Glucose-Capillary: 195 mg/dL — ABNORMAL HIGH (ref 70–99)
Glucose-Capillary: 138 mg/dL — ABNORMAL HIGH (ref 70–99)
Glucose-Capillary: 103 mg/dL — ABNORMAL HIGH (ref 70–99)
Glucose-Capillary: 97 mg/dL (ref 70–99)

## 2014-06-26 LAB — LIPID PANEL
Cholesterol: 95 mg/dL (ref 0–200)
HDL: 33 mg/dL — AB (ref >39)
LDL Cholesterol: 51 mg/dL (ref 0–99)
Total CHOL/HDL Ratio: 2.9 RATIO
Triglycerides: 55 mg/dL (ref <150)
VLDL: 11 mg/dL (ref 0–40)

## 2014-06-26 LAB — CBC WITH DIFFERENTIAL/PLATELET
BASOS ABS: 0 10*3/uL (ref 0.0–0.1)
BASOS PCT: 0 % (ref 0–1)
Eosinophils Absolute: 0.5 10*3/uL (ref 0.0–0.7)
Eosinophils Relative: 8 % — ABNORMAL HIGH (ref 0–5)
HCT: 29.8 % — ABNORMAL LOW (ref 39.0–52.0)
Hemoglobin: 9.2 g/dL — ABNORMAL LOW (ref 13.0–17.0)
Lymphocytes Relative: 29 % (ref 12–46)
Lymphs Abs: 1.7 10*3/uL (ref 0.7–4.0)
MCH: 29.2 pg (ref 26.0–34.0)
MCHC: 30.9 g/dL (ref 30.0–36.0)
MCV: 94.6 fL (ref 78.0–100.0)
Monocytes Absolute: 0.4 10*3/uL (ref 0.1–1.0)
Monocytes Relative: 6 % (ref 3–12)
Neutro Abs: 3.4 10*3/uL (ref 1.7–7.7)
Neutrophils Relative %: 57 % (ref 43–77)
Platelets: 105 10*3/uL — ABNORMAL LOW (ref 150–400)
RBC: 3.15 MIL/uL — AB (ref 4.22–5.81)
RDW: 14.5 % (ref 11.5–15.5)
WBC: 6 10*3/uL (ref 4.0–10.5)

## 2014-06-26 LAB — I-STAT CHEM 8, ED
BUN: 24 mg/dL — ABNORMAL HIGH (ref 6–23)
Calcium, Ion: 1 mmol/L — ABNORMAL LOW (ref 1.13–1.30)
Chloride: 109 mEq/L (ref 96–112)
Creatinine, Ser: 1.8 mg/dL — ABNORMAL HIGH (ref 0.50–1.35)
Glucose, Bld: 114 mg/dL — ABNORMAL HIGH (ref 70–99)
HEMATOCRIT: 30 % — AB (ref 39.0–52.0)
Hemoglobin: 10.2 g/dL — ABNORMAL LOW (ref 13.0–17.0)
Potassium: 3.4 mmol/L — ABNORMAL LOW (ref 3.5–5.1)
Sodium: 141 mmol/L (ref 135–145)
TCO2: 17 mmol/L (ref 0–100)

## 2014-06-26 LAB — HEPATIC FUNCTION PANEL
ALT: 12 U/L (ref 0–53)
ALT: 187 U/L — ABNORMAL HIGH (ref 0–53)
AST: 22 U/L (ref 0–37)
AST: 497 U/L — ABNORMAL HIGH (ref 0–37)
Albumin: 3 g/dL — ABNORMAL LOW (ref 3.5–5.2)
Albumin: 2.8 g/dL — ABNORMAL LOW (ref 3.5–5.2)
Alkaline Phosphatase: 59 U/L (ref 39–117)
Alkaline Phosphatase: 79 U/L (ref 39–117)
BILIRUBIN DIRECT: 0.2 mg/dL (ref 0.0–0.3)
BILIRUBIN DIRECT: 0.4 mg/dL — AB (ref 0.0–0.3)
BILIRUBIN TOTAL: 0.4 mg/dL (ref 0.3–1.2)
Indirect Bilirubin: 0.2 mg/dL — ABNORMAL LOW (ref 0.3–0.9)
Indirect Bilirubin: 0.4 mg/dL (ref 0.3–0.9)
TOTAL PROTEIN: 5.6 g/dL — AB (ref 6.0–8.3)
Total Bilirubin: 0.8 mg/dL (ref 0.3–1.2)
Total Protein: 5.4 g/dL — ABNORMAL LOW (ref 6.0–8.3)

## 2014-06-26 LAB — I-STAT TROPONIN, ED: Troponin i, poc: 0.07 ng/mL (ref 0.00–0.08)

## 2014-06-26 LAB — POCT I-STAT 3, ART BLOOD GAS (G3+)
Acid-base deficit: 14 mmol/L — ABNORMAL HIGH (ref 0.0–2.0)
Acid-base deficit: 14 mmol/L — ABNORMAL HIGH (ref 0.0–2.0)
BICARBONATE: 13.7 meq/L — AB (ref 20.0–24.0)
BICARBONATE: 14.1 meq/L — AB (ref 20.0–24.0)
O2 SAT: 86 %
O2 Saturation: 91 %
PCO2 ART: 36.5 mmHg (ref 35.0–45.0)
PCO2 ART: 39.2 mmHg (ref 35.0–45.0)
PH ART: 7.181 — AB (ref 7.350–7.450)
PO2 ART: 65 mmHg — AB (ref 80.0–100.0)
TCO2: 15 mmol/L (ref 0–100)
TCO2: 15 mmol/L (ref 0–100)
pH, Arterial: 7.164 — CL (ref 7.350–7.450)
pO2, Arterial: 75 mmHg — ABNORMAL LOW (ref 80.0–100.0)

## 2014-06-26 LAB — PROTIME-INR
INR: 1.18 (ref 0.00–1.49)
INR: 2.49 — ABNORMAL HIGH (ref 0.00–1.49)
Prothrombin Time: 15.1 seconds (ref 11.6–15.2)
Prothrombin Time: 27.1 seconds — ABNORMAL HIGH (ref 11.6–15.2)

## 2014-06-26 LAB — CBC
HEMATOCRIT: 31.8 % — AB (ref 39.0–52.0)
Hemoglobin: 9.9 g/dL — ABNORMAL LOW (ref 13.0–17.0)
MCH: 28.9 pg (ref 26.0–34.0)
MCHC: 31.1 g/dL (ref 30.0–36.0)
MCV: 93 fL (ref 78.0–100.0)
PLATELETS: 166 10*3/uL (ref 150–400)
RBC: 3.42 MIL/uL — AB (ref 4.22–5.81)
RDW: 14.6 % (ref 11.5–15.5)
WBC: 21.6 10*3/uL — AB (ref 4.0–10.5)

## 2014-06-26 LAB — MAGNESIUM: Magnesium: 1.9 mg/dL (ref 1.5–2.5)

## 2014-06-26 LAB — PROCALCITONIN: Procalcitonin: 1.42 ng/mL

## 2014-06-26 LAB — LACTIC ACID, PLASMA: Lactic Acid, Venous: 7.1 mmol/L — ABNORMAL HIGH (ref 0.5–2.2)

## 2014-06-26 LAB — I-STAT CG4 LACTIC ACID, ED: Lactic Acid, Venous: 2.41 mmol/L — ABNORMAL HIGH (ref 0.5–2.2)

## 2014-06-26 LAB — TROPONIN I: Troponin I: 0.06 ng/mL — ABNORMAL HIGH (ref <0.031)

## 2014-06-26 SURGERY — LEFT HEART CATHETERIZATION WITH CORONARY/GRAFT ANGIOGRAM

## 2014-06-26 MED ORDER — MIDAZOLAM HCL 2 MG/2ML IJ SOLN
2.0000 mg | Freq: Once | INTRAMUSCULAR | Status: AC
  Administered 2014-06-26: 2 mg via INTRAVENOUS

## 2014-06-26 MED ORDER — OXYCODONE-ACETAMINOPHEN 5-325 MG PO TABS: 1.0000 | ORAL_TABLET | ORAL | Status: DC | PRN

## 2014-06-26 MED ORDER — LIDOCAINE HCL (PF) 1 % IJ SOLN
INTRAMUSCULAR | Status: AC
  Filled 2014-06-26: qty 30

## 2014-06-26 MED ORDER — SODIUM CHLORIDE 0.9 % IV SOLN
INTRAVENOUS | Status: DC | PRN
  Administered 2014-06-27: 22:00:00 via INTRA_ARTERIAL

## 2014-06-26 MED ORDER — MIDAZOLAM HCL 2 MG/2ML IJ SOLN
INTRAMUSCULAR | Status: AC
  Filled 2014-06-26: qty 4

## 2014-06-26 MED ORDER — POTASSIUM CHLORIDE 20 MEQ/15ML (10%) PO SOLN
40.0000 meq | Freq: Once | ORAL | Status: AC
  Administered 2014-06-26: 40 meq
  Filled 2014-06-26 (×2): qty 30

## 2014-06-26 MED ORDER — ACETAMINOPHEN 160 MG/5ML PO SOLN
650.0000 mg | ORAL | Status: DC | PRN
  Administered 2014-06-26 – 2014-06-27 (×2): 650 mg via ORAL
  Filled 2014-06-26 (×2): qty 20.3

## 2014-06-26 MED ORDER — MIDAZOLAM HCL 2 MG/2ML IJ SOLN: 4.0000 mg | Freq: Once | INTRAMUSCULAR | Status: DC

## 2014-06-26 MED ORDER — DOPAMINE-DEXTROSE 1.6-5 MG/ML-% IV SOLN: 2.0000 ug/kg/min | Freq: Once | INTRAVENOUS | Status: DC

## 2014-06-26 MED ORDER — ASPIRIN EC 81 MG PO TBEC: 81.0000 mg | DELAYED_RELEASE_TABLET | Freq: Every day | ORAL | Status: DC

## 2014-06-26 MED ORDER — HEPARIN (PORCINE) IN NACL 2-0.9 UNIT/ML-% IJ SOLN
INTRAMUSCULAR | Status: AC
  Filled 2014-06-26: qty 500

## 2014-06-26 MED ORDER — SODIUM BICARBONATE 8.4 % IV SOLN
100.0000 meq | Freq: Once | INTRAVENOUS | Status: AC
  Administered 2014-06-26: 100 meq via INTRAVENOUS
  Filled 2014-06-26: qty 100

## 2014-06-26 MED ORDER — DOCUSATE SODIUM 50 MG/5ML PO LIQD
100.0000 mg | Freq: Two times a day (BID) | ORAL | Status: DC | PRN
  Filled 2014-06-26: qty 10

## 2014-06-26 MED ORDER — PROMETHAZINE HCL 25 MG/ML IJ SOLN
12.5000 mg | Freq: Once | INTRAMUSCULAR | Status: DC
  Filled 2014-06-26: qty 1

## 2014-06-26 MED ORDER — ONDANSETRON HCL 4 MG/2ML IJ SOLN: 4.0000 mg | Freq: Four times a day (QID) | INTRAMUSCULAR | Status: DC | PRN

## 2014-06-26 MED ORDER — MAGNESIUM SULFATE 2 GM/50ML IV SOLN
2.0000 g | Freq: Once | INTRAVENOUS | Status: AC
  Administered 2014-06-26: 2 g via INTRAVENOUS
  Filled 2014-06-26: qty 50

## 2014-06-26 MED ORDER — ETOMIDATE 2 MG/ML IV SOLN
20.0000 mg | Freq: Once | INTRAVENOUS | Status: AC
  Administered 2014-06-26: 20 mg via INTRAVENOUS

## 2014-06-26 MED ORDER — NOREPINEPHRINE BITARTRATE 1 MG/ML IV SOLN
0.0000 ug/min | INTRAVENOUS | Status: DC
  Filled 2014-06-26: qty 4

## 2014-06-26 MED ORDER — SODIUM CHLORIDE 0.9 % IJ SOLN
3.0000 mL | Freq: Two times a day (BID) | INTRAMUSCULAR | Status: DC
  Administered 2014-06-26 – 2014-06-30 (×8): 3 mL via INTRAVENOUS

## 2014-06-26 MED ORDER — SODIUM BICARBONATE 8.4 % IV SOLN
INTRAVENOUS | Status: AC
  Filled 2014-06-26: qty 100

## 2014-06-26 MED ORDER — STERILE WATER FOR INJECTION IV SOLN
INTRAVENOUS | Status: DC
  Filled 2014-06-26 (×3): qty 850

## 2014-06-26 MED ORDER — NOREPINEPHRINE BITARTRATE 1 MG/ML IV SOLN
0.0000 ug/min | Freq: Once | INTRAVENOUS | Status: AC
  Administered 2014-06-26: 2 ug/min via INTRAVENOUS
  Filled 2014-06-26: qty 4

## 2014-06-26 MED ORDER — FENTANYL CITRATE 0.05 MG/ML IJ SOLN: 100.0000 ug | Freq: Once | INTRAMUSCULAR | Status: DC

## 2014-06-26 MED ORDER — MORPHINE SULFATE 2 MG/ML IJ SOLN
2.0000 mg | Freq: Once | INTRAMUSCULAR | Status: AC
  Administered 2014-06-26: 2 mg via INTRAVENOUS
  Filled 2014-06-26: qty 1

## 2014-06-26 MED ORDER — CLOPIDOGREL BISULFATE 300 MG PO TABS
300.0000 mg | ORAL_TABLET | Freq: Once | ORAL | Status: AC
  Administered 2014-06-26: 300 mg via ORAL
  Filled 2014-06-26: qty 1

## 2014-06-26 MED ORDER — ONDANSETRON HCL 4 MG/2ML IJ SOLN
INTRAMUSCULAR | Status: AC
  Filled 2014-06-26: qty 2

## 2014-06-26 MED ORDER — SODIUM CHLORIDE 0.9 % IV SOLN
250.0000 mL | INTRAVENOUS | Status: DC | PRN
  Administered 2014-07-01: 250 mL via INTRAVENOUS

## 2014-06-26 MED ORDER — FENTANYL CITRATE 0.05 MG/ML IJ SOLN
INTRAMUSCULAR | Status: AC
  Filled 2014-06-26: qty 2

## 2014-06-26 MED ORDER — CLOPIDOGREL BISULFATE 75 MG PO TABS
75.0000 mg | ORAL_TABLET | Freq: Every day | ORAL | Status: DC
  Administered 2014-06-27 – 2014-06-29 (×3): 75 mg via ORAL
  Filled 2014-06-26 (×8): qty 1

## 2014-06-26 MED ORDER — SODIUM BICARBONATE 8.4 % IV SOLN
INTRAVENOUS | Status: AC
  Filled 2014-06-26: qty 50

## 2014-06-26 MED ORDER — ACETAMINOPHEN 325 MG PO TABS: 650.0000 mg | ORAL_TABLET | ORAL | Status: DC | PRN

## 2014-06-26 MED ORDER — LIDOCAINE HCL (CARDIAC) 20 MG/ML IV SOLN
INTRAVENOUS | Status: AC
  Filled 2014-06-26: qty 5

## 2014-06-26 MED ORDER — CHLORHEXIDINE GLUCONATE 0.12 % MT SOLN
15.0000 mL | Freq: Two times a day (BID) | OROMUCOSAL | Status: DC
  Administered 2014-06-26 – 2014-06-28 (×5): 15 mL via OROMUCOSAL
  Filled 2014-06-26 (×5): qty 15

## 2014-06-26 MED ORDER — SUCCINYLCHOLINE CHLORIDE 20 MG/ML IJ SOLN
INTRAMUSCULAR | Status: AC
  Filled 2014-06-26: qty 1

## 2014-06-26 MED ORDER — FUROSEMIDE 10 MG/ML IJ SOLN
INTRAMUSCULAR | Status: AC
Start: 2014-06-26 — End: 2014-06-26
  Filled 2014-06-26: qty 4

## 2014-06-26 MED ORDER — MORPHINE SULFATE 2 MG/ML IJ SOLN: 2.0000 mg | INTRAMUSCULAR | Status: DC | PRN

## 2014-06-26 MED ORDER — NOREPINEPHRINE BITARTRATE 1 MG/ML IV SOLN
0.0000 ug/min | INTRAVENOUS | Status: DC
  Administered 2014-06-26: 35 ug/min via INTRAVENOUS
  Administered 2014-06-26: 40 ug/min via INTRAVENOUS
  Administered 2014-06-26: 36 ug/min via INTRAVENOUS
  Administered 2014-06-27: 11 ug/min via INTRAVENOUS
  Filled 2014-06-26 (×4): qty 16

## 2014-06-26 MED ORDER — FENTANYL CITRATE 0.05 MG/ML IJ SOLN
100.0000 ug | INTRAMUSCULAR | Status: DC | PRN
  Administered 2014-06-26 – 2014-06-27 (×2): 100 ug via INTRAVENOUS
  Filled 2014-06-26 (×2): qty 2

## 2014-06-26 MED ORDER — FENTANYL CITRATE 0.05 MG/ML IJ SOLN
50.0000 ug | INTRAMUSCULAR | Status: DC | PRN
Start: 2014-06-26 — End: 2014-06-28
  Administered 2014-06-26: 50 ug via INTRAVENOUS
  Administered 2014-06-27: 100 ug via INTRAVENOUS
  Filled 2014-06-26 (×2): qty 2

## 2014-06-26 MED ORDER — ASPIRIN 300 MG RE SUPP
300.0000 mg | RECTAL | Status: AC
  Filled 2014-06-26: qty 1

## 2014-06-26 MED ORDER — ONDANSETRON HCL 4 MG/2ML IJ SOLN
4.0000 mg | Freq: Once | INTRAMUSCULAR | Status: AC
  Administered 2014-06-26: 4 mg via INTRAVENOUS

## 2014-06-26 MED ORDER — SODIUM CHLORIDE 0.9 % IV SOLN
1.0000 mL/kg/h | INTRAVENOUS | Status: AC
Start: 2014-06-26 — End: 2014-06-26
  Administered 2014-06-26 (×2): 1 mL/kg/h via INTRAVENOUS

## 2014-06-26 MED ORDER — ATORVASTATIN CALCIUM 80 MG PO TABS
80.0000 mg | ORAL_TABLET | Freq: Every day | ORAL | Status: DC
  Administered 2014-06-26 – 2014-06-28 (×3): 80 mg via ORAL
  Filled 2014-06-26 (×4): qty 1

## 2014-06-26 MED ORDER — DOPAMINE-DEXTROSE 3.2-5 MG/ML-% IV SOLN
0.0000 ug/kg/min | Freq: Once | INTRAVENOUS | Status: AC
  Administered 2014-06-26: 5 ug/kg/min via INTRAVENOUS

## 2014-06-26 MED ORDER — NOREPINEPHRINE BITARTRATE 1 MG/ML IV SOLN
INTRAVENOUS | Status: AC
  Filled 2014-06-26: qty 4

## 2014-06-26 MED ORDER — DOPAMINE-DEXTROSE 3.2-5 MG/ML-% IV SOLN
INTRAVENOUS | Status: AC
  Filled 2014-06-26: qty 250

## 2014-06-26 MED ORDER — SODIUM CHLORIDE 0.9 % IV SOLN
1.5000 g | Freq: Two times a day (BID) | INTRAVENOUS | Status: DC
  Administered 2014-06-26 – 2014-07-01 (×10): 1.5 g via INTRAVENOUS
  Filled 2014-06-26 (×11): qty 1.5

## 2014-06-26 MED ORDER — FENTANYL CITRATE 0.05 MG/ML IJ SOLN
100.0000 ug | Freq: Once | INTRAMUSCULAR | Status: AC
  Administered 2014-06-26: 100 ug via INTRAVENOUS

## 2014-06-26 MED ORDER — ASPIRIN 81 MG PO CHEW
324.0000 mg | CHEWABLE_TABLET | ORAL | Status: AC
  Administered 2014-06-26: 324 mg via ORAL
  Filled 2014-06-26: qty 4

## 2014-06-26 MED ORDER — INSULIN ASPART 100 UNIT/ML ~~LOC~~ SOLN
2.0000 [IU] | SUBCUTANEOUS | Status: DC
  Administered 2014-06-26: 6 [IU] via SUBCUTANEOUS
  Administered 2014-06-26: 4 [IU] via SUBCUTANEOUS
  Administered 2014-06-26 – 2014-06-27 (×4): 2 [IU] via SUBCUTANEOUS

## 2014-06-26 MED ORDER — NITROGLYCERIN 0.4 MG SL SUBL: 0.4000 mg | SUBLINGUAL_TABLET | SUBLINGUAL | Status: DC | PRN

## 2014-06-26 MED ORDER — HEPARIN SODIUM (PORCINE) 5000 UNIT/ML IJ SOLN
60.0000 [IU]/kg | Freq: Once | INTRAMUSCULAR | Status: AC
  Administered 2014-06-26: 5000 [IU] via INTRAVENOUS

## 2014-06-26 MED ORDER — BIVALIRUDIN 250 MG IV SOLR
INTRAVENOUS | Status: AC
  Filled 2014-06-26: qty 250

## 2014-06-26 MED ORDER — DOPAMINE-DEXTROSE 3.2-5 MG/ML-% IV SOLN: 0.0000 ug/kg/min | INTRAVENOUS | Status: DC

## 2014-06-26 MED ORDER — ROCURONIUM BROMIDE 50 MG/5ML IV SOLN
INTRAVENOUS | Status: AC
  Administered 2014-06-26: 12:00:00
  Filled 2014-06-26: qty 2

## 2014-06-26 MED ORDER — CETYLPYRIDINIUM CHLORIDE 0.05 % MT LIQD
7.0000 mL | Freq: Four times a day (QID) | OROMUCOSAL | Status: DC
  Administered 2014-06-26 – 2014-06-28 (×7): 7 mL via OROMUCOSAL

## 2014-06-26 MED ORDER — ASPIRIN 81 MG PO CHEW
324.0000 mg | CHEWABLE_TABLET | Freq: Once | ORAL | Status: AC
  Administered 2014-06-26: 324 mg via ORAL
  Filled 2014-06-26: qty 4

## 2014-06-26 MED ORDER — HEPARIN SODIUM (PORCINE) 5000 UNIT/ML IJ SOLN
5000.0000 [IU] | Freq: Three times a day (TID) | INTRAMUSCULAR | Status: DC
  Administered 2014-06-26 – 2014-07-03 (×21): 5000 [IU] via SUBCUTANEOUS
  Filled 2014-06-26 (×28): qty 1

## 2014-06-26 MED ORDER — SODIUM BICARBONATE 8.4 % IV SOLN
INTRAVENOUS | Status: DC
  Administered 2014-06-26 (×2): via INTRAVENOUS
  Filled 2014-06-26 (×5): qty 150

## 2014-06-26 MED ORDER — SODIUM CHLORIDE 0.9 % IJ SOLN: 3.0000 mL | INTRAMUSCULAR | Status: DC | PRN

## 2014-06-26 MED ORDER — ASPIRIN 81 MG PO CHEW
81.0000 mg | CHEWABLE_TABLET | Freq: Every day | ORAL | Status: DC
  Administered 2014-06-27 – 2014-06-29 (×3): 81 mg via ORAL
  Filled 2014-06-26 (×3): qty 1

## 2014-06-26 MED ORDER — ETOMIDATE 2 MG/ML IV SOLN
INTRAVENOUS | Status: AC
  Filled 2014-06-26: qty 20

## 2014-06-26 MED ORDER — PANTOPRAZOLE SODIUM 40 MG IV SOLR
40.0000 mg | INTRAVENOUS | Status: DC
  Administered 2014-06-26 – 2014-06-28 (×3): 40 mg via INTRAVENOUS
  Filled 2014-06-26 (×5): qty 40

## 2014-06-26 NOTE — Progress Notes (Signed)
Orthopedic Tech Progress Note Patient Details:  Robert Ritter 09-06-1919 [id number]  Patient ID: [name], male   DOB: [birth year], 81 y.o.   MRN: [id number] RN stated that she is cancelling the order for the knee immobilizer because the pt does not need it at this time.  Hildred Priest 07/23/2014, 8:46 AM

## 2014-06-26 NOTE — ED Provider Notes (Signed)
CSN: [id number]     Arrival date & time 07/06/2014  0253 History  This chart was scribed for Kalman Drape, MD by Edison Simon, ED Scribe. This patient was seen in room [facility] and the patient's care was started at 2:50 AM.    Chief Complaint  Patient presents with  . Altered Mental Status  . Code STEMI    HPI  HPI Comments:  Robert Ritter is a 81 y.o. male with history of CABG and MI brought in by ambulance for altered mental status. Per EMS, he got out of bed stating he did not feel good and was walking towards his wife when he fell. EMS found him alert and responsive but he became unresponsive and lost pulse en route to hospital. He was shocked once while in v fib and pulse returned. He reports chest discomfort and SOB, stating he feels "smothered." He also reports associated nausea. EMS notes his pupils were unequal and blood pressure was in 80s; patient notes prior eye surgery. He does not know if he is on blood thinners.  Family reports patient had similar episode in November, where he became ill and was walking towards his bedroom when he had a fall.  At that time he was diagnosed with dehydration.  Past Medical History  Diagnosis Date  . CAD (coronary artery disease)     S/P CABG and multiple PCI's  . Hyperlipidemia   . HTN (hypertension)   . AAA (abdominal aortic aneurysm)     5 cm on 2011 assessment  . Gout 2009  . Elevated PSA     Dr Rosana Hoes  . Myocardial infarction   . Renal insufficiency   . Glaucoma   . Central retinal vein occlusion of left eye   . Skin cancer of face   . Ulcer 07/23/2012    tongue  . Anemia     normocytic  . Myocardial infarction    Past Surgical History  Procedure Laterality Date  . Coronary artery bypass graft  1981  . Hernia repair    . Inner ear surgery    . Abdominal aortic endovascular stent graft N/A 10/08/2013    Procedure: ABDOMINAL AORTIC ENDOVASCULAR STENT GRAFT;  Surgeon: Elam Dutch, MD;  Location: [facility] OR;  Service: Vascular;   Laterality: N/A;  . Embolization Right 10/08/2013    Procedure: COIL EMBOLIZATION (NESTERS)- RIGHT INTERNAL ILIAC ANEURYSM;  Surgeon: Elam Dutch, MD;  Location: [facility] OR;  Service: Vascular;  Laterality: Right;   Family History  Problem Relation Age of Onset  . Hypertension Other   . Heart disease Father     before age 46  . Heart attack Father    History  Substance Use Topics  . Smoking status: Former Research scientist (life sciences)  . Smokeless tobacco: Never Used  . Alcohol Use: No    Review of Systems  Unable to perform ROS: Acuity of condition      Allergies  Sulfa antibiotics  Home Medications   Prior to Admission medications   Medication Sig Start Date End Date Taking? Authorizing Provider  aspirin 325 MG tablet Take 162.5 mg by mouth daily.    Historical Provider, MD  bimatoprost (LUMIGAN) 0.03 % ophthalmic drops Place 1 drop into both eyes at bedtime.      Historical Provider, MD  celecoxib (CELEBREX) 200 MG capsule Take 200 mg by mouth daily as needed for mild pain or moderate pain.     Historical Provider, MD  epoetin alfa (EPOGEN,PROCRIT) 35329 UNIT/ML  injection Inject 20,000 Units into the skin every 28 (twenty-eight) days.    Historical Provider, MD  fish oil-omega-3 fatty acids 1000 MG capsule Take 1 g by mouth daily.     Historical Provider, MD  furosemide (LASIX) 40 MG tablet Take 1 tablet (40 mg total) by mouth 2 (two) times daily. 05/07/14   Verlee Monte, MD  HYDROcodone-acetaminophen (NORCO/VICODIN) 5-325 MG per tablet Take 1 tablet by mouth every 6 (six) hours as needed for moderate pain. 05/26/14   Aleksei Plotnikov V, MD  nitroGLYCERIN (NITROSTAT) 0.4 MG SL tablet Place 0.4 mg under the tongue as needed for chest pain. Chest pain    Historical Provider, MD  polyethylene glycol (MIRALAX / GLYCOLAX) packet Take 17 g by mouth daily as needed. 10/14/13   Samantha J Rhyne, PA-C  psyllium (METAMUCIL) 58.6 % packet Take 1 packet by mouth daily.    Historical Provider, MD  simvastatin  (ZOCOR) 20 MG tablet Take 1 tablet (20 mg total) by mouth daily. 03/18/14   Blane Ohara, MD  tamsulosin (FLOMAX) 0.4 MG CAPS capsule Take 1 capsule (0.4 mg total) by mouth daily after supper. 05/07/14   Verlee Monte, MD  telmisartan (MICARDIS) 20 MG tablet Take 1 tablet (20 mg total) by mouth daily. 05/07/14   Mutaz Elmahi, MD   BP 77/60 mmHg  Pulse 40  Resp 23  Ht 5\' 10"  (1.778 m)  Wt 150 lb (68.04 kg)  BMI 21.52 kg/m2  SpO2 77% Physical Exam  Constitutional: He is oriented to person, place, and time. He appears distressed.  HENT:  Head: Normocephalic.  Nose: Nose normal.  Mouth/Throat: Oropharynx is clear and moist.  Facial abrasions noted  Neck: Normal range of motion. Neck supple. No JVD present. No tracheal deviation present. No thyromegaly present.  Cardiovascular: Normal heart sounds and intact distal pulses.  Exam reveals no gallop and no friction rub.   No murmur heard. Irregular rate, tachycardia  Pulmonary/Chest: Effort normal and breath sounds normal. No stridor. No respiratory distress. He has no wheezes. He has no rales. He exhibits no tenderness.  Abdominal: Soft. He exhibits no distension and no mass. There is no tenderness. There is no rebound and no guarding.  Musculoskeletal: He exhibits edema. He exhibits no tenderness.  Lymphadenopathy:    He has no cervical adenopathy.  Neurological: He is oriented to person, place, and time.  Skin: No rash noted. He is diaphoretic. No erythema. There is pallor.  Nursing note and vitals reviewed.   ED Course  Procedures (including critical care time)  DIAGNOSTIC STUDIES: Oxygen Saturation is 82% on nasal canula, low by my interpretation.    COORDINATION OF CARE: 3:17 AM Discussed treatment plan with patient at beside, the patient agrees with the plan and has no further questions at this time.  3:34 AM Patient vomiting at this time.   Labs Review Labs Reviewed  BASIC METABOLIC PANEL - Abnormal; Notable for the  following:    CO2 17 (*)    Glucose, Bld 113 (*)    Creatinine, Ser 1.85 (*)    Calcium 7.9 (*)    GFR calc non Af Amer 30 (*)    GFR calc Af Amer 35 (*)    All other components within normal limits  TROPONIN I - Abnormal; Notable for the following:    Troponin I 0.06 (*)    All other components within normal limits  HEPATIC FUNCTION PANEL - Abnormal; Notable for the following:    Total Protein 5.6 (*)  Albumin 3.0 (*)    Indirect Bilirubin 0.2 (*)    All other components within normal limits  CBC WITH DIFFERENTIAL - Abnormal; Notable for the following:    RBC 3.15 (*)    Hemoglobin 9.2 (*)    HCT 29.8 (*)    Platelets 105 (*)    Eosinophils Relative 8 (*)    All other components within normal limits  I-STAT CHEM 8, ED - Abnormal; Notable for the following:    Potassium 3.4 (*)    BUN 24 (*)    Creatinine, Ser 1.80 (*)    Glucose, Bld 114 (*)    Calcium, Ion 1.00 (*)    Hemoglobin 10.2 (*)    HCT 30.0 (*)    All other components within normal limits  I-STAT CG4 LACTIC ACID, ED - Abnormal; Notable for the following:    Lactic Acid, Venous 2.41 (*)    All other components within normal limits  POCT I-STAT 3, ART BLOOD GAS (G3+) - Abnormal; Notable for the following:    pH, Arterial 7.164 (*)    pO2, Arterial 65.0 (*)    Bicarbonate 14.1 (*)    Acid-base deficit 14.0 (*)    All other components within normal limits  PROTIME-INR  BASIC METABOLIC PANEL  LIPID PANEL  CBC  PROTIME-INR  BLOOD GAS, ARTERIAL  I-STAT TROPOININ, ED    Imaging Review Dg Chest Port 1 View  07/06/2014   CLINICAL DATA:  Unresponsive  EXAM: PORTABLE CHEST - 1 VIEW  COMPARISON:  05/05/2014  FINDINGS: Cardiomegaly which is similar to previous. There is mild aortic tortuosity that is also unchanged. Extensive artifact over the chest. There is mild interstitial coarsening but no overt edema. No pneumonia, effusion, or pneumothorax.  IMPRESSION: 1. No pneumonia or definitive edema. 2. Unchanged  cardiomegaly.   Electronically Signed   By: Jorje Guild M.D.   On: 07/20/2014 04:12     EKG Interpretation   Date/Time:  Friday June 26 2014 03:00:20 EST Ventricular Rate:  106 PR Interval:  47 QRS Duration: 113 QT Interval:  370 QTC Calculation: 491 R Axis:   -25 Text Interpretation:  Sinus tachycardia with irregular rate Anterolateral  infarct, acute (LAD) ** ** ACUTE MI / STEMI ** ** Confirmed by Patrecia Veiga  MD,  Makinzey Banes ([id number]) on 07/23/2014 3:24:03 AM     CRITICAL CARE Performed by: Kalman Drape Total critical care time: 60 min Critical care time was exclusive of separately billable procedures and treating other patients. Critical care was necessary to treat or prevent imminent or life-threatening deterioration. Critical care was time spent personally by me on the following activities: development of treatment plan with patient and/or surrogate as well as nursing, discussions with consultants, evaluation of patient's response to treatment, examination of patient, obtaining history from patient or surrogate, ordering and performing treatments and interventions, ordering and review of laboratory studies, ordering and review of radiographic studies, pulse oximetry and re-evaluation of patient's condition. MDM   Final diagnoses:  ST elevation myocardial infarction (STEMI), unspecified artery    81 yo male with vfib arrest with EMS, shocked 200jx1 with ROSC.  Now awake, alert c/o chest pain.  H/o CAD s/p CABG, stents, followed by Dr Burt Knack.    3:24 AM Pt with dropping BP, dopamine started.  Cardiology re-paged.  3:31 AM Case discussed with Dr Posey Pronto with cardiology who will be down to see patient.   I personally performed the services described in this documentation, which was scribed in my presence.  The recorded information has been reviewed and is accurate.     Kalman Drape, MD 07/09/2014 [phone]

## 2014-06-26 NOTE — Progress Notes (Signed)
RN calling - lots of runs of V Tach    Recent Labs Lab 07/04/2014 0259 07/19/2014 0309 07/04/2014 0818 07/11/2014 0936  NA 140 141 141 142  K 3.5 3.4* 3.0* 3.3*  CL 112 109 104 106  CO2 17*  --  22 21  GLUCOSE 113* 114* 320* 305*  BUN 22 24* 24* 24*  CREATININE 1.85* 1.80* 2.20* 2.29*  CALCIUM 7.9*  --  7.1* 7.3*  MG  --   --   --  1.9    PLAN  - kcl 73meq via tube - mag 2gm IV x 1  Dr. Brand Males, M.D., [facility].C.P Pulmonary and Critical Care Medicine Staff Physician [place] [facility] Pager: [phone], If no answer or between  15:00h - 7:00h: call [phone]  06/27/2014 12:39 PM

## 2014-06-26 NOTE — Progress Notes (Signed)
ANTIBIOTIC CONSULT NOTE - INITIAL  Pharmacy Consult for Unasyn  Indication: rule out pneumonia  Allergies  Allergen Reactions  . Sulfa Antibiotics Rash    Patient Measurements: Height: 5\' 10"  (177.8 cm) Weight: 150 lb (68.04 kg) IBW/kg (Calculated) : 73   Vital Signs: Temp: 100.9 F (38.3 C) (01/01 1800) BP: 129/72 mmHg (01/01 0830) Pulse Rate: 100 (01/01 1800) Intake/Output from previous day: 12/31 0701 - 01/01 0700 In: 292.5 [I.V.:292.5] Out: -  Intake/Output from this shift: Total I/O In: 1721 [I.V.:1671; IV Piggyback:50] Out: 407 [Urine:405; Stool:2]  Labs:  Recent Labs  07/25/2014 0259 07/25/2014 0309 07/23/2014 0818 07/15/2014 0936  WBC 6.0  --   --  21.6*  HGB 9.2* 10.2*  --  9.9*  PLT 105*  --   --  166  CREATININE 1.85* 1.80* 2.20* 2.29*   Estimated Creatinine Clearance: 19.8 mL/min (by C-G formula based on Cr of 2.29). No results for input(s): VANCOTROUGH, VANCOPEAK, VANCORANDOM, GENTTROUGH, GENTPEAK, GENTRANDOM, TOBRATROUGH, TOBRAPEAK, TOBRARND, AMIKACINPEAK, AMIKACINTROU, AMIKACIN in the last 72 hours.   Microbiology: No results found for this or any previous visit (from the past 720 hour(s)).  Medical History: Past Medical History  Diagnosis Date  . CAD (coronary artery disease)     S/P CABG and multiple PCI's  . Hyperlipidemia   . HTN (hypertension)   . AAA (abdominal aortic aneurysm)     5 cm on 2011 assessment  . Gout 2009  . Elevated PSA     Dr Rosana Hoes  . Myocardial infarction   . Renal insufficiency   . Glaucoma   . Central retinal vein occlusion of left eye   . Skin cancer of face   . Ulcer 07/23/2012    tongue  . Anemia     normocytic  . Myocardial infarction     Assessment: 81yo s/p admitted with STEMI and had brief VF arrest with ROSC.  Plan to being Unasyn for empiric coverage of possible aspiration pneumonia.   Plan:  Unasyn 1.5Gm IV q12   Bonnita Nasuti Pharm.D. CPP, BCPS Clinical Pharmacist [phone] 06/27/2014 6:03  PM

## 2014-06-26 NOTE — CV Procedure (Addendum)
Cardiac Catheterization Procedure Note  Name: Robert Ritter MRN: [id number] DOB: Aug 28, 1919  Procedure: Left Heart Cath, Selective Coronary Angiography, LV angiography,  Aortic root angiogram, PTCA/Stent of the SVG-LAD  Indication: Anterior wall MI. Robert Ritter is a 81 year old gentleman, well-known to me from outpatient care, with remote CABG approximately 30 years ago. He underwent PCI in 2006 with stenting of his saphenous pain grafts. The patient has had chest pain for 4 or 5 days on and off. Tonight he developed severe substernal chest pain and EMS was called. An acute anterolateral MI was diagnosed. The patient had a ventricular fibrillation arrest on the way to the hospital but he was quickly resuscitated. On my evaluation in the emergency department, the patient was awake but acutely ill in moderate to severe distress because of chest pain. He was on high doses of dopamine and norepinephrine because of ongoing hypotension. Dr. Allena Katz and I had a lengthy discussion with the patient's family about how to best proceed in this elderly but functional and independent gentleman. They understood that his risk of mortality in the setting of a large anterolateral MI and cardiogenic shock is quite high and clearly exceeds 50%. The patient and family all agree that they would like aggressive care. The patient was brought to the cardiac catheterization lab emergently for catheterization and possible PCI.  Diagnostic Procedure Details: The right groin was prepped, draped, and anesthetized with 1% lidocaine. Using the modified Seldinger technique, a 6 French sheath was introduced into the right femoral artery. The sheath was placed without difficulty, but it was somewhat difficult to advance a wire through the patient's aortoiliac graft. A JR4 catheter was used to direct the wire into the abdominal aorta. The JR4 catheter was then used to image the saphenous vein graft sequential to LAD and diagonal.  This graft was totally occluded with contrast stain suggestive of acute occlusion. Using an exchange length wire, the diagnostic catheter was changed out for a 6 [place] JR4 guide catheter. The patient has been maintained on long-term Plavix. He received heparin 4000 units in the emergency department. Angiomax was administered intravenously at reduced rate because of chronic kidney disease. A cougar guidewire was used to cross the occlusion and this was without difficulty. A 2.5 mm balloon was advanced and inflated throughout the stented segment in the distal body of the saphenous pain grafts. This restored TIMI 3 flow. During the entire procedure, the patient continued to have hypotension, chest pain, smothering feeling of shortness of breath, and vomiting. Vasopressors were adjusted accordingly. IV lidocaine was administered to stabilize his rhythm as he had hemodynamic instability associated with an accelerated idioventricular rhythm during the procedure. After reestablishing TIMI-3 flow in the saphenous vein graft, the stented segment was then redilated with a 3.5 mm balloon. It appeared that the culprit for the patient's occlusion was at the proximal adjuvant the previously implanted stent. This area was covered with a 4.0 x 16 mm Promus DES. The stent was deployed at 12 atm. It appeared well sized for the saphenous vein graft. There was 0% residual stenosis and TIMI 3 flow at the completion of the PCI procedure. The saphenous pain grafts sequential to OM and PDA branches was then engaged with a multipurpose catheter. This vessel was patent. The multipurpose catheter was then used to cross the aortic valve and recorded left ventricular pressure. This was changed out for a pigtail catheter and an aortogram was performed. This was done to assess the degree of the patient's  aortic insufficiency. I felt this was important in determining whether he might be a candidate for an intra-aortic balloon pump. I reviewed the  patient's recent echocardiogram as well as his aortogram and felt that his aortic insufficiency was in the range of 2-3+ and I therefore felt he was not a candidate for intra-aortic balloon counterpulsation. The patient was also treated with IV bicarbonate during the procedure in order to treat lactic acidosis with a pH of 7.1. A femoral venous sheath was also placed at the completion of the procedure so that he has adequate IV access for his vasopressor therapy.  He was transported from the Cath Lab to the cardiac ICU in critical condition. I discussed his case with Dr Lamonte Sakai and placed a CCM consult for help with his management as he is at high risk for further respiratory compromise and mechanical ventilation.  PROCEDURAL FINDINGS Hemodynamics: AO 65/42 (increased to  83/49 at the completion of the procedure) LV 72/28  Coronary angiography: Coronary dominance: right  Left mainstem: not injected. Known to be occluded  Left anterior descending (LAD): Known to be occluded  Left circumflex (LCx): Known to be occluded  Right coronary artery (RCA): Severely calcified vessel. Totally occluded in the midportion.  Saphenous pain grafts sequential to LAD and first diagonal: Initially the graft was totally occluded in the mid body just proximal to the previously stented segment. After reperfusion, the graft is seen to be patent with mild stenosis in the segment between the first diagonal branch and LAD. The percent stenosis is estimated at 40%. The native LAD after reperfusion is patent to the apex. There is distal occlusion of the most apical portion of the vessel. The diagonal branches are also shown to be patent.  Saphenous pain grafts sequential to the PDA, PLA, and obtuse marginal branches of the circumflex is patent. The stented segments in the distal body of the graft and at the distal anastomosis to the obtuse marginal branch appear to be patent with mild diffuse in-stent restenosis no greater  than 30-40%. There is TIMI-3 flow throughout this graft.  Left ventriculography: The LV is poorly visualized. I am unable to estimate the LVEF.  Aortic root angiography: There is moderate aortic insufficiency. The aortic sinuses appear dilated.  PCI Data: Vessel - LAD/Segment - distal body of saphenous vein graft Percent Stenosis (pre)  100 TIMI-flow 0 Stent a 4.0 x 16 mm Promus DES Percent Stenosis (post) 0 TIMI-flow (post) 3  Contrast: 130 cc Omnipaque  Radiation dose/Fluoro time: 11.7 minutes  Estimated Blood Loss: minimal  Final Conclusions:   1. Acute anterior MI and cardiogenic shock secondary to acute occlusion of the SVG-LAD/diagonal graft 2. Severe native vessel CAD with known occlusion of the left main, LAD, LCx, and total occlusion of the mid-RCA 3. Successful primary PCI of the SVG-LAD/diagonal  Recommendations: The patient is critically ill. Unfortunately he is not a candidate for hemodynamic support at age 85 with moderate aortic insufficiency and significant vascular disease with previous endovascular aortic aneurysm repair. Lengthy discussion following the procedure with the patient's family. We will continue with supportive measures, inotropic therapy, and short-term mechanical ventilation if his respiratory status worsens. Hopefully with reperfusion of his LAD/diagonal, he will improve over the course of the next 24 hours. They understand he is at very high risk of further complications including acute renal failure, respiratory failure, and death.  The patient is critically ill with multiple organ systems failure and requires high complexity decision making for assessment and support,  frequent evaluation and titration of therapies, application of advanced monitoring technologies and extensive interpretation of multiple databases.   Critical Care Time devoted to patient care services described in this note is greater than 60 minutes.  Sherren Mocha MD,  [facility] 07/20/2014, 5:38 AM

## 2014-06-26 NOTE — ED Notes (Signed)
Dr Otter at bedside  

## 2014-06-26 NOTE — ED Notes (Signed)
Pt placed on nonrebreather.  

## 2014-06-26 NOTE — ED Notes (Signed)
Dr. Cooper at bedside 

## 2014-06-26 NOTE — ED Notes (Signed)
Pt arrives via EMS from home. pts wife states that pt got out of bed claiming that he was not feeling well. States that he fell and the wife called EMS. Upon EMS arrival pt was conscious and alert. During transport pt became unresponsive and lost pulses. Pt was in vfib and was shocked x1, 200j. Pulses returned. Pt was also started on an epi drip and given 2000 ml NS.

## 2014-06-26 NOTE — ED Notes (Signed)
Dr. Patel at bedside 

## 2014-06-26 NOTE — Progress Notes (Signed)
Pt has been vomiting since admission. BIPAP held at this time per Dr. Lamonte Sakai. BIPAP at bedside on stand by.

## 2014-06-26 NOTE — Progress Notes (Signed)
eLink Physician-Brief Progress Note Patient Name: Robert Ritter DOB: 1920-04-24 MRN: [id number]   Date of Service  06/30/2014  HPI/Events of Note  81 yo man, hx CAD/ CABG and vein graft stents. Admitted with STEMI and cardiogenic shock. To cath lab for revascularization, has required dopamine and high dose norepi.  ABG w metabolic acidosis.   eICU Interventions  - would like to avoid intubation if possible as he would not likely survive to extubation - will start bicarb gtt to temporize; hopefully metabolic and hemodynamic status will improve with revascularization.  - will use BiPAP for either hypoxemia or acidosis. WOB is currently surprisingly manageable.   - will have Dr Chase Caller evaluate him this am, assess progress, discuss risks/ benefits of intubation with pt and family     Intervention Category Evaluation Type: New Patient Evaluation  Krystyn Picking S. 07/04/2014, 6:21 AM

## 2014-06-26 NOTE — H&P (Signed)
CARDIOLOGY ADMISSION/CONSULT NOTE  Assessment and Plan:  *Cardiogenic shock 2/2 acute anterior myocardial infarction: Robert Ritter is a pleasant antitrope male with known history of vein graft to LAD stent placement comes to the emergency department with acute onset chest pain secondary to stent thrombosis and now cardiogenic shock. -- Status post left heart catheterization and PCI to the distal vein graft with TIMI 3 distal flow. -- Continue hemodynamic support with dopamine and norepinephrine.  -- Continue aspirin 81 mg daily -- Continue Plavix 75 mg daily -- Continue bivalrudin -- Continue simvastatin 40 mg daily -- We'll get a transthoracic echocardiogram in the morning. -- We'll need close monitoring of the volume status  *Hypertension: Hol any antihypertensives for now  *Dyslipidemia: -- Continue simvastatin  *CODE STATUS: We had an extensive discussion with the family about goals of care. Patient and family wishes to keep full code.  Chief complaint: Chest pain, V. fib arrest  HPI:  Robert Ritter is a 81 year old male with history of CAD status post CABG, multiple PCI, hypertension, dyslipidemia, abdominal aortic aneurysm status post endovascular repair was brought to the emergency department with acute onset chest pains. Patient has been having intermittent episodes of epigastric pain over past 4-5 days but acutely worsened this evening when he contacted EMS. At some point, patient had ventricular fibrillation requiring 1 shock with return of spontaneous circulation. He subsequent EKG showed anterior lateral ST elevation myocardial infarction. In addition, patient was noted to be in cardiogenic shock requiring dopamine and norepinephrine to maintain map greater than 55. After much discussion with the family about risk and benefit of the cardiac catheterization including dialysis patient was brought to the Cath Lab urgently. Of note, patient endorses compliance with dual  antiplatelet therapy.   Cardiac history:   Previous cardiac imaging  EKG: Sinus tachycardia with anterolateral ST elevations with reciprocal depressions in the inferior leads  TTE on 09/15/2013: LVEF 65-70, moderate aortic insufficiency   Past Medical History Past Medical History  Diagnosis Date  . CAD (coronary artery disease)     S/P CABG and multiple PCI's  . Hyperlipidemia   . HTN (hypertension)   . AAA (abdominal aortic aneurysm)     5 cm on 2011 assessment  . Gout 2009  . Elevated PSA     Dr Rosana Hoes  . Myocardial infarction   . Renal insufficiency   . Glaucoma   . Central retinal vein occlusion of left eye   . Skin cancer of face   . Ulcer 07/23/2012    tongue  . Anemia     normocytic  . Myocardial infarction     Allergies: Allergies  Allergen Reactions  . Sulfa Antibiotics Rash    Social History History   Social History  . Marital Status: Married    Spouse Name: N/A    Number of Children: N/A  . Years of Education: N/A   Occupational History  . RETIRED    Social History Main Topics  . Smoking status: Former Research scientist (life sciences)  . Smokeless tobacco: Never Used  . Alcohol Use: No  . Drug Use: No  . Sexual Activity: Yes   Other Topics Concern  . Not on file   Social History Narrative   Married x 74 yrs in 2010    Family History Family History  Problem Relation Age of Onset  . Hypertension Other   . Heart disease Father     before age 49  . Heart attack Father     Physical  Exam Filed Vitals:   07/10/2014 0350  BP: 73/52  Pulse: 84  Resp: 26    Gen: appears to be in significant distress but following commands  HEENT: Neck: distended no obvious carotid bruits  AX:[id number] distant heart sounds normal S1 and loud S2., 2/6 diastolic murmur best the right upper sternal border  Pulm: tachypneic, decrease in movement bilaterally anteriorly  Abdomen: soft nontender nondistended  Ext: no cyanosis clubbing or edema  Neurologically: No focal gross  neurological deficits noted.  Labs:  Results for orders placed or performed during the hospital encounter of 07/05/2014 (from the past 24 hour(s))  CBC with Differential     Status: Abnormal (Preliminary result)   Collection Time: 07/11/2014  2:59 AM  Result Value Ref Range   WBC 6.0 4.0 - 10.5 K/uL   RBC 3.15 (L) 4.22 - 5.81 MIL/uL   Hemoglobin 9.2 (L) 13.0 - 17.0 g/dL   HCT 29.8 (L) 39.0 - 52.0 %   MCV 94.6 78.0 - 100.0 fL   MCH 29.2 26.0 - 34.0 pg   MCHC 30.9 30.0 - 36.0 g/dL   RDW 14.5 11.5 - 15.5 %   Platelets PENDING 150 - 400 K/uL   Neutrophils Relative % 57 43 - 77 %   Neutro Abs 3.4 1.7 - 7.7 K/uL   Lymphocytes Relative 29 12 - 46 %   Lymphs Abs 1.7 0.7 - 4.0 K/uL   Monocytes Relative 6 3 - 12 %   Monocytes Absolute 0.4 0.1 - 1.0 K/uL   Eosinophils Relative 8 (H) 0 - 5 %   Eosinophils Absolute 0.5 0.0 - 0.7 K/uL   Basophils Relative 0 0 - 1 %   Basophils Absolute 0.0 0.0 - 0.1 K/uL  Protime-INR     Status: None   Collection Time: 06/28/2014  2:59 AM  Result Value Ref Range   Prothrombin Time 15.1 11.6 - 15.2 seconds   INR 1.18 0.00 - 1.49  I-stat troponin, ED     Status: None   Collection Time: 07/10/2014  3:07 AM  Result Value Ref Range   Troponin i, poc 0.07 0.00 - 0.08 ng/mL   Comment 3          I-stat Chem 8, ED     Status: Abnormal   Collection Time: 07/02/2014  3:09 AM  Result Value Ref Range   Sodium 141 135 - 145 mmol/L   Potassium 3.4 (L) 3.5 - 5.1 mmol/L   Chloride 109 96 - 112 mEq/L   BUN 24 (H) 6 - 23 mg/dL   Creatinine, Ser 1.80 (H) 0.50 - 1.35 mg/dL   Glucose, Bld 114 (H) 70 - 99 mg/dL   Calcium, Ion 1.00 (L) 1.13 - 1.30 mmol/L   TCO2 17 0 - 100 mmol/L   Hemoglobin 10.2 (L) 13.0 - 17.0 g/dL   HCT 30.0 (L) 39.0 - 52.0 %  I-Stat CG4 Lactic Acid, ED     Status: Abnormal   Collection Time: 06/29/2014  3:09 AM  Result Value Ref Range   Lactic Acid, Venous 2.41 (H) 0.5 - 2.2 mmol/L

## 2014-06-26 NOTE — ED Notes (Signed)
Code Stemi activateed @ 2820

## 2014-06-26 NOTE — Procedures (Signed)
Intubation Procedure Note EMRY BARBATO [id number] 1920/05/10  Procedure: Intubation Indications: Respiratory insufficiency  Procedure Details Consent: Risks of procedure as well as the alternatives and risks of each were explained to the (patient/caregiver).  Consent for procedure obtained. and Unable to obtain consent because of emergent medical necessity. Time Out: Verified patient identification, verified procedure, site/side was marked, verified correct patient position, special equipment/implants available, medications/allergies/relevent history reviewed, required imaging and test results available.  Performed  Maximum sterile technique was used including cap, gloves, gown, hand hygiene and mask.  MAC    Evaluation Hemodynamic Status: Persistent hypotension treated with pressors; O2 sats: transiently fell during during procedure Patient's Current Condition: stable Complications: No apparent complications Patient did tolerate procedure well. Chest X-ray ordered to verify placement.  CXR: pending.   Eleyna Brugh 06/28/2014  glidescope used No aspiration Bicarb given pre and post procedure   Dr. Brand Males, M.D., [facility].C.P Pulmonary and Critical Care Medicine Staff Physician [facility] Pager: [phone], If no answer or between  15:00h - 7:00h: call [phone]  07/20/2014 8:12 AM

## 2014-06-26 NOTE — Progress Notes (Signed)
Patient is having nausea with some emesis. Dr. Lamonte Sakai aware and is ok with leaving Bipap off until CCM rounds and assesses at 7am.

## 2014-06-26 NOTE — Interval H&P Note (Signed)
History and Physical Interval Note:  07/23/2014 5:32 AM  Robert Ritter  has presented today for surgery, with the diagnosis of STEMI  The various methods of treatment have been discussed with the patient and family. After consideration of risks, benefits and other options for treatment, the patient has consented to  Procedure(s): LEFT HEART CATHETERIZATION WITH CORONARY/GRAFT ANGIOGRAM (N/A) as a surgical intervention .  The patient's history has been reviewed, patient examined, no change in status, stable for surgery.  I have reviewed the patient's chart and labs.  Questions were answered to the patient's satisfaction.    Cath Lab Visit (complete for each Cath Lab visit)  Clinical Evaluation Leading to the Procedure:   ACS: Yes.    Non-ACS:    Anginal Classification: CCS IV  Anti-ischemic medical therapy: Minimal Therapy (1 class of medications)  Non-Invasive Test Results: No non-invasive testing performed  Prior CABG: Previous CABG       Sherren Mocha

## 2014-06-26 NOTE — ED Notes (Signed)
Pt vomittedx1

## 2014-06-26 NOTE — H&P (Signed)
PULMONARY / CRITICAL CARE MEDICINE   Name: Robert Ritter MRN: [id number] DOB: 10/10/1919   PCP Walker Kehr, MD   ADMISSION DATE:  07/23/2014 CONSULTATION DATE:  07/04/2014   REFERRING MD :  Dr Gerrit Halls  CHIEF COMPLAINT:  Cardiogenic shock, acute resp failure   HISTORY OF PRESENT ILLNESS:  Functional 81 year old male, developed 4 days chest pain history that worsened and diagnsed with acute anterolateral MI and s/p brief V Fib arrest on way to hospital with ROSC and mental status but cardiogenic shock on leveophed and dopamine. EMergent cath lab: and s/p Promus DES stent LAD/Segment distal body of SVG but returned to CCU in cardiogenic shock, acidosis, and resp failure. PCCM consuled for acute resp failure - needing intubation and medical mgmt. Full code     SIGNIFICANT EVENTS: 07/25/2014 - admit      PAST MEDICAL HISTORY :   has a past medical history of CAD (coronary artery disease); Hyperlipidemia; HTN (hypertension); AAA (abdominal aortic aneurysm); Gout (2009); Elevated PSA; Myocardial infarction; Renal insufficiency; Glaucoma; Central retinal vein occlusion of left eye; Skin cancer of face; Ulcer (07/23/2012); Anemia; and Myocardial infarction.  has past surgical history that includes Coronary artery bypass graft (1981); Hernia repair; Inner ear surgery; Abdominal aortic endovascular stent graft (N/A, 10/08/2013); and embolization (Right, 10/08/2013). Prior to Admission medications   Medication Sig Start Date End Date Taking? Authorizing Provider  aspirin 325 MG tablet Take 162.5 mg by mouth daily.    Historical Provider, MD  bimatoprost (LUMIGAN) 0.03 % ophthalmic drops Place 1 drop into both eyes at bedtime.      Historical Provider, MD  celecoxib (CELEBREX) 200 MG capsule Take 200 mg by mouth daily as needed for mild pain or moderate pain.     Historical Provider, MD  epoetin alfa (EPOGEN,PROCRIT) 54008 UNIT/ML injection Inject 20,000 Units into the skin every 28  (twenty-eight) days.    Historical Provider, MD  fish oil-omega-3 fatty acids 1000 MG capsule Take 1 g by mouth daily.     Historical Provider, MD  furosemide (LASIX) 40 MG tablet Take 1 tablet (40 mg total) by mouth 2 (two) times daily. 05/07/14   Verlee Monte, MD  HYDROcodone-acetaminophen (NORCO/VICODIN) 5-325 MG per tablet Take 1 tablet by mouth every 6 (six) hours as needed for moderate pain. 05/26/14   Aleksei Plotnikov V, MD  nitroGLYCERIN (NITROSTAT) 0.4 MG SL tablet Place 0.4 mg under the tongue as needed for chest pain. Chest pain    Historical Provider, MD  polyethylene glycol (MIRALAX / GLYCOLAX) packet Take 17 g by mouth daily as needed. 10/14/13   Samantha J Rhyne, PA-C  psyllium (METAMUCIL) 58.6 % packet Take 1 packet by mouth daily.    Historical Provider, MD  simvastatin (ZOCOR) 20 MG tablet Take 1 tablet (20 mg total) by mouth daily. 03/18/14   Blane Ohara, MD  tamsulosin (FLOMAX) 0.4 MG CAPS capsule Take 1 capsule (0.4 mg total) by mouth daily after supper. 05/07/14   Verlee Monte, MD  telmisartan (MICARDIS) 20 MG tablet Take 1 tablet (20 mg total) by mouth daily. 05/07/14   Verlee Monte, MD   Allergies  Allergen Reactions  . Sulfa Antibiotics Rash    FAMILY HISTORY:  indicated that his mother is deceased. He indicated that his father is deceased.  SOCIAL HISTORY:  reports that he has quit smoking. He has never used smokeless tobacco. He reports that he does not drink alcohol or use illicit drugs.  REVIEW OF SYSTEMS:  *  unablle to elicit in detail due to opoor sensorium.    VITAL SIGNS: Pulse Rate:  [40-107] 107 (01/01 0645) Resp:  [12-29] 21 (01/01 0645) BP: (62-116)/(33-67) 110/47 mmHg (01/01 0645) SpO2:  [77 %-100 %] 90 % (01/01 0645) Arterial Line BP: (92-104)/(51-58) 92/51 mmHg (01/01 0645) Weight:  [68.04 kg (150 lb)] 68.04 kg (150 lb) (01/01 0307) HEMODYNAMICS:   VENTILATOR SETTINGS:   INTAKE / OUTPUT: No intake or output data in the 24 hours ending  07/14/2014 0755  PHYSICAL EXAMINATION: General:  Elderly male. Younger looking than stated age ` Neuro:  Alert, able to follow some comand but not all, somewhat confused. Moves all 4s but not agitated HEENT:  NEck supple, PEERL + Cardiovascular:  On pressors. HEart sounds + Lungs:  Mild paradoxical respiration and tachypnea, looks tired Abdomen:  Soft, non tendr GU: looks normal. Right femoral sheath + Musculoskeletal:  No cyanosis, no clubbing, no edema Skin:  Intact anteriorly  LABS: PULMONARY  Recent Labs Lab 07/13/2014 0309 07/09/2014 0613  PHART  --  7.164*  PCO2ART  --  39.2  PO2ART  --  65.0*  HCO3  --  14.1*  TCO2 17 15  O2SAT  --  86.0    CBC  Recent Labs Lab 07/16/2014 0259 07/09/2014 0309  HGB 9.2* 10.2*  HCT 29.8* 30.0*  WBC 6.0  --   PLT 105*  --     COAGULATION  Recent Labs Lab 07/24/2014 0259  INR 1.18    CARDIAC   Recent Labs Lab 07/18/2014 0259  TROPONINI 0.06*   No results for input(s): PROBNP in the last 168 hours.   CHEMISTRY  Recent Labs Lab 07/25/2014 0259 07/02/2014 0309  NA 140 141  K 3.5 3.4*  CL 112 109  CO2 17*  --   GLUCOSE 113* 114*  BUN 22 24*  CREATININE 1.85* 1.80*  CALCIUM 7.9*  --    Estimated Creatinine Clearance: 25.2 mL/min (by C-G formula based on Cr of 1.8).   LIVER  Recent Labs Lab 06/29/2014 0259  AST 22  ALT 12  ALKPHOS 59  BILITOT 0.4  PROT 5.6*  ALBUMIN 3.0*  INR 1.18     INFECTIOUS  Recent Labs Lab 07/13/2014 0309  LATICACIDVEN 2.41*     ENDOCRINE CBG (last 3)  No results for input(s): GLUCAP in the last 72 hours.       IMAGING x48h Dg Chest Port 1 View  07/10/2014   CLINICAL DATA:  Unresponsive  EXAM: PORTABLE CHEST - 1 VIEW  COMPARISON:  05/05/2014  FINDINGS: Cardiomegaly which is similar to previous. There is mild aortic tortuosity that is also unchanged. Extensive artifact over the chest. There is mild interstitial coarsening but no overt edema. No pneumonia, effusion, or  pneumothorax.  IMPRESSION: 1. No pneumonia or definitive edema. 2. Unchanged cardiomegaly.   Electronically Signed   By: Jorje Guild M.D.   On: 07/05/2014 04:12       ASSESSMENT / PLAN:  PULMONARY OETT 07/14/2014 >>  A:Acute respiratory failure due to acidosis and cardiogenic shock with possible/likely pulmonary edema P:   INtubate Full Mechanical vent support Monitor with abg and cxr  CARDIOVASCULAR CVLRt groin femoral sheat 07/02/2014 >  A: Anterolateral STEMI ,. S./p PCI Cardiogenic shock Brief V FIb arrest en route to hospital 06/30/2014  P:  Pressors to get SBP  > 95 Rest per cards  RENAL A:  Profound acidosis due to cardiogenic shock.  P:   Recheck abg and lactate Bicarb gtt  GASTROINTESTINAL A:  nause and vomit pre intubation but not sure about aspiration P:   PPI  HEMATOLOGIC A:  At risk for anemia due to bleeding and critical illness P:  PRBC for goal hgb > 8gm% in setting of MI  INFECTIOUS A:  No infection but urine looks cloudy per RN P:   Urine culture Sepsis biomarkers No abx for now  ENDOCRINE A:  Nil acute   P:   ssi  NEUROLOGIC A:  Normal mental status post arrest P:   RASS goal: 0 to -2 PAD protocol #1 with fent prn   CODE     Code Status Orders        Start     Ordered   07/19/2014 0634  Full code   Continuous     07/05/2014 0633       FAMILY  - Updates: done by Dr Burt Knack . Full code for now. D/w Dr Burt Knack multiple times  - Inter-disciplinary family meet or Palliative Care meeting due by: day 7 which is 07/03/13    The patient is critically ill with multiple organ systems failure and requires high complexity decision making for assessment and support, frequent evaluation and titration of therapies, application of advanced monitoring technologies and extensive interpretation of multiple databases.   Critical Care Time devoted to patient care services described in this note is  35  Minutes. This time reflects time of care  of this signee Dr Brand Males. This critical care time does not reflect procedure time, or teaching time or supervisory time of PA/NP/Med student/Med Resident etc but could involve care discussion time    Dr. Brand Males, M.D., [facility].C.P Pulmonary and Critical Care Medicine Staff Physician [place] Pager: [phone], If no answer or between  15:00h - 7:00h: call [phone]  07/18/2014 8:24 AM

## 2014-06-27 ENCOUNTER — Other Ambulatory Visit: Payer: Self-pay

## 2014-06-27 ENCOUNTER — Inpatient Hospital Stay (HOSPITAL_COMMUNITY): Payer: Medicare Other

## 2014-06-27 DIAGNOSIS — I4729 Other ventricular tachycardia: Secondary | ICD-10-CM

## 2014-06-27 DIAGNOSIS — I472 Ventricular tachycardia: Secondary | ICD-10-CM

## 2014-06-27 DIAGNOSIS — I059 Rheumatic mitral valve disease, unspecified: Secondary | ICD-10-CM

## 2014-06-27 LAB — BASIC METABOLIC PANEL
ANION GAP: 11 (ref 5–15)
BUN: 27 mg/dL — ABNORMAL HIGH (ref 6–23)
CALCIUM: 7.3 mg/dL — AB (ref 8.4–10.5)
CHLORIDE: 104 meq/L (ref 96–112)
CO2: 24 mmol/L (ref 19–32)
Creatinine, Ser: 2.67 mg/dL — ABNORMAL HIGH (ref 0.50–1.35)
GFR calc non Af Amer: 19 mL/min — ABNORMAL LOW (ref >90)
GFR, EST AFRICAN AMERICAN: 22 mL/min — AB (ref >90)
GLUCOSE: 136 mg/dL — AB (ref 70–99)
POTASSIUM: 3.9 mmol/L (ref 3.5–5.1)
SODIUM: 139 mmol/L (ref 135–145)

## 2014-06-27 LAB — CBC WITH DIFFERENTIAL/PLATELET
BASOS ABS: 0 10*3/uL (ref 0.0–0.1)
Basophils Relative: 0 % (ref 0–1)
Eosinophils Absolute: 0 10*3/uL (ref 0.0–0.7)
Eosinophils Relative: 0 % (ref 0–5)
HCT: 32.5 % — ABNORMAL LOW (ref 39.0–52.0)
Hemoglobin: 10.5 g/dL — ABNORMAL LOW (ref 13.0–17.0)
LYMPHS PCT: 8 % — AB (ref 12–46)
Lymphs Abs: 1.6 10*3/uL (ref 0.7–4.0)
MCH: 29.3 pg (ref 26.0–34.0)
MCHC: 32.3 g/dL (ref 30.0–36.0)
MCV: 90.8 fL (ref 78.0–100.0)
MONO ABS: 1.5 10*3/uL — AB (ref 0.1–1.0)
MONOS PCT: 7 % (ref 3–12)
Neutro Abs: 17.4 10*3/uL — ABNORMAL HIGH (ref 1.7–7.7)
Neutrophils Relative %: 85 % — ABNORMAL HIGH (ref 43–77)
Platelets: 237 10*3/uL (ref 150–400)
RBC: 3.58 MIL/uL — ABNORMAL LOW (ref 4.22–5.81)
RDW: 14.6 % (ref 11.5–15.5)
WBC: 20.5 10*3/uL — ABNORMAL HIGH (ref 4.0–10.5)

## 2014-06-27 LAB — MAGNESIUM: Magnesium: 2.2 mg/dL (ref 1.5–2.5)

## 2014-06-27 LAB — POCT I-STAT 3, ART BLOOD GAS (G3+)
Acid-Base Excess: 4 mmol/L — ABNORMAL HIGH (ref 0.0–2.0)
Acid-Base Excess: 1 mmol/L (ref 0.0–2.0)
BICARBONATE: 24.2 meq/L — AB (ref 20.0–24.0)
Bicarbonate: 25.2 mEq/L — ABNORMAL HIGH (ref 20.0–24.0)
O2 Saturation: 99 %
O2 Saturation: 94 %
Patient temperature: 38.1
Patient temperature: 38.3
TCO2: 26 mmol/L (ref 0–100)
TCO2: 25 mmol/L (ref 0–100)
pCO2 arterial: 27.6 mmHg — ABNORMAL LOW (ref 35.0–45.0)
pCO2 arterial: 33.7 mmHg — ABNORMAL LOW (ref 35.0–45.0)
pH, Arterial: 7.573 — ABNORMAL HIGH (ref 7.350–7.450)
pH, Arterial: 7.47 — ABNORMAL HIGH (ref 7.350–7.450)
pO2, Arterial: 140 mmHg — ABNORMAL HIGH (ref 80.0–100.0)
pO2, Arterial: 69 mmHg — ABNORMAL LOW (ref 80.0–100.0)

## 2014-06-27 LAB — GLUCOSE, CAPILLARY
GLUCOSE-CAPILLARY: 92 mg/dL (ref 70–99)
GLUCOSE-CAPILLARY: 114 mg/dL — AB (ref 70–99)
Glucose-Capillary: 112 mg/dL — ABNORMAL HIGH (ref 70–99)
Glucose-Capillary: 122 mg/dL — ABNORMAL HIGH (ref 70–99)
Glucose-Capillary: 132 mg/dL — ABNORMAL HIGH (ref 70–99)
Glucose-Capillary: 84 mg/dL (ref 70–99)

## 2014-06-27 LAB — LACTIC ACID, PLASMA: Lactic Acid, Venous: 4.2 mmol/L — ABNORMAL HIGH (ref 0.5–2.2)

## 2014-06-27 LAB — PHOSPHORUS: PHOSPHORUS: 2.7 mg/dL (ref 2.3–4.6)

## 2014-06-27 LAB — PROCALCITONIN: Procalcitonin: 26.47 ng/mL

## 2014-06-27 MED ORDER — VITAL AF 1.2 CAL PO LIQD
1000.0000 mL | ORAL | Status: DC
  Administered 2014-06-27: 1000 mL
  Filled 2014-06-27 (×4): qty 1000

## 2014-06-27 MED ORDER — VITAL HIGH PROTEIN PO LIQD: 1000.0000 mL | ORAL | Status: DC

## 2014-06-27 MED ORDER — FUROSEMIDE 10 MG/ML IJ SOLN
INTRAMUSCULAR | Status: AC
Start: 2014-06-27 — End: 2014-06-27
  Filled 2014-06-27: qty 2

## 2014-06-27 MED ORDER — FUROSEMIDE 10 MG/ML IJ SOLN
20.0000 mg | Freq: Once | INTRAMUSCULAR | Status: AC
  Administered 2014-06-27: 20 mg via INTRAVENOUS

## 2014-06-27 MED ORDER — FUROSEMIDE 10 MG/ML IJ SOLN
20.0000 mg | Freq: Two times a day (BID) | INTRAMUSCULAR | Status: DC
  Administered 2014-06-27 – 2014-06-28 (×2): 20 mg via INTRAVENOUS
  Filled 2014-06-27 (×4): qty 2

## 2014-06-27 NOTE — Progress Notes (Signed)
INITIAL NUTRITION ASSESSMENT  DOCUMENTATION CODES Per approved criteria  -Not Applicable   INTERVENTION: Initiate Vital AF 1.2 @ 20 ml/hr via OGT and increase by 10 ml every 4 hours to goal rate of 65 ml/hr.   Tube feeding regimen provides 1872 kcal (98% of needs), 117 grams of protein, and 1265 ml of H2O.    NUTRITION DIAGNOSIS: Inadequate oral intake related to inability to eat as evidenced by NPO/Vent Status.   Goal: Pt to meet >/= 90% of their estimated nutrition needs   Monitor:  Vent status, TF initiation/tolerance, weight trend, labs  Reason for Assessment: Consult for enteral/tube feeding initiation and management  81 y.o. male  Admitting Dx: <principal problem not specified>  ASSESSMENT: 81 year old male, developed 4 days chest pain history that worsened and diagnsed with acute anterolateral MI and s/p brief V Fib arrest on way to hospital with ROSC and mental status but cardiogenic shock on leveophed and dopamine.   Patient is currently intubated on ventilator support. OGT on place. Note mild muscle wasting of temples and clavicles.  MV: 11 L/min Temp (24hrs), Avg:100.4 F (38 C), Min:97.2 F (36.2 C), Max:102 F (38.9 C)  Propofol: none  Labs: low calcium, low GFR, low hemoglobin   Height: Ht Readings from Last 1 Encounters:  07/22/2014 5\' 10"  (1.778 m)    Weight: Wt Readings from Last 1 Encounters:  07/10/2014 150 lb (68.04 kg)    Ideal Body Weight: 166 lbs  % Ideal Body Weight: 90%  Wt Readings from Last 10 Encounters:  07/01/2014 150 lb (68.04 kg)  05/26/14 157 lb (71.215 kg)  05/13/14 156 lb 6 oz (70.931 kg)  05/07/14 157 lb 12.8 oz (71.578 kg)  04/23/14 159 lb 6.4 oz (72.303 kg)  03/18/14 149 lb 12.8 oz (67.949 kg)  02/18/14 151 lb (68.493 kg)  11/20/13 153 lb 14.4 oz (69.809 kg)  10/23/13 150 lb (68.04 kg)  10/08/13 154 lb 6.4 oz (70.035 kg)    Usual Body Weight: 150 lbs  % Usual Body Weight: 100%  BMI:  Body mass index is 21.52  kg/(m^2).  Estimated Nutritional Needs: Kcal: 1915 Protein: 100-110 grams Fluid: 2.2 L/day  Skin: +2 RLE and LLE edema  Diet Order: Diet NPO time specified  EDUCATION NEEDS: -No education needs identified at this time   Intake/Output Summary (Last 24 hours) at 06/27/14 0912 Last data filed at 06/27/14 0900  Gross per 24 hour  Intake 3086.05 ml  Output    507 ml  Net 2579.05 ml    Last BM: 1/1   Labs:   Recent Labs Lab 07/01/2014 0818 07/19/2014 0936 06/27/14 0055  NA 141 142 139  K 3.0* 3.3* 3.9  CL 104 106 104  CO2 22 21 24   BUN 24* 24* 27*  CREATININE 2.20* 2.29* 2.67*  CALCIUM 7.1* 7.3* 7.3*  MG  --  1.9 2.2  PHOS  --   --  2.7  GLUCOSE 320* 305* 136*    CBG (last 3)   Recent Labs  07/02/2014 2336 06/27/14 0434 06/27/14 0750  GLUCAP 122* 132* 112*    Scheduled Meds: . ampicillin-sulbactam (UNASYN) IV  1.5 g Intravenous Q12H  . antiseptic oral rinse  7 mL Mouth Rinse QID  . aspirin  81 mg Oral Daily  . atorvastatin  80 mg Oral q1800  . chlorhexidine  15 mL Mouth Rinse BID  . clopidogrel  75 mg Oral Q breakfast  . fentaNYL  100 mcg Intravenous Once  .  heparin  5,000 Units Subcutaneous 3 times per day  . insulin aspart  2-6 Units Subcutaneous 6 times per day  . midazolam  4 mg Intravenous Once  . pantoprazole (PROTONIX) IV  40 mg Intravenous Q24H  . promethazine  12.5 mg Intravenous Once  . sodium chloride  3 mL Intravenous Q12H    Continuous Infusions: . norepinephrine (LEVOPHED) Adult infusion 16 mcg/min (06/27/14 0900)  .  sodium bicarbonate  infusion 1000 mL Stopped (06/27/14 0900)    Past Medical History  Diagnosis Date  . CAD (coronary artery disease)     S/P CABG and multiple PCI's  . Hyperlipidemia   . HTN (hypertension)   . AAA (abdominal aortic aneurysm)     5 cm on 2011 assessment  . Gout 2009  . Elevated PSA     Dr Rosana Hoes  . Myocardial infarction   . Renal insufficiency   . Glaucoma   . Central retinal vein occlusion of  left eye   . Skin cancer of face   . Ulcer 07/23/2012    tongue  . Anemia     normocytic  . Myocardial infarction     Past Surgical History  Procedure Laterality Date  . Coronary artery bypass graft  1981  . Hernia repair    . Inner ear surgery    . Abdominal aortic endovascular stent graft N/A 10/08/2013    Procedure: ABDOMINAL AORTIC ENDOVASCULAR STENT GRAFT;  Surgeon: Elam Dutch, MD;  Location: [facility];  Service: Vascular;  Laterality: N/A;  . Embolization Right 10/08/2013    Procedure: COIL EMBOLIZATION (NESTERS)- RIGHT INTERNAL ILIAC ANEURYSM;  Surgeon: Elam Dutch, MD;  Location: [facility];  Service: Vascular;  Laterality: Right;  . Left heart catheterization with coronary/graft angiogram N/A 07/20/2014    Procedure: LEFT HEART CATHETERIZATION WITH Beatrix Fetters;  Surgeon: Blane Ohara, MD;  Location: [facility] CATH LAB;  Service: Cardiovascular;  Laterality: N/A;    Pryor Ochoa RD, LDN Inpatient Clinical Dietitian Pager: [phone] After Hours Pager: [phone]

## 2014-06-27 NOTE — Procedures (Signed)
Extubation Procedure Note  Patient Details:   Name: Robert Ritter DOB: 07-04-1919 MRN: [id number]   Pt extubated per MD order after PS 5/5 for 20 minutes. Pt VC 1.5L, NIF -22. Pt placed on 4L [place], Sats 95%. Pt is able to speak name/location. Pt able to cough and clear secretions. RT will monitor.   Evaluation  O2 sats: stable throughout Complications: No apparent complications Patient did tolerate procedure well. Bilateral Breath Sounds: Clear, Diminished Suctioning: Airway Yes  Sharen Hint 06/27/2014, 10:41 PM

## 2014-06-27 NOTE — Progress Notes (Signed)
Patient Name: Robert Ritter      SUBJECTIVE: 37 you with remote CABG and PCI 2006 who presented 1/1 with acute ALMI.  Notwithstanding age, given functional status, he was taken for emergent PCI and underwent DES stenting of SVG>>LAD (VF enroute)  Cath Native LM/LAD/LCx?RCA occluded Saphenous pain grafts sequential to LAD and first diagonal: Initially the graft was totally occluded in the mid body just proximal to the previously stented segment. After reperfusion, the graft is seen to be patent with mild stenosis in the segment between the first diagonal branch and LAD. The percent stenosis is estimated at 40%. The native LAD after reperfusion is patent to the apex. There is distal occlusion of the most apical portion of the vessel. The diagonal branches are also shown to be patent.  Saphenous pain grafts sequential to the PDA, PLA, and obtuse marginal branches of the circumflex is patent   Course complicated by cardiogenic shock and respiratory failure VT nonsustained  He is intubated but denies chest pain   Alert and weaning started  Still on pressors     Past Medical History  Diagnosis Date  . CAD (coronary artery disease)     S/P CABG and multiple PCI's  . Hyperlipidemia   . HTN (hypertension)   . AAA (abdominal aortic aneurysm)     5 cm on 2011 assessment  . Gout 2009  . Elevated PSA     Dr Rosana Hoes  . Myocardial infarction   . Renal insufficiency   . Glaucoma   . Central retinal vein occlusion of left eye   . Skin cancer of face   . Ulcer 07/23/2012    tongue  . Anemia     normocytic  . Myocardial infarction     Scheduled Meds:  Scheduled Meds: . ampicillin-sulbactam (UNASYN) IV  1.5 g Intravenous Q12H  . antiseptic oral rinse  7 mL Mouth Rinse QID  . aspirin  81 mg Oral Daily  . atorvastatin  80 mg Oral q1800  . chlorhexidine  15 mL Mouth Rinse BID  . clopidogrel  75 mg Oral Q breakfast  . fentaNYL  100 mcg Intravenous Once  . heparin  5,000  Units Subcutaneous 3 times per day  . insulin aspart  2-6 Units Subcutaneous 6 times per day  . midazolam  4 mg Intravenous Once  . pantoprazole (PROTONIX) IV  40 mg Intravenous Q24H  . promethazine  12.5 mg Intravenous Once  . sodium chloride  3 mL Intravenous Q12H   Continuous Infusions: . norepinephrine (LEVOPHED) Adult infusion 16 mcg/min (06/27/14 0900)  .  sodium bicarbonate  infusion 1000 mL Stopped (06/27/14 0900)   sodium chloride, Place/Maintain arterial line **AND** sodium chloride, acetaminophen (TYLENOL) oral liquid 160 mg/5 mL, acetaminophen, docusate **OR** docusate, fentaNYL, fentaNYL, morphine injection, nitroGLYCERIN, ondansetron (ZOFRAN) IV, sodium chloride    PHYSICAL EXAM Filed Vitals:   06/27/14 0700 06/27/14 0800 06/27/14 0900 06/27/14 0905  BP: 103/55 102/63 105/37 105/37  Pulse: 70 76 78 77  Temp: 100 F (37.8 C) 100.2 F (37.9 C) 100.4 F (38 C) 100.4 F (38 C)  TempSrc:      Resp: 24 24 24 22   Height:      Weight:      SpO2: 100% 100% 99% 100%    General appearance: alert, cooperative and intubated Lungs: clear to auscultation bilaterally Heart: regular rate and rhythm, S1, S2 normal, no murmur, click, rub or gallop Abdomen: soft, non-tender; bowel sounds  normal; no masses,  no organomegaly Extremities: extremities normal, atraumatic, no cyanosis or edema Skin: Skin color, texture, turgor normal. No rashes or lesions Neurologic: Grossly normal alert   TELEMETRY: Reviewed telemetry pt in *NSR with freq AIVR but diminished VT NS*:    Intake/Output Summary (Last 24 hours) at 06/27/14 0925 Last data filed at 06/27/14 0900  Gross per 24 hour  Intake 3086.05 ml  Output    507 ml  Net 2579.05 ml    LABS: Basic Metabolic Panel:  Recent Labs Lab 07/24/2014 0259 07/26/2014 0309 07/16/2014 0818 07/02/2014 0936 06/27/14 0055  NA 140 141 141 142 139  K 3.5 3.4* 3.0* 3.3* 3.9  CL 112 109 104 106 104  CO2 17*  --  22 21 24   GLUCOSE 113* 114* 320*  305* 136*  BUN 22 24* 24* 24* 27*  CREATININE 1.85* 1.80* 2.20* 2.29* 2.67*  CALCIUM 7.9*  --  7.1* 7.3* 7.3*  MG  --   --   --  1.9 2.2  PHOS  --   --   --   --  2.7   Cardiac Enzymes:  Recent Labs  06/28/2014 0259  TROPONINI 0.06*   CBC:  Recent Labs Lab 07/19/2014 0259 06/27/2014 0309 07/26/2014 0936 06/27/14 0055  WBC 6.0  --  21.6* 20.5*  NEUTROABS 3.4  --   --  17.4*  HGB 9.2* 10.2* 9.9* 10.5*  HCT 29.8* 30.0* 31.8* 32.5*  MCV 94.6  --  93.0 90.8  PLT 105*  --  166 237   PROTIME:  Recent Labs  07/16/2014 0259 07/02/2014 0818  LABPROT 15.1 27.1*  INR 1.18 2.49*   Liver Function Tests:  Recent Labs  07/13/2014 0259 07/07/2014 0936  AST 22 497*  ALT 12 187*  ALKPHOS 59 79  BILITOT 0.4 0.8  PROT 5.6* 5.4*  ALBUMIN 3.0* 2.8*   No results for input(s): LIPASE, AMYLASE in the last 72 hours. BNP: BNP (last 3 results) No results for input(s): PROBNP in the last 8760 hours. D-Dimer: No results for input(s): DDIMER in the last 72 hours. Hemoglobin A1C: No results for input(s): HGBA1C in the last 72 hours. Fasting Lipid Panel:  Recent Labs  07/16/2014 0818  CHOL 95  HDL 33*  LDLCALC 51  TRIG 55  CHOLHDL 2.9   Thyroid Function Tests:    ASSESSMENT AND PLAN:  Active Problems:   Cardiogenic shock   Heart attack   Acute respiratory failure with hypoxia   Lactic acidosis   Ventricular tachycardia, non-sustained Pt still in shock pressor dependent but clinically improved    VT has settled down as another marker of stability--hold off on therapy LFT abnormalities were impressive>>will repeat Weaning per CCM but there plan is to see if he can be weaned from prssors first, and I would concur    Signed, Virl Axe MD  06/27/2014

## 2014-06-27 NOTE — Progress Notes (Signed)
PULMONARY / CRITICAL CARE MEDICINE   Name: Robert Ritter MRN: [id number] DOB: May 03, 1920   PCP Walker Kehr, MD   ADMISSION DATE:  07/05/2014 CONSULTATION DATE:  07/23/2014   REFERRING MD :  Dr Gerrit Halls  CHIEF COMPLAINT:  Cardiogenic shock, acute resp failure   HISTORY OF PRESENT ILLNESS:  Functional 81 year old male, developed 4 days chest pain history that worsened and diagnsed with acute anterolateral MI and s/p brief V Fib arrest on way to hospital with ROSC and mental status but cardiogenic shock on leveophed and dopamine. EMergent cath lab: and s/p Promus DES stent LAD/Segment distal body of SVG but returned to CCU in cardiogenic shock, acidosis, and resp failure. PCCM consuled for acute resp failure - needing intubation and medical mgmt. Full code     SIGNIFICANT EVENTS: 07/02/2014 - admit  Subjective :  1/2 >follows commands, appropriate Echo this am /US tech at bedside Pressor support is decreased  VT runs overnight    VITAL SIGNS: Temp:  [94.6 F (34.8 C)-102 F (38.9 C)] 100.2 F (37.9 C) (01/02 0800) Pulse Rate:  [52-120] 76 (01/02 0800) Resp:  [15-28] 24 (01/02 0800) BP: (102-131)/(55-79) 102/63 mmHg (01/02 0800) SpO2:  [92 %-100 %] 100 % (01/02 0800) Arterial Line BP: (75-139)/(43-70) 119/69 mmHg (01/02 0800) FiO2 (%):  [40 %-60 %] 40 % (01/02 0800) HEMODYNAMICS:   VENTILATOR SETTINGS: Vent Mode:  [-] PRVC FiO2 (%):  [40 %-60 %] 40 % Set Rate:  [24 bmp] 24 bmp Vt Set:  [550 mL] 550 mL PEEP:  [5 cmH20] 5 cmH20 Plateau Pressure:  [16 cmH20-17 cmH20] 17 cmH20 INTAKE / OUTPUT:  Intake/Output Summary (Last 24 hours) at 06/27/14 9702 Last data filed at 06/27/14 0600  Gross per 24 hour  Intake 2920.05 ml  Output    467 ml  Net 2453.05 ml    PHYSICAL EXAMINATION: General:  Elderly male. Younger looking than stated age ` Neuro:  Alert, able to follow comand  . Moves all 4s HEENT:  NEck supple, PEERL + Cardiovascular:  On pressors. HEart sounds  + Lungs:  Coarse BS  Abdomen:  Soft, non tendr GU: looks normal. Right femoral sheath + Musculoskeletal:  No cyanosis, no clubbing, tr  edema Skin:  Intact anteriorly  LABS: PULMONARY  Recent Labs Lab 07/05/2014 0309 07/11/2014 0456 07/08/2014 0613 06/27/14 0443  PHART  --  7.181* 7.164* 7.573*  PCO2ART  --  36.5 39.2 27.6*  PO2ART  --  75.0* 65.0* 140.0*  HCO3  --  13.7* 14.1* 25.2*  TCO2 17 15 15 26   O2SAT  --  91.0 86.0 99.0    CBC  Recent Labs Lab 07/14/2014 0259 07/05/2014 0309 07/22/2014 0936 06/27/14 0055  HGB 9.2* 10.2* 9.9* 10.5*  HCT 29.8* 30.0* 31.8* 32.5*  WBC 6.0  --  21.6* 20.5*  PLT 105*  --  166 237    COAGULATION  Recent Labs Lab 07/06/2014 0259 07/19/2014 0818  INR 1.18 2.49*    CARDIAC    Recent Labs Lab 07/25/2014 0259  TROPONINI 0.06*   No results for input(s): PROBNP in the last 168 hours.   CHEMISTRY  Recent Labs Lab 07/01/2014 0259 07/04/2014 0309 07/02/2014 0818 07/22/2014 0936 06/27/14 0055  NA 140 141 141 142 139  K 3.5 3.4* 3.0* 3.3* 3.9  CL 112 109 104 106 104  CO2 17*  --  22 21 24   GLUCOSE 113* 114* 320* 305* 136*  BUN 22 24* 24* 24* 27*  CREATININE 1.85*  1.80* 2.20* 2.29* 2.67*  CALCIUM 7.9*  --  7.1* 7.3* 7.3*  MG  --   --   --  1.9 2.2  PHOS  --   --   --   --  2.7   Estimated Creatinine Clearance: 17 mL/min (by C-G formula based on Cr of 2.67).   LIVER  Recent Labs Lab 07/19/2014 0259 07/14/2014 0818 07/05/2014 0936  AST 22  --  497*  ALT 12  --  187*  ALKPHOS 59  --  79  BILITOT 0.4  --  0.8  PROT 5.6*  --  5.4*  ALBUMIN 3.0*  --  2.8*  INR 1.18 2.49*  --      INFECTIOUS  Recent Labs Lab 07/14/2014 0309 07/01/2014 0930 06/27/14 0040 06/27/14 0055  LATICACIDVEN 2.41* 7.1* 4.2*  --   PROCALCITON  --  1.42  --  26.47     ENDOCRINE CBG (last 3)   Recent Labs  07/07/2014 2336 06/27/14 0434 06/27/14 0750  GLUCAP 122* 132* 112*         IMAGING x48h Dg Chest Port 1 View  07/10/2014   CLINICAL DATA:   Status post endotracheal tube placement  EXAM: PORTABLE CHEST - 1 VIEW  COMPARISON:  June 26, 2014 3:18 a.m.  FINDINGS: The heart size and mediastinal contours are stable. The heart size is enlarged. Endotracheal tube is identified with distal tip 5.7 cm from carina. There is severe pulmonary edema. There is no pleural effusion. There is no pneumothorax. The visualized skeletal structures are stable.  IMPRESSION: Endotracheal tube in good position. There is no pneumothorax. Severe pulmonary edema.   Electronically Signed   By: Abelardo Diesel M.D.   On: 07/13/2014 08:56   Dg Chest Port 1 View  07/20/2014   CLINICAL DATA:  Unresponsive  EXAM: PORTABLE CHEST - 1 VIEW  COMPARISON:  05/05/2014  FINDINGS: Cardiomegaly which is similar to previous. There is mild aortic tortuosity that is also unchanged. Extensive artifact over the chest. There is mild interstitial coarsening but no overt edema. No pneumonia, effusion, or pneumothorax.  IMPRESSION: 1. No pneumonia or definitive edema. 2. Unchanged cardiomegaly.   Electronically Signed   By: Jorje Guild M.D.   On: 07/14/2014 04:12   Dg Abd Portable 1v  07/17/2014   CLINICAL DATA:  Orogastric tube placement.  EXAM: PORTABLE ABDOMEN - 1 VIEW  COMPARISON:  CT scan 04/23/2014.  FINDINGS: External pacer paddles are noted overlying the lower chest and upper abdomen. The NG tube tip is in the body region of stomach against the lateral wall. Aortic stent graft is again demonstrated.  IMPRESSION: NG tube tip is in the body region of the stomach.   Electronically Signed   By: Kalman Jewels M.D.   On: 07/24/2014 09:46       ASSESSMENT / PLAN:  PULMONARY OETT 07/05/2014 >>  A:Acute respiratory failure due to acidosis and cardiogenic shock with possible/likely pulmonary edema P:   Full Mechanical vent support Assess for weaning  Monitor with abg and cxr  CARDIOVASCULAR CVLRt groin femoral sheat 06/27/2014 >  A: Anterolateral STEMI ,. S./p PCI Cardiogenic  shock Brief V FIb arrest en route to hospital 06/27/2014  P:  Wean pressors as able  Echo this am    RENAL A:  Profound acidosis due to cardiogenic shock.  Renal failure - 1/2 Lactate improving, HCO3 nml  P:   Recheck abg in am  D/c Bicarb gtt Strict I/O   GASTROINTESTINAL A:  nause and vomit pre intubation but not sure about aspiration P:   PPI NPO  TF in am if unable to extubate  HEMATOLOGIC A:  At risk for anemia due to bleeding and critical illness P:  PRBC for goal hgb > 8gm% in setting of MI  INFECTIOUS A:  Fever /Leukocytosis  Cover for possible aspiration PNA w/ n/v preintubation 1/2 PCT tr up 1.42>26.4   P:   1/1 Urine culture>> 1/1 Unasyn >>  ENDOCRINE A:  Nil acute   P:   ssi  NEUROLOGIC A:  Normal mental status post arrest P:   RASS goal: 0 to -2 PAD protocol #1 with fent prn   CODE     Code Status Orders        Start     Ordered   07/25/2014 0634  Full code   Continuous     07/12/2014 0633       FAMILY  - Updates: done by Dr Burt Knack . Full code for now. D/w Dr Burt Knack multiple times  - Inter-disciplinary family meet or Palliative Care meeting due by: day 7 which is 07/03/13    Tammy Parrett NP-C  [facility]  [phone]   06/27/2014 8:33 AM

## 2014-06-27 NOTE — Progress Notes (Signed)
  Echocardiogram 2D Echocardiogram has been performed.  Robert Ritter 06/27/2014, 9:04 AM

## 2014-06-28 ENCOUNTER — Inpatient Hospital Stay (HOSPITAL_COMMUNITY): Payer: Medicare Other

## 2014-06-28 DIAGNOSIS — N183 Chronic kidney disease, stage 3 unspecified: Secondary | ICD-10-CM | POA: Diagnosis present

## 2014-06-28 DIAGNOSIS — N179 Acute kidney failure, unspecified: Secondary | ICD-10-CM | POA: Diagnosis present

## 2014-06-28 DIAGNOSIS — J690 Pneumonitis due to inhalation of food and vomit: Secondary | ICD-10-CM | POA: Diagnosis present

## 2014-06-28 LAB — CBC WITH DIFFERENTIAL/PLATELET
BASOS PCT: 0 % (ref 0–1)
Basophils Absolute: 0 10*3/uL (ref 0.0–0.1)
Eosinophils Absolute: 0 10*3/uL (ref 0.0–0.7)
Eosinophils Relative: 0 % (ref 0–5)
HEMATOCRIT: 31.1 % — AB (ref 39.0–52.0)
Hemoglobin: 9.9 g/dL — ABNORMAL LOW (ref 13.0–17.0)
Lymphocytes Relative: 8 % — ABNORMAL LOW (ref 12–46)
Lymphs Abs: 1.4 10*3/uL (ref 0.7–4.0)
MCH: 29.3 pg (ref 26.0–34.0)
MCHC: 31.8 g/dL (ref 30.0–36.0)
MCV: 92 fL (ref 78.0–100.0)
MONOS PCT: 6 % (ref 3–12)
Monocytes Absolute: 1.1 10*3/uL — ABNORMAL HIGH (ref 0.1–1.0)
NEUTROS PCT: 86 % — AB (ref 43–77)
Neutro Abs: 15.5 10*3/uL — ABNORMAL HIGH (ref 1.7–7.7)
PLATELETS: 167 10*3/uL (ref 150–400)
RBC: 3.38 MIL/uL — ABNORMAL LOW (ref 4.22–5.81)
RDW: 15.2 % (ref 11.5–15.5)
WBC: 18 10*3/uL — ABNORMAL HIGH (ref 4.0–10.5)

## 2014-06-28 LAB — COMPREHENSIVE METABOLIC PANEL
ALK PHOS: 62 U/L (ref 39–117)
ALT: 169 U/L — AB (ref 0–53)
AST: 213 U/L — AB (ref 0–37)
Albumin: 2.3 g/dL — ABNORMAL LOW (ref 3.5–5.2)
Anion gap: 9 (ref 5–15)
BILIRUBIN TOTAL: 0.4 mg/dL (ref 0.3–1.2)
BUN: 40 mg/dL — ABNORMAL HIGH (ref 6–23)
CO2: 26 mmol/L (ref 19–32)
Calcium: 7.3 mg/dL — ABNORMAL LOW (ref 8.4–10.5)
Chloride: 102 mEq/L (ref 96–112)
Creatinine, Ser: 3.28 mg/dL — ABNORMAL HIGH (ref 0.50–1.35)
GFR calc Af Amer: 17 mL/min — ABNORMAL LOW (ref >90)
GFR calc non Af Amer: 15 mL/min — ABNORMAL LOW (ref >90)
GLUCOSE: 107 mg/dL — AB (ref 70–99)
POTASSIUM: 4 mmol/L (ref 3.5–5.1)
Sodium: 137 mmol/L (ref 135–145)
Total Protein: 5.1 g/dL — ABNORMAL LOW (ref 6.0–8.3)

## 2014-06-28 LAB — GLUCOSE, CAPILLARY
GLUCOSE-CAPILLARY: 93 mg/dL (ref 70–99)
GLUCOSE-CAPILLARY: 136 mg/dL — AB (ref 70–99)
Glucose-Capillary: 125 mg/dL — ABNORMAL HIGH (ref 70–99)
Glucose-Capillary: 98 mg/dL (ref 70–99)
Glucose-Capillary: 99 mg/dL (ref 70–99)
Glucose-Capillary: 99 mg/dL (ref 70–99)

## 2014-06-28 LAB — MAGNESIUM: Magnesium: 2.3 mg/dL (ref 1.5–2.5)

## 2014-06-28 LAB — PHOSPHORUS: PHOSPHORUS: 5.8 mg/dL — AB (ref 2.3–4.6)

## 2014-06-28 LAB — PROCALCITONIN: Procalcitonin: 22.7 ng/mL

## 2014-06-28 LAB — BRAIN NATRIURETIC PEPTIDE: B NATRIURETIC PEPTIDE 5: 2213.1 pg/mL — AB (ref 0.0–100.0)

## 2014-06-28 MED ORDER — INSULIN ASPART 100 UNIT/ML ~~LOC~~ SOLN
2.0000 [IU] | Freq: Three times a day (TID) | SUBCUTANEOUS | Status: DC
  Administered 2014-06-28 (×2): 2 [IU] via SUBCUTANEOUS

## 2014-06-28 MED ORDER — FUROSEMIDE 10 MG/ML IJ SOLN
40.0000 mg | Freq: Once | INTRAMUSCULAR | Status: DC
  Filled 2014-06-28: qty 4

## 2014-06-28 MED ORDER — FUROSEMIDE 10 MG/ML IJ SOLN
20.0000 mg | Freq: Once | INTRAMUSCULAR | Status: AC
Start: 2014-06-28 — End: 2014-06-28
  Administered 2014-06-28: 20 mg via INTRAVENOUS

## 2014-06-28 MED ORDER — ATROPINE SULFATE 0.1 MG/ML IJ SOLN
INTRAMUSCULAR | Status: AC
  Filled 2014-06-28: qty 10

## 2014-06-28 NOTE — Progress Notes (Signed)
PULMONARY / CRITICAL CARE MEDICINE   Name: Robert Ritter MRN: [id number] DOB: [birth year]   PCP Walker Kehr, MD   ADMISSION DATE:  07/21/2014 CONSULTATION DATE:  07/05/2014   REFERRING MD :  Dr Gerrit Halls  CHIEF COMPLAINT:  Cardiogenic shock, acute resp failure   HISTORY OF PRESENT ILLNESS:  Functional 81 year old male, developed 4 days chest pain history that worsened and diagnsed with acute anterolateral MI and s/p brief V Fib arrest on way to hospital with ROSC and mental status but cardiogenic shock on leveophed and dopamine. EMergent cath lab: and s/p Promus DES stent LAD/Segment distal body of SVG but returned to CCU in cardiogenic shock, acidosis, and resp failure. PCCM consuled for acute resp failure - needing intubation and medical mgmt. Full code     SIGNIFICANT EVENTS: 06/30/2014 - card arrest/intubated/admit/cath w/ stent 1/2 extubated  1/2 Echo >  Subjective :  1/3 >follows commands, appropriate Extubated yest. Doing very well w/ good sats.  Decreased pressor demand     VITAL SIGNS: Temp:  [99.3 F (37.4 C)-101.3 F (38.5 C)] 99.3 F (37.4 C) (01/03 0600) Pulse Rate:  [64-78] 66 (01/03 0600) Resp:  [12-25] 23 (01/03 0600) BP: (78-142)/(37-72) 114/60 mmHg (01/03 0600) SpO2:  [94 %-100 %] 99 % (01/03 0600) Arterial Line BP: (89-130)/(47-61) 121/56 mmHg (01/03 0600) FiO2 (%):  [30 %-40 %] 30 % (01/02 2000) Weight:  [79.8 kg (175 lb 14.8 oz)] 79.8 kg (175 lb 14.8 oz) (01/03 0500) HEMODYNAMICS:   VENTILATOR SETTINGS: Vent Mode:  [-] PRVC FiO2 (%):  [30 %-40 %] 30 % Set Rate:  [24 bmp] 24 bmp Vt Set:  [550 mL] 550 mL PEEP:  [5 cmH20] 5 cmH20 Pressure Support:  [5 cmH20] 5 cmH20 Plateau Pressure:  [16 cmH20] 16 cmH20 INTAKE / OUTPUT:  Intake/Output Summary (Last 24 hours) at 06/28/14 0819 Last data filed at 06/28/14 0600  Gross per 24 hour  Intake    991 ml  Output    335 ml  Net    656 ml    PHYSICAL EXAMINATION: General:  Elderly male. Younger  looking than stated age ` Neuro:  Alert, able to follow comand  . Moves all 4s HEENT:  NEck supple, PEERL + Cardiovascular:  On pressors. HEart sounds + Lungs:  Coarse BS  Abdomen:  Soft, non tendr GU: looks normal. Right femoral sheath + Musculoskeletal:  No cyanosis, no clubbing, tr  edema Skin:  Intact anteriorly  LABS: PULMONARY  Recent Labs Lab 07/23/2014 0309 07/13/2014 0456 07/18/2014 0613 06/27/14 0443 06/27/14 1624  PHART  --  7.181* 7.164* 7.573* 7.470*  PCO2ART  --  36.5 39.2 27.6* 33.7*  PO2ART  --  75.0* 65.0* 140.0* 69.0*  HCO3  --  13.7* 14.1* 25.2* 24.2*  TCO2 17 15 15 26 25   O2SAT  --  91.0 86.0 99.0 94.0    CBC  Recent Labs Lab 07/19/2014 0936 06/27/14 0055 06/28/14 0431  HGB 9.9* 10.5* 9.9*  HCT 31.8* 32.5* 31.1*  WBC 21.6* 20.5* 18.0*  PLT 166 237 167    COAGULATION  Recent Labs Lab 07/26/2014 0259 07/08/2014 0818  INR 1.18 2.49*    CARDIAC    Recent Labs Lab 07/02/2014 0259  TROPONINI 0.06*   No results for input(s): PROBNP in the last 168 hours.   CHEMISTRY  Recent Labs Lab 07/15/2014 0259 07/02/2014 0309 06/30/2014 0818 07/23/2014 0936 06/27/14 0055 06/28/14 0431  NA 140 141 141 142 139 137  K 3.5 3.4*  3.0* 3.3* 3.9 4.0  CL 112 109 104 106 104 102  CO2 17*  --  22 21 24 26   GLUCOSE 113* 114* 320* 305* 136* 107*  BUN 22 24* 24* 24* 27* 40*  CREATININE 1.85* 1.80* 2.20* 2.29* 2.67* 3.28*  CALCIUM 7.9*  --  7.1* 7.3* 7.3* 7.3*  MG  --   --   --  1.9 2.2 2.3  PHOS  --   --   --   --  2.7 5.8*   Estimated Creatinine Clearance: 14.8 mL/min (by C-G formula based on Cr of 3.28).   LIVER  Recent Labs Lab 07/06/2014 0259 07/23/2014 0818 07/02/2014 0936 06/28/14 0431  AST 22  --  497* 213*  ALT 12  --  187* 169*  ALKPHOS 59  --  79 62  BILITOT 0.4  --  0.8 0.4  PROT 5.6*  --  5.4* 5.1*  ALBUMIN 3.0*  --  2.8* 2.3*  INR 1.18 2.49*  --   --      INFECTIOUS  Recent Labs Lab 07/11/2014 0309 07/22/2014 0930 06/27/14 0040  06/27/14 0055 06/28/14 0431  LATICACIDVEN 2.41* 7.1* 4.2*  --   --   PROCALCITON  --  1.42  --  26.47 22.70     ENDOCRINE CBG (last 3)   Recent Labs  06/27/14 1930 06/27/14 2334 06/28/14 0425  GLUCAP 114* 98 93         IMAGING x48h Dg Chest Port 1 View  06/28/2014   CLINICAL DATA:  Acute respiratory failure.  Shortness of breath.  EXAM: PORTABLE CHEST - 1 VIEW  COMPARISON:  One day prior  FINDINGS: Support apparatus: Prior median sternotomy. Removal endotracheal and nasogastric tubes. There is either a mediastinal drain or a left-sided chest tube. Overlying external pacer/defibrillator.  Cardiomediastinal silhouette: Midline trachea. Cardiomegaly accentuated by AP portable technique.  Lungs: Similar right and slight increase in left-sided interstitial and airspace disease. Slightly lower lobe predominant with sparing of the left apex.  Pleura:  No pleural effusion or pneumothorax.  Other: None  IMPRESSION: Slight worsening aeration, favored to be due to increased congestive heart failure.   Electronically Signed   By: Abigail Miyamoto M.D.   On: 06/28/2014 07:42   Dg Chest Port 1 View  06/27/2014   CLINICAL DATA:  Endotracheal tube placement.  Respiratory failure.  EXAM: PORTABLE CHEST - 1 VIEW  COMPARISON:  07/21/2014  FINDINGS: Endotracheal tube tip 5.9 cm above the carina. Nasogastric tube enters the stomach.  Improved pulmonary edema, now moderate. Moderate enlargement of the cardiopericardial silhouette.  IMPRESSION: 1. Improved congestive heart failure, now moderate in degree.   Electronically Signed   By: Sherryl Barters M.D.   On: 06/27/2014 09:26   Dg Chest Port 1 View  07/11/2014   CLINICAL DATA:  Status post endotracheal tube placement  EXAM: PORTABLE CHEST - 1 VIEW  COMPARISON:  June 26, 2014 3:18 a.m.  FINDINGS: The heart size and mediastinal contours are stable. The heart size is enlarged. Endotracheal tube is identified with distal tip 5.7 cm from carina. There is severe  pulmonary edema. There is no pleural effusion. There is no pneumothorax. The visualized skeletal structures are stable.  IMPRESSION: Endotracheal tube in good position. There is no pneumothorax. Severe pulmonary edema.   Electronically Signed   By: Abelardo Diesel M.D.   On: 06/30/2014 08:56   Dg Abd Portable 1v  07/12/2014   CLINICAL DATA:  Orogastric tube placement.  EXAM: PORTABLE ABDOMEN -  1 VIEW  COMPARISON:  CT scan 04/23/2014.  FINDINGS: External pacer paddles are noted overlying the lower chest and upper abdomen. The NG tube tip is in the body region of stomach against the lateral wall. Aortic stent graft is again demonstrated.  IMPRESSION: NG tube tip is in the body region of the stomach.   Electronically Signed   By: Kalman Jewels M.D.   On: 07/09/2014 09:46       ASSESSMENT / PLAN:  PULMONARY OETT 07/22/2014 >>  A:Acute respiratory failure due to acidosis and cardiogenic shock with possible/likely pulmonary edema>extubated 1/2  Suspected Aspiration PNA  P:   O2 to keep sats >90% Cont ABX -see ID sxn    CARDIOVASCULAR CVLRt groin femoral sheat 07/10/2014 >  A: Anterolateral STEMI ,. S./p PCI Cardiogenic shock>echo 1/2 w/ EF 25-30%, mod LA dilated  Brief V FIb arrest en route to hospital 07/15/2014  P:  Wean pressors as able  Goal neg to even bal  Cont plavix     RENAL A:  Profound acidosis due to cardiogenic shock. >resolved  Renal failure -scr tr up suspect d/t shock, contrast  1/2 lactate improving   P:   Check bmet in am  Cont gentle diuresis per cards    GASTROINTESTINAL A:  nause and vomit pre intubation but not sure about aspiration Transaminitis ? Shock liver >LFT tr down  P:   PPI Clear liquid diet -adv as tolerated Check LFT ./INR in am   HEMATOLOGIC A:  At risk for anemia due to bleeding and critical illness  P:  PRBC for goal hgb > 8gm% in setting of MI Hep sq   INFECTIOUS A:  Fever /Leukocytosis  Cover for possible aspiration PNA w/ n/v  preintubation 1/3 PCT tr dow 1.42>26.4 >22   P:   1/1 Urine culture>> 1/1 Unasyn >>  ENDOCRINE A:  Hyperglycemia  P:   ssi  NEUROLOGIC A:  Normal mental status post arrest P:   Minimize sedation   CODE     Code Status Orders        Start     Ordered   07/09/2014 0634  Full code   Continuous     07/16/2014 0633       FAMILY  - Updates: done by Dr Burt Knack . Full code for now. D/w Dr Burt Knack multiple times No family at bedside 1/3   - Inter-disciplinary family meet or Palliative Care meeting due by: day 7 which is 07/03/13    Emiah Pellicano NP-C  [facility]  [phone]   06/28/2014 8:19 AM

## 2014-06-28 NOTE — Progress Notes (Signed)
Patient Name: Robert Ritter      SUBJECTIVE: 89 you with remote CABG and PCI 2006 who presented 1/1 with acute ALMI.  Notwithstanding age, given functional status, he was taken for emergent PCI and underwent DES stenting of SVG>>LAD (VF enroute)  Cath Native LM/LAD/LCx?RCA occluded Saphenous pain grafts sequential to LAD and first diagonal: Initially the graft was totally occluded in the mid body just proximal to the previously stented segment. After reperfusion, the graft is seen to be patent with mild stenosis in the segment between the first diagonal branch and LAD. The percent stenosis is estimated at 40%. The native LAD after reperfusion is patent to the apex. There is distal occlusion of the most apical portion of the vessel. The diagonal branches are also shown to be patent.  Saphenous pain grafts sequential to the PDA, PLA, and obtuse marginal branches of the circumflex is patent   Course complicated by cardiogenic shock and respiratory failure VT nonsustained  Tmax 101.3 last pm  Extubated yesterday Without chest pain or shortness of breath     Past Medical History  Diagnosis Date  . CAD (coronary artery disease)     S/P CABG and multiple PCI's  . Hyperlipidemia   . HTN (hypertension)   . AAA (abdominal aortic aneurysm)     5 cm on 2011 assessment  . Gout 2009  . Elevated PSA     Dr Rosana Hoes  . Myocardial infarction   . Renal insufficiency   . Glaucoma   . Central retinal vein occlusion of left eye   . Skin cancer of face   . Ulcer 07/23/2012    tongue  . Anemia     normocytic  . Myocardial infarction     Scheduled Meds:  Scheduled Meds: . ampicillin-sulbactam (UNASYN) IV  1.5 g Intravenous Q12H  . antiseptic oral rinse  7 mL Mouth Rinse QID  . aspirin  81 mg Oral Daily  . atorvastatin  80 mg Oral q1800  . chlorhexidine  15 mL Mouth Rinse BID  . clopidogrel  75 mg Oral Q breakfast  . furosemide  20 mg Intravenous BID  . heparin  5,000 Units  Subcutaneous 3 times per day  . insulin aspart  2-6 Units Subcutaneous 6 times per day  . pantoprazole (PROTONIX) IV  40 mg Intravenous Q24H  . sodium chloride  3 mL Intravenous Q12H   Continuous Infusions: . feeding supplement (VITAL AF 1.2 CAL) 1,000 mL (06/27/14 1711)  . norepinephrine (LEVOPHED) Adult infusion 5 mcg/min (06/27/14 2000)   sodium chloride, Place/Maintain arterial line **AND** sodium chloride, acetaminophen, docusate **OR** docusate, fentaNYL, fentaNYL, morphine injection, nitroGLYCERIN, ondansetron (ZOFRAN) IV, sodium chloride    PHYSICAL EXAM Filed Vitals:   06/28/14 0300 06/28/14 0400 06/28/14 0500 06/28/14 0600  BP: 107/61 112/59 103/51 114/60  Pulse: 67 64 66 66  Temp: 99.9 F (37.7 C) 99.5 F (37.5 C) 99.5 F (37.5 C) 99.3 F (37.4 C)  TempSrc:  Core (Comment)    Resp: 21 24 15 23   Height:      Weight:   175 lb 14.8 oz (79.8 kg)   SpO2: 99% 98% 95% 99%    General appearance: alert, cooperative and intubated Lungs:tubulr sound R base JVP 10  Heart: regular rate and rhythm, S1, S2 normal, no murmur, click, rub or gallop Abdomen: soft, non-tender; bowel sounds normal; no masses,  no organomegaly Extremities: extremities normal, atraumatic, no cyanosis  +dema Skin: Skin color, texture,  turgor normal. No rashes or lesions Neurologic: Grossly normal alert   TELEMETRY: Reviewed telemetry pt in *NSR with freq AIVR but diminished VT NS*:    Intake/Output Summary (Last 24 hours) at 06/28/14 0818 Last data filed at 06/28/14 0600  Gross per 24 hour  Intake    991 ml  Output    335 ml  Net    656 ml     LABS: Basic Metabolic Panel:  Recent Labs Lab 07/04/2014 0259 07/20/2014 0309 07/08/2014 0818  07/26/2014 0936 06/27/14 0055 06/28/14 0431  NA 140 141 141  --  142 139 137  K 3.5 3.4* 3.0*  --  3.3* 3.9 4.0  CL 112 109 104  --  106 104 102  CO2 17*  --  22  --  21 24 26   GLUCOSE 113* 114* 320*  --  305* 136* 107*  BUN 22 24* 24*  --  24* 27* 40*    CREATININE 1.85* 1.80* 2.20*  --  2.29* 2.67* 3.28*  CALCIUM 7.9*  --  7.1*  --  7.3* 7.3* 7.3*  MG  --   --   --   < > 1.9 2.2 2.3  PHOS  --   --   --   --   --  2.7 5.8*  < > = values in this interval not displayed. Cardiac Enzymes:  Recent Labs  07/02/2014 0259  TROPONINI 0.06*   CBC:  Recent Labs Lab 07/10/2014 0259 06/29/2014 0309 07/17/2014 0936 06/27/14 0055 06/28/14 0431  WBC 6.0  --  21.6* 20.5* 18.0*  NEUTROABS 3.4  --   --  17.4* 15.5*  HGB 9.2* 10.2* 9.9* 10.5* 9.9*  HCT 29.8* 30.0* 31.8* 32.5* 31.1*  MCV 94.6  --  93.0 90.8 92.0  PLT 105*  --  166 237 167   PROTIME:  Recent Labs  07/09/2014 0259 07/20/2014 0818  LABPROT 15.1 27.1*  INR 1.18 2.49*   Liver Function Tests:  Recent Labs  07/07/2014 0936 06/28/14 0431  AST 497* 213*  ALT 187* 169*  ALKPHOS 79 62  BILITOT 0.8 0.4  PROT 5.4* 5.1*  ALBUMIN 2.8* 2.3*   No results for input(s): LIPASE, AMYLASE in the last 72 hours. BNP: BNP (last 3 results) No results for input(s): PROBNP in the last 8760 hours. D-Dimer: No results for input(s): DDIMER in the last 72 hours. Hemoglobin A1C: No results for input(s): HGBA1C in the last 72 hours. Fasting Lipid Panel:  Recent Labs  07/21/2014 0818  CHOL 95  HDL 33*  LDLCALC 51  TRIG 55  CHOLHDL 2.9   Thyroid Function Tests:    ASSESSMENT AND PLAN:  Active Problems:   Cardiogenic shock   Heart attack   Acute respiratory failure with hypoxia   Lactic acidosis   Ventricular tachycardia, non-sustained   Acute renal failure superimposed on stage 3 chronic kidney disease central line in place for pressors  Will try and wean and get sheath out Continue gentle diuresis with volume overload notwithstanding the above and renal issues Will follow Cr  prob combination of shock and contrast in setting of CIKD  Hopefully approaching apogee Transaminases are trending down, presumably from shock to liver  Fever and lung exam suggest aspiration  This will be  potentially problem  On ABX   Signed, Virl Axe MD  06/28/2014

## 2014-06-29 ENCOUNTER — Inpatient Hospital Stay (HOSPITAL_COMMUNITY): Payer: Medicare Other

## 2014-06-29 DIAGNOSIS — I25700 Atherosclerosis of coronary artery bypass graft(s), unspecified, with unstable angina pectoris: Secondary | ICD-10-CM

## 2014-06-29 DIAGNOSIS — N179 Acute kidney failure, unspecified: Secondary | ICD-10-CM

## 2014-06-29 DIAGNOSIS — N183 Chronic kidney disease, stage 3 (moderate): Secondary | ICD-10-CM

## 2014-06-29 DIAGNOSIS — J810 Acute pulmonary edema: Secondary | ICD-10-CM

## 2014-06-29 DIAGNOSIS — I10 Essential (primary) hypertension: Secondary | ICD-10-CM

## 2014-06-29 DIAGNOSIS — I2109 ST elevation (STEMI) myocardial infarction involving other coronary artery of anterior wall: Secondary | ICD-10-CM

## 2014-06-29 DIAGNOSIS — E785 Hyperlipidemia, unspecified: Secondary | ICD-10-CM

## 2014-06-29 DIAGNOSIS — Z9861 Coronary angioplasty status: Secondary | ICD-10-CM

## 2014-06-29 DIAGNOSIS — I5041 Acute combined systolic (congestive) and diastolic (congestive) heart failure: Secondary | ICD-10-CM | POA: Diagnosis present

## 2014-06-29 DIAGNOSIS — I2510 Atherosclerotic heart disease of native coronary artery without angina pectoris: Secondary | ICD-10-CM

## 2014-06-29 LAB — CBC WITH DIFFERENTIAL/PLATELET
BASOS ABS: 0 10*3/uL (ref 0.0–0.1)
BASOS PCT: 0 % (ref 0–1)
EOS PCT: 0 % (ref 0–5)
Eosinophils Absolute: 0 10*3/uL (ref 0.0–0.7)
HEMATOCRIT: 33.7 % — AB (ref 39.0–52.0)
Hemoglobin: 10.1 g/dL — ABNORMAL LOW (ref 13.0–17.0)
Lymphocytes Relative: 3 % — ABNORMAL LOW (ref 12–46)
Lymphs Abs: 0.5 10*3/uL — ABNORMAL LOW (ref 0.7–4.0)
MCH: 27.7 pg (ref 26.0–34.0)
MCHC: 30 g/dL (ref 30.0–36.0)
MCV: 92.3 fL (ref 78.0–100.0)
MONO ABS: 0.7 10*3/uL (ref 0.1–1.0)
Monocytes Relative: 4 % (ref 3–12)
NEUTROS PCT: 93 % — AB (ref 43–77)
Neutro Abs: 14.2 10*3/uL — ABNORMAL HIGH (ref 1.7–7.7)
Platelets: 148 10*3/uL — ABNORMAL LOW (ref 150–400)
RBC: 3.65 MIL/uL — ABNORMAL LOW (ref 4.22–5.81)
RDW: 15 % (ref 11.5–15.5)
WBC: 15.3 10*3/uL — ABNORMAL HIGH (ref 4.0–10.5)

## 2014-06-29 LAB — GLUCOSE, CAPILLARY
GLUCOSE-CAPILLARY: 104 mg/dL — AB (ref 70–99)
Glucose-Capillary: 103 mg/dL — ABNORMAL HIGH (ref 70–99)
Glucose-Capillary: 118 mg/dL — ABNORMAL HIGH (ref 70–99)
Glucose-Capillary: 118 mg/dL — ABNORMAL HIGH (ref 70–99)

## 2014-06-29 LAB — POCT I-STAT 3, ART BLOOD GAS (G3+)
ACID-BASE DEFICIT: 11 mmol/L — AB (ref 0.0–2.0)
Acid-base deficit: 7 mmol/L — ABNORMAL HIGH (ref 0.0–2.0)
Bicarbonate: 16.9 mEq/L — ABNORMAL LOW (ref 20.0–24.0)
Bicarbonate: 20.1 mEq/L (ref 20.0–24.0)
O2 SAT: 98 %
O2 Saturation: 93 %
PH ART: 7.196 — AB (ref 7.350–7.450)
TCO2: 18 mmol/L (ref 0–100)
TCO2: 21 mmol/L (ref 0–100)
pCO2 arterial: 43.6 mmHg (ref 35.0–45.0)
pCO2 arterial: 43.7 mmHg (ref 35.0–45.0)
pH, Arterial: 7.271 — ABNORMAL LOW (ref 7.350–7.450)
pO2, Arterial: 80 mmHg (ref 80.0–100.0)
pO2, Arterial: 112 mmHg — ABNORMAL HIGH (ref 80.0–100.0)

## 2014-06-29 LAB — COMPREHENSIVE METABOLIC PANEL
ALBUMIN: 2.5 g/dL — AB (ref 3.5–5.2)
ALT: 153 U/L — ABNORMAL HIGH (ref 0–53)
ANION GAP: 15 (ref 5–15)
AST: 126 U/L — AB (ref 0–37)
Alkaline Phosphatase: 69 U/L (ref 39–117)
BILIRUBIN TOTAL: 1.1 mg/dL (ref 0.3–1.2)
BUN: 51 mg/dL — ABNORMAL HIGH (ref 6–23)
CHLORIDE: 101 meq/L (ref 96–112)
CO2: 23 mmol/L (ref 19–32)
CREATININE: 3.82 mg/dL — AB (ref 0.50–1.35)
Calcium: 7.5 mg/dL — ABNORMAL LOW (ref 8.4–10.5)
GFR calc Af Amer: 14 mL/min — ABNORMAL LOW (ref >90)
GFR calc non Af Amer: 12 mL/min — ABNORMAL LOW (ref >90)
Glucose, Bld: 109 mg/dL — ABNORMAL HIGH (ref 70–99)
POTASSIUM: 4.8 mmol/L (ref 3.5–5.1)
Sodium: 139 mmol/L (ref 135–145)
TOTAL PROTEIN: 5.7 g/dL — AB (ref 6.0–8.3)

## 2014-06-29 LAB — BRAIN NATRIURETIC PEPTIDE: B Natriuretic Peptide: 2024.2 pg/mL — ABNORMAL HIGH (ref 0.0–100.0)

## 2014-06-29 LAB — PROTIME-INR
INR: 1.1 (ref 0.00–1.49)
PROTHROMBIN TIME: 14.4 s (ref 11.6–15.2)

## 2014-06-29 LAB — LACTIC ACID, PLASMA: Lactic Acid, Venous: 2.4 mmol/L — ABNORMAL HIGH (ref 0.5–2.2)

## 2014-06-29 LAB — CLOSTRIDIUM DIFFICILE BY PCR: Toxigenic C. Difficile by PCR: NEGATIVE

## 2014-06-29 LAB — PHOSPHORUS: Phosphorus: 6.8 mg/dL — ABNORMAL HIGH (ref 2.3–4.6)

## 2014-06-29 LAB — MAGNESIUM: Magnesium: 2.5 mg/dL (ref 1.5–2.5)

## 2014-06-29 LAB — TROPONIN I: Troponin I: >80 ng/mL (ref <0.031)

## 2014-06-29 LAB — POCT ACTIVATED CLOTTING TIME: ACTIVATED CLOTTING TIME: 491 s

## 2014-06-29 MED ORDER — FUROSEMIDE 10 MG/ML IJ SOLN
INTRAMUSCULAR | Status: AC
  Filled 2014-06-29: qty 8

## 2014-06-29 MED ORDER — DIPHENHYDRAMINE HCL 25 MG PO CAPS
25.0000 mg | ORAL_CAPSULE | Freq: Four times a day (QID) | ORAL | Status: DC | PRN
  Administered 2014-06-29: 25 mg via ORAL
  Filled 2014-06-29: qty 1

## 2014-06-29 MED ORDER — HALOPERIDOL LACTATE 5 MG/ML IJ SOLN
5.0000 mg | Freq: Four times a day (QID) | INTRAMUSCULAR | Status: DC | PRN
  Administered 2014-06-30: 5 mg via INTRAVENOUS
  Filled 2014-06-29: qty 1

## 2014-06-29 MED ORDER — SODIUM CHLORIDE 0.9 % IJ SOLN
10.0000 mL | Freq: Two times a day (BID) | INTRAMUSCULAR | Status: DC
  Administered 2014-06-29 – 2014-07-02 (×7): 10 mL

## 2014-06-29 MED ORDER — FUROSEMIDE 80 MG PO TABS
80.0000 mg | ORAL_TABLET | Freq: Two times a day (BID) | ORAL | Status: DC
  Filled 2014-06-29 (×3): qty 1

## 2014-06-29 MED ORDER — HALOPERIDOL LACTATE 5 MG/ML IJ SOLN
4.0000 mg | Freq: Once | INTRAMUSCULAR | Status: AC
  Administered 2014-06-29: 4 mg via INTRAMUSCULAR
  Filled 2014-06-29: qty 1

## 2014-06-29 MED ORDER — SODIUM CHLORIDE 0.9 % IJ SOLN
10.0000 mL | INTRAMUSCULAR | Status: DC | PRN
  Administered 2014-06-29: 10 mL
  Filled 2014-06-29: qty 40

## 2014-06-29 MED ORDER — FUROSEMIDE 10 MG/ML IJ SOLN
80.0000 mg | Freq: Two times a day (BID) | INTRAMUSCULAR | Status: DC
  Administered 2014-06-29 – 2014-06-30 (×2): 80 mg via INTRAVENOUS
  Filled 2014-06-29 (×3): qty 8

## 2014-06-29 MED ORDER — TRAZODONE HCL 50 MG PO TABS
50.0000 mg | ORAL_TABLET | Freq: Every evening | ORAL | Status: DC | PRN
  Administered 2014-06-29: 50 mg via ORAL
  Filled 2014-06-29 (×2): qty 1

## 2014-06-29 MED FILL — Sodium Chloride IV Soln 0.9%: INTRAVENOUS | Qty: 50 | Status: AC

## 2014-06-29 NOTE — Progress Notes (Signed)
IV team unable to get IV site. Advised ultrasound would have to be used. Made day shift RN aware.

## 2014-06-29 NOTE — Progress Notes (Signed)
ANTIBIOTIC CONSULT NOTE - FOLLOW UP  Pharmacy Consult for Unasyn Indication: aspiration pneumonia  Allergies  Allergen Reactions  . Sulfa Antibiotics Rash    Patient Measurements: Height: 5\' 10"  (177.8 cm) Weight: 172 lb 6.4 oz (78.2 kg) IBW/kg (Calculated) : 73  Vital Signs: Temp: 97.3 F (36.3 C) (01/04 1242) Temp Source: Oral (01/04 1242) BP: 123/77 mmHg (01/04 1242) Pulse Rate: 73 (01/04 1243) Intake/Output from previous day: 01/03 0701 - 01/04 0700 In: 372 [P.O.:120; I.V.:152; IV Piggyback:100] Out: 410 [Urine:410] Intake/Output from this shift: Total I/O In: 40 [P.O.:40] Out: -   Labs:  Recent Labs  06/27/14 0055 06/28/14 0431 06/29/14 0540 06/29/14 0802  WBC 20.5* 18.0*  --  15.3*  HGB 10.5* 9.9*  --  10.1*  PLT 237 167  --  148*  CREATININE 2.67* 3.28* 3.82*  --    Estimated Creatinine Clearance: 12.7 mL/min (by C-G formula based on Cr of 3.82).  Assessment: 89yom continues on day #4 unasyn for possible aspiration pneumonia. Renal function is worsening but dose still remains appropriate.   Unasyn 1/1 >> 1/1 Urine Cx>> CoNS - sensitivities pending  Goal of Therapy:  Resolution of infection  Plan:  1) Continue unasyn 1.5g IV q12 2) Continue to follow renal function 3) Follow up urine culture sensitivities  Deboraha Sprang 06/29/2014,1:46 PM

## 2014-06-29 NOTE — Progress Notes (Signed)
56 you with remote CABG and PCI 2006 who presented 1/1 with acute ALMI. Notwithstanding age, given functional status, he was taken for emergent PCI and underwent DES stenting of SVG>>LAD (VF enroute)  Cath Native LM/LAD/LCx?RCA occluded Saphenous pain grafts sequential to LAD and first diagonal: Initially the graft was totally occluded in the mid body just proximal to the previously stented segment. After reperfusion, the graft is seen to be patent with mild stenosis in the segment between the first diagonal branch and LAD. The percent stenosis is estimated at 40%. The native LAD after reperfusion is patent to the apex. There is distal occlusion of the most apical portion of the vessel. The diagonal branches are also shown to be patent.  Saphenous pain grafts sequential to the PDA, PLA, and obtuse marginal branches of the circumflex is patent   Course complicated by cardiogenic shock and respiratory failure VT nonsustained  Subjective:  Feels very SOB this AM - is on NRB mask with 100% FiO2 - SaO2 84%.   Denies any CP  Objective:  Vital Signs in the last 24 hours: Temp:  [97.4 F (36.3 C)-100 F (37.8 C)] 97.4 F (36.3 C) (01/04 0730) Pulse Rate:  [65-91] 73 (01/04 1000) Resp:  [18-41] 27 (01/04 1000) BP: (95-153)/(46-72) 128/63 mmHg (01/04 1000) SpO2:  [77 %-95 %] 87 % (01/04 1000) Arterial Line BP: (72-145)/(44-133) 74/58 mmHg (01/03 1600) FiO2 (%):  [55 %] 55 % (01/03 2200) Weight:  [172 lb 6.4 oz (78.2 kg)] 172 lb 6.4 oz (78.2 kg) (01/04 0500)  Intake/Output from previous day: 01/03 0701 - 01/04 0700 In: 372 [P.O.:120; I.V.:152; IV Piggyback:100] Out: 410 [Urine:410] Intake/Output from this shift: Total I/O In: 19 [P.O.:40] Out: -   Physical Exam: General appearance: alert, cooperative, appears stated age, moderate distress and increased respiratory effort Neck: no adenopathy, no carotid bruit and mild JVP - but with increased neck muscle use for breathing - difficult  to assess Lungs: diffuse bibasilar rales & expiratory rhonchi; increased WOB Heart: regular rate and rhythm, S1, S2 normal and + S4, + 2/6 HSM @ apex Abdomen: soft, non-tender; bowel sounds normal; no masses,  no organomegaly and abdominal muscle use for respiration Extremities: edema 1-2+ BLE, & pitting UE edema Pulses: 2+ and symmetric Skin: diffuse ecchymoses from venopuncture Neurologic: Grossly normal   Lab Results:  Recent Labs  06/28/14 0431 06/29/14 0802  WBC 18.0* 15.3*  HGB 9.9* 10.1*  PLT 167 148*    Recent Labs  06/28/14 0431 06/29/14 0540  NA 137 139  K 4.0 4.8  CL 102 101  CO2 26 23  GLUCOSE 107* 109*  BUN 40* 51*  CREATININE 3.28* 3.82*    Recent Labs  06/29/14 0540  TROPONINI >80.00*   Hepatic Function Panel  Recent Labs  06/29/14 0540  PROT 5.7*  ALBUMIN 2.5*  AST 126*  ALT 153*  ALKPHOS 69  BILITOT 1.1   No results for input(s): CHOL in the last 72 hours. No results for input(s): PROTIME in the last 72 hours.  Imaging: Imaging results have been reviewed - by my view, Bilateral effusions with interstitial infiltrate suggesting pulmonary edema  Cardiac Studies:  Assessment/Plan:  Principal Problem:   Acute MI anterior wall first episode care Active Problems:   Essential hypertension   Coronary atherosclerosis of artery bypass graft   Acute respiratory failure with hypoxia   Ventricular tachycardia, non-sustained   Acute renal failure superimposed on stage 3 chronic kidney disease   Aspiration pneumonia  Acute combined systolic and diastolic heart failure   CAD S/P percutaneous coronary angioplasty: PCI to SVG-LAD   Hyperlipidemia  Resolved Cardiogenic Shock with Lactic Acidosis; was complicated by Shock Liver & A on C Renal Failure.  Acute Hypoxic Resp Failure again this AM - most likely related to Acute CHF --> increased WOB, contacted PCCM who will see today-> both myself & PCCM PA had long discussion with pt. Plan for now  is per my initial thought -> BiPAP, Lasix (will need IV access, so PO ordered). Will need to confirm with family, but will likely move to DNI (+/- DNR).  Rx with Lasix - IV when we have access  Already more comfortable with BiPAP => appreciate PCCM assistance  Continue Abx for now as there was concern for Asp PNA.   S/p PCI to SVG-LAD - on ASA, Plavix; holding statin due to Shock Liver   Shock Liver - (will follow LFTs)  A on C Renal Failure - follow Cr, but will need to diurese (will hold off on afterload reduction in order to optimize renal blood flow); Despite poor renal Fxn, will ask for PICC line placement for IV access & blood draws (would not likely be a HD candidate & long term access not as vital).  No further VT on Tele, no BB or antiarrythmic for now - awaiting return of hepatic/renal fxn & stable BP  Is on IV Abx for presumed Asp PNA - can probably de-escalate once more clinically stable - defer to PCCM    Overall, long-term prognosis is poor with new Anterior WMA & reduced EF with underlying severe CAD & CKD-3 (~4).  Will continue Code Status & End Of Life Discussion    The patient is critically ill with multiple organ system failure requiring extensive medical decision making & patient discussions, consultations. Total Critical Care Time 60 min.   LOS: 3 days    HARDING, DAVID W 06/29/2014, 10:55 AM

## 2014-06-29 NOTE — Progress Notes (Signed)
PULMONARY / CRITICAL CARE MEDICINE   Name: JAME MORRELL MRN: [id number] DOB: 07/30/1919   PCP Walker Kehr, MD   ADMISSION DATE:  07/04/2014 CONSULTATION DATE:  07/15/2014   REFERRING MD :  Dr Gerrit Halls  CHIEF COMPLAINT:  Cardiogenic shock, acute resp failure   HISTORY OF PRESENT ILLNESS:  Functional 81 year old male, developed 4 days chest pain history that worsened and diagnsed with acute anterolateral MI and s/p brief V Fib arrest on way to hospital with ROSC and mental status but cardiogenic shock on leveophed and dopamine. EMergent cath lab: and s/p Promus DES stent LAD/Segment distal body of SVG but returned to CCU in cardiogenic shock, acidosis, and resp failure. PCCM consuled for acute resp failure - needing intubation and medical mgmt. Full code    SIGNIFICANT EVENTS: 07/24/2014 - card arrest/intubated/admit/cath w/ stent 1/2 extubated  1/2 Echo >25-30%, mild-mod MR, mod dilated LA  Subjective :  Worsening hypoxia, dyspnea overnight.  Sats 80% on NRB.     VITAL SIGNS: Temp:  [97.4 F (36.3 C)-100 F (37.8 C)] 97.4 F (36.3 C) (01/04 0730) Pulse Rate:  [65-91] 73 (01/04 1000) Resp:  [18-41] 27 (01/04 1000) BP: (95-153)/(46-72) 128/63 mmHg (01/04 1000) SpO2:  [77 %-95 %] 87 % (01/04 1000) Arterial Line BP: (72-145)/(44-133) 74/58 mmHg (01/03 1600) FiO2 (%):  [55 %] 55 % (01/03 2200) Weight:  [172 lb 6.4 oz (78.2 kg)] 172 lb 6.4 oz (78.2 kg) (01/04 0500) HEMODYNAMICS:   VENTILATOR SETTINGS: Vent Mode:  [-]  FiO2 (%):  [55 %] 55 % INTAKE / OUTPUT:  Intake/Output Summary (Last 24 hours) at 06/29/14 1106 Last data filed at 06/29/14 1000  Gross per 24 hour  Intake  316.9 ml  Output    345 ml  Net  -28.1 ml    PHYSICAL EXAMINATION: General:  Elderly male. Acutely ill appearing  ` Neuro:  Alert, awake, appropriate, oriented. Moves all 4s HEENT:  Neck supple, PEERL + Cardiovascular:  s1s2 distant  Lungs:  resps even, mildly labored on NRB, coarse,  diminished bases  Abdomen:  Soft, non tendr Musculoskeletal:  No cyanosis, no clubbing,  tr edema   LABS: PULMONARY  Recent Labs Lab 07/11/2014 0309 07/20/2014 0456 07/06/2014 0613 06/27/14 0443 06/27/14 1624  PHART  --  7.181* 7.164* 7.573* 7.470*  PCO2ART  --  36.5 39.2 27.6* 33.7*  PO2ART  --  75.0* 65.0* 140.0* 69.0*  HCO3  --  13.7* 14.1* 25.2* 24.2*  TCO2 17 15 15 26 25   O2SAT  --  91.0 86.0 99.0 94.0    CBC  Recent Labs Lab 06/27/14 0055 06/28/14 0431 06/29/14 0802  HGB 10.5* 9.9* 10.1*  HCT 32.5* 31.1* 33.7*  WBC 20.5* 18.0* 15.3*  PLT 237 167 148*    COAGULATION  Recent Labs Lab 06/29/2014 0259 07/24/2014 0818 06/29/14 0337  INR 1.18 2.49* 1.10    CARDIAC    Recent Labs Lab 06/27/2014 0259 06/29/14 0540  TROPONINI 0.06* >80.00*   No results for input(s): PROBNP in the last 168 hours.   CHEMISTRY  Recent Labs Lab 07/17/2014 0818 07/16/2014 0936 06/27/14 0055 06/28/14 0431 06/29/14 0540  NA 141 142 139 137 139  K 3.0* 3.3* 3.9 4.0 4.8  CL 104 106 104 102 101  CO2 22 21 24 26 23   GLUCOSE 320* 305* 136* 107* 109*  BUN 24* 24* 27* 40* 51*  CREATININE 2.20* 2.29* 2.67* 3.28* 3.82*  CALCIUM 7.1* 7.3* 7.3* 7.3* 7.5*  MG  --  1.9 2.2 2.3 2.5  PHOS  --   --  2.7 5.8* 6.8*   Estimated Creatinine Clearance: 12.7 mL/min (by C-G formula based on Cr of 3.82).   LIVER  Recent Labs Lab 07/25/2014 0259 07/02/2014 0818 07/15/2014 0936 06/28/14 0431 06/29/14 0337 06/29/14 0540  AST 22  --  497* 213*  --  126*  ALT 12  --  187* 169*  --  153*  ALKPHOS 59  --  79 62  --  69  BILITOT 0.4  --  0.8 0.4  --  1.1  PROT 5.6*  --  5.4* 5.1*  --  5.7*  ALBUMIN 3.0*  --  2.8* 2.3*  --  2.5*  INR 1.18 2.49*  --   --  1.10  --      INFECTIOUS  Recent Labs Lab 07/09/2014 0930 06/27/14 0040 06/27/14 0055 06/28/14 0431 06/29/14 0337  LATICACIDVEN 7.1* 4.2*  --   --  2.4*  PROCALCITON 1.42  --  26.47 22.70  --      ENDOCRINE CBG (last 3)   Recent  Labs  06/28/14 1648 06/28/14 1945 06/29/14 0729  GLUCAP 136* 125* 103*      IMAGING x48h Dg Chest Port 1 View  06/29/2014   CLINICAL DATA:  Pneumonia  EXAM: PORTABLE CHEST - 1 VIEW  COMPARISON:  06/28/2014  FINDINGS: Postsurgical changes are again seen. The cardiac shadow is stable. Bibasilar atelectatic changes are noted with associated effusions. Mild vascular congestion remains.  IMPRESSION: Persistent vascular congestion.  Increasing density in the bases bilaterally consistent with atelectasis and effusions.   Electronically Signed   By: Inez Catalina M.D.   On: 06/29/2014 07:43   Dg Chest Port 1 View  06/28/2014   CLINICAL DATA:  Acute respiratory failure.  Shortness of breath.  EXAM: PORTABLE CHEST - 1 VIEW  COMPARISON:  One day prior  FINDINGS: Support apparatus: Prior median sternotomy. Removal endotracheal and nasogastric tubes. There is either a mediastinal drain or a left-sided chest tube. Overlying external pacer/defibrillator.  Cardiomediastinal silhouette: Midline trachea. Cardiomegaly accentuated by AP portable technique.  Lungs: Similar right and slight increase in left-sided interstitial and airspace disease. Slightly lower lobe predominant with sparing of the left apex.  Pleura:  No pleural effusion or pneumothorax.  Other: None  IMPRESSION: Slight worsening aeration, favored to be due to increased congestive heart failure.   Electronically Signed   By: Abigail Miyamoto M.D.   On: 06/28/2014 07:42       ASSESSMENT / PLAN:  PULMONARY OETT 06/27/2014 >>1/2 Acute respiratory failure -  due to acidosis and cardiogenic shock with pulmonary edema.  Extubated 1/2.  Now worsening 1/4 with hypoxia, dyspnea.  Suspected Aspiration PNA  P:   bipap  Diuresis per cards  F/u CXR  See discussion below  No further ABG  Cont abx as below   CARDIOVASCULAR CVL Rt groin femoral sheat 07/22/2014 >1/2 Anterolateral STEMI - S/p PCI 1/1 Cardiogenic shock - resolved  Brief V FIb arrest en route  to hospital 06/30/2014 Acute combined CHF  P:  Lasix per cards - PICC currently being placed  Goal neg to even bal  Cont plavix, asa bipap as above   RENAL Profound acidosis due to cardiogenic shock. >resolved  Acute renal failure - r/t shock/hypotension, contrast  P:   F/u chem  F/u lactate  Cont gentle diuresis per cards    GASTROINTESTINAL Nausea/vomiting - preadmission with ?aspiration  Transaminitis ? Shock liver >LFT tr down  P:   PPI NPO on bipap  F/u LFT's   HEMATOLOGIC No active issue  P:  PRBC for goal hgb > 8gm% in setting of MI Hep sq   INFECTIOUS A:  Fever /Leukocytosis  Possible aspiration PNA   P:   1/1 Urine culture>>coag neg staph  1/1 Unasyn >>  ENDOCRINE A:  Hyperglycemia  P:   ssi  NEUROLOGIC A:  Normal mental status post arrest P:   Minimize sedation     Discussed with Dr. Ellyn Hack and pt at length at bedside.  Pt is alert, oriented and appropriate. He says "you realize I am 81 years old, right?".  Discussed poor overall prognosis and persistent respiratory failure which is certainly multifactorial but most significantly from decompensated heart failure, cardiogenic shock and STEMI.  He wants to continue medical care including bipap and lasix but does NOT want reintubation and would want to consider transition to comfort care if he were to fail bipap. Have attempted to contact his wife and daughter without answer.  Will make DNI for now.  Continue discussions regarding symptom management depending on response to bipap and diuresis. Will update family when they are available.     Nickolas Madrid, NP 06/29/2014  11:06 AM Pager: [phone] or [phone]     PCCM ATTENDING: I have reviewed pt's initial presentation, consultants notes and hospital database in detail.  The above assessment and plan was formulated under my direction.  In summary: Recurrent acute respiratory failure in a very elderly man with cardiomyopathy and  recurrent pulmonary edema. He is lucid and indicates that he wishes to forgo intubation. Will support with NPPV as needed and continue diuresis   Merton Border, MD;  PCCM service; Mobile [phone]

## 2014-06-29 NOTE — Progress Notes (Signed)
[facility] Progress Note Patient Name: VIHAN SANTAGATA DOB: [birth year] MRN: [id number]   Date of Service  06/29/2014  HPI/Events of Note  Agitation, pulling at mask, hasn't slept in days  eICU Interventions  Haldol now (still early evening), then trazodone to night for bedtime     Intervention Category Minor Interventions: Agitation / anxiety - evaluation and management  Dayne Chait 06/29/2014, 6:54 PM

## 2014-06-29 NOTE — Progress Notes (Signed)
Pt on NRB today, not ready for our services. Will f/u tomorrow. Yves Dill CES, ACSM 8:52 AM 06/29/2014

## 2014-06-29 NOTE — Progress Notes (Signed)
Informed by lab tech pt has been stuck 4 times and still unable to get AM labs. On call physician Dr Claiborne Billings paged. Will continue to monitor.

## 2014-06-29 NOTE — Progress Notes (Signed)
Peripherally Inserted Central Catheter/Midline Placement  The IV Nurse has discussed with the patient and/or persons authorized to consent for the patient, the purpose of this procedure and the potential benefits and risks involved with this procedure.  The benefits include less needle sticks, lab draws from the catheter and patient may be discharged home with the catheter.  Risks include, but not limited to, infection, bleeding, blood clot (thrombus formation), and puncture of an artery; nerve damage and irregular heat beat.  Alternatives to this procedure were also discussed.  PICC/Midline Placement Documentation        Robert Ritter 06/29/2014, 12:20 PM Verbal consent obtained due to patient shaking and weak.  Primary RN was 2nd witness.

## 2014-06-30 DIAGNOSIS — G9341 Metabolic encephalopathy: Secondary | ICD-10-CM

## 2014-06-30 DIAGNOSIS — T508X5A Adverse effect of diagnostic agents, initial encounter: Secondary | ICD-10-CM

## 2014-06-30 DIAGNOSIS — N170 Acute kidney failure with tubular necrosis: Secondary | ICD-10-CM

## 2014-06-30 DIAGNOSIS — N141 Nephropathy induced by other drugs, medicaments and biological substances: Secondary | ICD-10-CM

## 2014-06-30 LAB — CBC WITH DIFFERENTIAL/PLATELET
BASOS PCT: 0 % (ref 0–1)
Basophils Absolute: 0 10*3/uL (ref 0.0–0.1)
Eosinophils Absolute: 0 10*3/uL (ref 0.0–0.7)
Eosinophils Relative: 0 % (ref 0–5)
HCT: 31.5 % — ABNORMAL LOW (ref 39.0–52.0)
HEMOGLOBIN: 9.6 g/dL — AB (ref 13.0–17.0)
Lymphocytes Relative: 4 % — ABNORMAL LOW (ref 12–46)
Lymphs Abs: 0.5 10*3/uL — ABNORMAL LOW (ref 0.7–4.0)
MCH: 27.8 pg (ref 26.0–34.0)
MCHC: 30.5 g/dL (ref 30.0–36.0)
MCV: 91.3 fL (ref 78.0–100.0)
MONOS PCT: 5 % (ref 3–12)
Monocytes Absolute: 0.7 10*3/uL (ref 0.1–1.0)
NEUTROS ABS: 12.2 10*3/uL — AB (ref 1.7–7.7)
Neutrophils Relative %: 91 % — ABNORMAL HIGH (ref 43–77)
PLATELETS: 141 10*3/uL — AB (ref 150–400)
RBC: 3.45 MIL/uL — AB (ref 4.22–5.81)
RDW: 15 % (ref 11.5–15.5)
WBC: 13.4 10*3/uL — ABNORMAL HIGH (ref 4.0–10.5)

## 2014-06-30 LAB — BASIC METABOLIC PANEL
ANION GAP: 19 — AB (ref 5–15)
BUN: 64 mg/dL — ABNORMAL HIGH (ref 6–23)
CALCIUM: 7.6 mg/dL — AB (ref 8.4–10.5)
CO2: 22 mmol/L (ref 19–32)
CREATININE: 4.02 mg/dL — AB (ref 0.50–1.35)
Chloride: 99 mEq/L (ref 96–112)
GFR, EST AFRICAN AMERICAN: 14 mL/min — AB (ref >90)
GFR, EST NON AFRICAN AMERICAN: 12 mL/min — AB (ref >90)
Glucose, Bld: 106 mg/dL — ABNORMAL HIGH (ref 70–99)
Potassium: 4.1 mmol/L (ref 3.5–5.1)
SODIUM: 140 mmol/L (ref 135–145)

## 2014-06-30 LAB — GLUCOSE, CAPILLARY
GLUCOSE-CAPILLARY: 109 mg/dL — AB (ref 70–99)
GLUCOSE-CAPILLARY: 92 mg/dL (ref 70–99)
Glucose-Capillary: 95 mg/dL (ref 70–99)
Glucose-Capillary: 95 mg/dL (ref 70–99)

## 2014-06-30 LAB — MAGNESIUM: Magnesium: 2.6 mg/dL — ABNORMAL HIGH (ref 1.5–2.5)

## 2014-06-30 LAB — PHOSPHORUS: PHOSPHORUS: 7.3 mg/dL — AB (ref 2.3–4.6)

## 2014-06-30 NOTE — Progress Notes (Addendum)
Pt continues to pull at mask but requests for the Bipap mask to stay on. When transitioned to the non rebreather pt states "its not enough air". Pt continues to try and get out of bed. Dr Elsworth Soho notified. Order obtained for additional dose of Haldol if QTC <.5. Pt QTC .49. Will continue to monitor.

## 2014-06-30 NOTE — Progress Notes (Signed)
Very agitated last night - received haloperidol and trazodone. Now very sedate on BiPAP  Filed Vitals:   06/30/14 1300 06/30/14 1400 06/30/14 1500 06/30/14 1520  BP: 138/91 131/95 130/77 130/77  Pulse: 85 75 88 90  Temp:      TempSrc:      Resp: 30 29 31 28   Height:      Weight:      SpO2: 99% 100% 100% 100%   NAD, RASS -3 Bilateral crackles, no wheezes Reg, + syst M NABS, soft Trace symmetric LE edema  I have reviewed all of today's lab results. Relevant abnormalities are discussed in the A/P section  CXR: severe edema pattern  IMPRESSION: Cardiomyopathy Pulmonary edema Agitated delirium Excessive sedation after haloperidol Poor prognosis  PLAN: Cont PRN BiPAP Mgmt of pulm edema per Cards Avoid further Haldol DNR/DNI status appropriate and confirmed with family by Dr Burt Knack who has long hx with this pt   Merton Border, MD ; [facility] service Mobile [phone].  After 5:30 PM or weekends, call [phone]

## 2014-06-30 NOTE — Progress Notes (Signed)
18 you with remote CABG and PCI 2006 who presented 1/1 with acute ALMI. Notwithstanding age, given functional status, he was taken for emergent PCI and underwent DES stenting of SVG>>LAD (VF enroute)  Cath Native LM/LAD/LCx?RCA occluded Saphenous pain grafts sequential to LAD and first diagonal: Initially the graft was totally occluded in the mid body just proximal to the previously stented segment. After reperfusion, the graft is seen to be patent with mild stenosis in the segment between the first diagonal branch and LAD. The percent stenosis is estimated at 40%. The native LAD after reperfusion is patent to the apex. There is distal occlusion of the most apical portion of the vessel. The diagonal branches are also shown to be patent.  Saphenous pain grafts sequential to the PDA, PLA, and obtuse marginal branches of the circumflex is patent   Course complicated by cardiogenic shock and respiratory failure VT nonsustained. --  Hypoxic Resp Failure yesterday - placed on BiPAP with IV Lasix given - modest UOP, but Cr & BUN rising. PICC Line placed for IV Access  Became very agitated (per report) -- was given IV Haldol, Trazodone, Benadryl etc b/c inability to sleep / relax.; ? Acute metabolic encephalopathy  Subjective:  Obtunded this AM - rigid & unresponsive; Mouth wide open with Bipap mask in place.  Objective:  Vital Signs in the last 24 hours: Temp:  [97.2 F (36.2 C)-98.5 F (36.9 C)] 98.5 F (36.9 C) (01/05 0700) Pulse Rate:  [69-92] 91 (01/05 0730) Resp:  [24-38] 30 (01/05 0730) BP: (94-139)/(55-103) 122/61 mmHg (01/05 0730) SpO2:  [87 %-100 %] 96 % (01/05 0730) FiO2 (%):  [40 %-100 %] 40 % (01/05 0730) Weight:  [171 lb 4.8 oz (77.7 kg)] 171 lb 4.8 oz (77.7 kg) (01/05 0300)  Intake/Output from previous day: 01/04 0701 - 01/05 0700 In: 220 [P.O.:40; I.V.:80; IV Piggyback:100] Out: 800 [Urine:800] Intake/Output from this shift:    Physical Exam: General appearance:  appears stated age, mild distress, pale, uncooperative and frail, eldeerly man; obtunded & rigid.  unresponsive Neck: no adenopathy and no carotid bruit Lungs: diffuse coarse rhonchous airway sounds Heart: regular rate and rhythm, S1, S2 normal and + S4, + 2/6 HSM @ apex Abdomen: soft, non-tender; bowel sounds normal; no masses,  no organomegaly and no HSM Extremities: edema trace LE & UE edema Pulses: diminished, but palpable  Skin: diffuse ecchymoses from venopuncture Neurologic: Mental status: alertness: obtunded, unresponsive   Lab Results:  Recent Labs  06/29/14 0802 06/30/14 0420  WBC 15.3* 13.4*  HGB 10.1* 9.6*  PLT 148* 141*    Recent Labs  06/29/14 0540 06/30/14 0420  NA 139 140  K 4.8 4.1  CL 101 99  CO2 23 22  GLUCOSE 109* 106*  BUN 51* 64*  CREATININE 3.82* 4.02*    Recent Labs  06/29/14 0540  TROPONINI >80.00*   Hepatic Function Panel  Recent Labs  06/29/14 0540  PROT 5.7*  ALBUMIN 2.5*  AST 126*  ALT 153*  ALKPHOS 69  BILITOT 1.1   No results for input(s): CHOL in the last 72 hours. No results for input(s): PROTIME in the last 72 hours.  Imaging: Imaging results have been reviewed - by my view, Bilateral effusions with interstitial infiltrate suggesting pulmonary edema  Cardiac Studies:  Assessment/Plan:  Principal Problem:   Acute MI anterior wall first episode care Active Problems:   Essential hypertension   Coronary atherosclerosis of artery bypass graft   Acute respiratory failure with hypoxia  Ventricular tachycardia, non-sustained   Acute renal failure superimposed on stage 3 chronic kidney disease   Aspiration pneumonia   Acute combined systolic and diastolic heart failure   CAD S/P percutaneous coronary angioplasty: PCI to SVG-LAD   ATN (acute tubular necrosis)   Contrast dye induced nephropathy   Metabolic encephalopathy   Hyperlipidemia  Resolved Cardiogenic Shock with Lactic Acidosis; was complicated by Shock Liver  & A on C Renal Failure.  Now with worsening Encephalopathy - likely combination of metabolic & medication related & ICU Delirium.  Hold all sedation meds & only use low doses (with age & Hepatic + Renal dysfunction- cannot tolerate high doses), will discuss with Pharmacist to determine safest option  The concerning issue is how to manage agitation beyond re-direction  Acute Hypoxic Resp Failure again this AM - most likely related to Acute CHF & now perhaps more Asp PNA--> increased WOB,   Continue with BiPAP -- DNI for now (will need Family discussion; was not here until later yesterday); Limited Code, CPR &Meds OK along with BiPAP  D/c IV Lasix with increasing BUN/Cr.  Continue Abx for now as there was concern for Asp PNA.    S/p PCI to SVG-LAD - on ASA, Plavix; holding statin due to Shock Liver   Shock Liver - (will follow LFTs with AML   A on C Renal Failure - follow Cr, but will need to diurese (will hold off on afterload reduction in order to optimize renal blood flow); Despite poor renal Fxn, will ask for PICC line placement for IV access & blood draws (would not likely be a HD candidate & long term access not as vital).  No further VT on Tele, no BB or antiarrythmic for now - awaiting return of hepatic/renal fxn & stable BP  Is on IV Abx for presumed Asp PNA - plan is to de-escalate once more clinically stable - defer to PCCM    Overall, long-term prognosis is poor with new Anterior WMA & reduced EF with underlying severe CAD & CKD-3 (~4).  Will continue Code Status & End Of Life Discussion.  Now with worsening mental status, his potential for recovery is becoming more bleak.   The patient is critically ill with multiple organ system failure requiring extensive medical decision making & patient discussions, consultations. Total Critical Care Time 60 min.   LOS: 4 days    HARDING, DAVID W 06/30/2014, 8:57 AM

## 2014-06-30 NOTE — Progress Notes (Signed)
NUTRITION FOLLOW-UP  INTERVENTION:  If pt is able to wean off BiPAP and tolerate PO's will supplement diet as appropriate.   If pt requires enteral nutrition support recommend initiate Vital AF 1.2 @ 55 ml/hr   Tube feeding regimen provides 1596 kcal, 99 grams of protein, and 1070 ml of H2O.   NUTRITION DIAGNOSIS: Inadequate oral intake related to inability to eat as evidenced by NPO/Vent Status; ongoing.   Goal: Pt to meet >/= 90% of their estimated nutrition needs; not met.   Monitor:  Vent status, TF initiation/tolerance, weight trend, labs  ASSESSMENT: 81 year old male, developed 4 days chest pain history that worsened and diagnsed with acute anterolateral MI and s/p brief V Fib arrest on way to hospital with ROSC and mental status but cardiogenic shock on leveophed and dopamine.   Pt extubated and now on bipap. Pt currently obtunded, MD d/c'ed all sedation medications. Per notes pt with poor long-term prognosis and will need family discussion.  Discussed with RN.  Elevated magnesium and phosphorus.   Height: Ht Readings from Last 1 Encounters:  07/02/2014 5' 10" (1.778 m)    Weight: Wt Readings from Last 1 Encounters:  06/30/14 171 lb 4.8 oz (77.7 kg)  Admission weight: 150 lb (68 kg) 1/1  BMI:  Body mass index is 24.58 kg/(m^2).  Estimated Nutritional Needs: Kcal: 1500-1700 Protein: 85-95 grams Fluid: > 1.5 L/day  Skin: +2 RLE and LLE edema Pressure ulcer stage I on sacrum   Diet Order: Diet NPO time specified   Intake/Output Summary (Last 24 hours) at 06/30/14 1412 Last data filed at 06/30/14 1400  Gross per 24 hour  Intake    260 ml  Output    907 ml  Net   -647 ml    Last BM: 1/4  Labs:   Recent Labs Lab 06/28/14 0431 06/29/14 0540 06/30/14 0420  NA 137 139 140  K 4.0 4.8 4.1  CL 102 101 99  CO2 26 23 22  BUN 40* 51* 64*  CREATININE 3.28* 3.82* 4.02*  CALCIUM 7.3* 7.5* 7.6*  MG 2.3 2.5 2.6*  PHOS 5.8* 6.8* 7.3*  GLUCOSE 107* 109* 106*     CBG (last 3)   Recent Labs  06/29/14 2134 06/30/14 0724 06/30/14 1107  GLUCAP 118* 109* 95    Scheduled Meds: . ampicillin-sulbactam (UNASYN) IV  1.5 g Intravenous Q12H  . aspirin  81 mg Oral Daily  . clopidogrel  75 mg Oral Q breakfast  . heparin  5,000 Units Subcutaneous 3 times per day  . insulin aspart  2-6 Units Subcutaneous TID AC & HS  . sodium chloride  10-40 mL Intracatheter Q12H  . sodium chloride  3 mL Intravenous Q12H    Continuous Infusions:     RD, LDN, CNSC [phone] Pager [phone] After Hours Pager  

## 2014-07-01 ENCOUNTER — Inpatient Hospital Stay (HOSPITAL_COMMUNITY): Payer: Medicare Other

## 2014-07-01 DIAGNOSIS — I501 Left ventricular failure: Secondary | ICD-10-CM | POA: Insufficient documentation

## 2014-07-01 DIAGNOSIS — I219 Acute myocardial infarction, unspecified: Secondary | ICD-10-CM | POA: Insufficient documentation

## 2014-07-01 DIAGNOSIS — J9600 Acute respiratory failure, unspecified whether with hypoxia or hypercapnia: Secondary | ICD-10-CM | POA: Insufficient documentation

## 2014-07-01 LAB — COMPREHENSIVE METABOLIC PANEL
ALT: 98 U/L — ABNORMAL HIGH (ref 0–53)
ANION GAP: 16 — AB (ref 5–15)
AST: 56 U/L — ABNORMAL HIGH (ref 0–37)
Albumin: 2.5 g/dL — ABNORMAL LOW (ref 3.5–5.2)
Alkaline Phosphatase: 58 U/L (ref 39–117)
BUN: 74 mg/dL — ABNORMAL HIGH (ref 6–23)
CHLORIDE: 103 meq/L (ref 96–112)
CO2: 25 mmol/L (ref 19–32)
CREATININE: 4.14 mg/dL — AB (ref 0.50–1.35)
Calcium: 7.7 mg/dL — ABNORMAL LOW (ref 8.4–10.5)
GFR calc Af Amer: 13 mL/min — ABNORMAL LOW (ref >90)
GFR calc non Af Amer: 11 mL/min — ABNORMAL LOW (ref >90)
Glucose, Bld: 87 mg/dL (ref 70–99)
Potassium: 3.7 mmol/L (ref 3.5–5.1)
SODIUM: 144 mmol/L (ref 135–145)
TOTAL PROTEIN: 6 g/dL (ref 6.0–8.3)
Total Bilirubin: 1.2 mg/dL (ref 0.3–1.2)

## 2014-07-01 LAB — CBC
HEMATOCRIT: 32.6 % — AB (ref 39.0–52.0)
Hemoglobin: 10.1 g/dL — ABNORMAL LOW (ref 13.0–17.0)
MCH: 29.1 pg (ref 26.0–34.0)
MCHC: 31 g/dL (ref 30.0–36.0)
MCV: 93.9 fL (ref 78.0–100.0)
PLATELETS: 156 10*3/uL (ref 150–400)
RBC: 3.47 MIL/uL — ABNORMAL LOW (ref 4.22–5.81)
RDW: 15.1 % (ref 11.5–15.5)
WBC: 12.1 10*3/uL — ABNORMAL HIGH (ref 4.0–10.5)

## 2014-07-01 LAB — URINE CULTURE: Colony Count: 75000

## 2014-07-01 LAB — GLUCOSE, CAPILLARY
GLUCOSE-CAPILLARY: 87 mg/dL (ref 70–99)
Glucose-Capillary: 85 mg/dL (ref 70–99)
Glucose-Capillary: 84 mg/dL (ref 70–99)

## 2014-07-01 MED ORDER — ASPIRIN 300 MG RE SUPP
300.0000 mg | Freq: Every day | RECTAL | Status: DC
  Administered 2014-07-01 – 2014-07-02 (×2): 300 mg via RECTAL
  Filled 2014-07-01 (×3): qty 1

## 2014-07-01 MED ORDER — MORPHINE SULFATE 2 MG/ML IJ SOLN
1.0000 mg | Freq: Once | INTRAMUSCULAR | Status: AC
  Administered 2014-07-01: 1 mg via INTRAVENOUS
  Filled 2014-07-01: qty 1

## 2014-07-01 MED ORDER — BIOTENE DRY MOUTH MT LIQD
15.0000 mL | OROMUCOSAL | Status: DC
  Administered 2014-07-01 – 2014-07-03 (×11): 15 mL via OROMUCOSAL

## 2014-07-01 MED ORDER — METOPROLOL TARTRATE 1 MG/ML IV SOLN
2.5000 mg | Freq: Four times a day (QID) | INTRAVENOUS | Status: DC
  Administered 2014-07-01 – 2014-07-03 (×10): 2.5 mg via INTRAVENOUS
  Filled 2014-07-01 (×13): qty 5

## 2014-07-01 NOTE — Progress Notes (Addendum)
23 you with remote CABG and PCI 2006 who presented 1/1 with acute ALMI. Notwithstanding age, given functional status, he was taken for emergent PCI and underwent DES stenting of SVG>>LAD (VF enroute)  Cath Native LM/LAD/LCx?RCA occluded Saphenous pain grafts sequential to LAD and first diagonal: Initially the graft was totally occluded in the mid body just proximal to the previously stented segment. After reperfusion, the graft is seen to be patent with mild stenosis in the segment between the first diagonal branch and LAD. The percent stenosis is estimated at 40%. The native LAD after reperfusion is patent to the apex. There is distal occlusion of the most apical portion of the vessel. The diagonal branches are also shown to be patent.  Saphenous pain grafts sequential to the PDA, PLA, and obtuse marginal branches of the circumflex is patent   Course complicated by cardiogenic shock and respiratory failure VT nonsustained. --  Hypoxic Resp Failure yesterday - placed on BiPAP with IV Lasix given - modest UOP, but Cr & BUN rising. PICC Line placed for IV Access  1/4 PM Became very agitated (per report) -- was given IV Haldol, Trazodone, Benadryl etc b/c inability to sleep / relax.; ? Acute metabolic encephalopathy  Subjective:  This morning, he remained relatively attended but more responsive to stimuli. By noon he became more alert following commands and speaking in short phrases. Denies chest pain or dyspnea.  Objective:  Vital Signs in the last 24 hours: Temp:  [97.2 F (36.2 C)-98.6 F (37 C)] 98.3 F (36.8 C) (01/06 1127) Pulse Rate:  [28-103] 83 (01/06 1239) Resp:  [22-35] 28 (01/06 1239) BP: (111-151)/(57-96) 140/67 mmHg (01/06 1200) SpO2:  [88 %-100 %] 89 % (01/06 1239) FiO2 (%):  [40 %-55 %] 55 % (01/06 1225) Weight:  [171 lb 4.8 oz (77.7 kg)] 171 lb 4.8 oz (77.7 kg) (01/06 0434)  Intake/Output from previous day: 01/05 0701 - 01/06 0700 In: 353 [I.V.:253; IV  Piggyback:100] Out: 1092 [Urine:1092] Intake/Output from this shift: Total I/O In: 40 [I.V.:40] Out: 410 [Urine:410]  Physical Exam: General appearance: appears stated age, mild distress, pale, uncooperative and frail, elderly man; overall less obtundent and rigid, but initially  unresponsive Neck: no adenopathy and no carotid bruit Lungs: diffuse coarse rhonchous airway sounds Heart: regular rate and rhythm, S1, S2 normal and + S4, + 2/6 HSM @ apex Abdomen: soft, non-tender; bowel sounds normal; no masses,  no organomegaly and no HSM Extremities: edema trace LE & UE edema Pulses: diminished, but palpable  Skin: diffuse ecchymoses from venopuncture Neurologic: Mental status: alertness: obtunded, unresponsive  Lab Results:  Recent Labs  06/30/14 0420 07/01/14 0430  WBC 13.4* 12.1*  HGB 9.6* 10.1*  PLT 141* 156    Recent Labs  06/30/14 0420 07/01/14 0430  NA 140 144  K 4.1 3.7  CL 99 103  CO2 22 25  GLUCOSE 106* 87  BUN 64* 74*  CREATININE 4.02* 4.14*    Recent Labs  06/29/14 0540  TROPONINI >80.00*   Hepatic Function Panel  Recent Labs  07/01/14 0430  PROT 6.0  ALBUMIN 2.5*  AST 56*  ALT 98*  ALKPHOS 58  BILITOT 1.2   No results for input(s): CHOL in the last 72 hours. No results for input(s): PROTIME in the last 72 hours.  Imaging: Imaging results have been reviewed - by my view chest x-ray from this morning, Bilateral effusions with interstitial infiltrate suggesting pulmonary edema and stable.  Cardiac Studies: Reviewed Sinus rhythm to sinus tachycardia.  No arrhythmias.  Assessment/Plan:  Principal Problem:   Acute MI anterior wall first episode care Active Problems:   Essential hypertension   Coronary atherosclerosis of artery bypass graft   Acute respiratory failure with hypoxia   Ventricular tachycardia, non-sustained   Acute renal failure superimposed on stage 3 chronic kidney disease   Aspiration pneumonia   Acute combined systolic  and diastolic heart failure   CAD S/P percutaneous coronary angioplasty: PCI to SVG-LAD   ATN (acute tubular necrosis)   Contrast dye induced nephropathy   Metabolic encephalopathy   Hyperlipidemia  Resolved Cardiogenic Shock with Lactic Acidosis; was complicated by Shock Liver & A on C Renal Failure.  Now with worsening Encephalopathy - likely combination of metabolic & medication related & ICU Delirium. - Hopefully this appears to be resolving  Hold all sedation meds & only use low doses (with age & Hepatic + Renal dysfunction- cannot tolerate high doses),  The concerning issue is how to manage agitation beyond re-direction  Palliative Care Consult to assist with symptom management and discussing likely end of life care.  Acute Hypoxic Resp Failure again this AM - most likely related to Acute CHF & now perhaps more Asp PNA --> has been on BiPAP now for almost 2 days. Continues to have evidence of pulmonary edema on chest x-ray.,    Now officially DO NOT RESUSCITATE/DO NOT INTUBATE  Was on minimal settings BiPAP This morning. [facility] M, they tried nasal cannula which was not successful. Required being back on nonrebreather mask to maintain saturations greater than 90%. Was 88% on high flow Ventimask. -> At this point I think settling for oxygen saturations of 88% or higher is acceptable. Would probably still want to use BiPAP at night.  IV Lasix was stopped due to increasing BUN and creatinine. I think this may be more related to combination of ATN and contrast nephropathy as opposed to over diuresis. However since his urine output has done relatively well I will continue to hold but likely reduce the morning.  Continue Abx for now as there was concern for Asp PNA.    S/p PCI to SVG-LAD - on ASA, Plavix; holding statin due to Shock Liver   We'll start low-dose beta blocker today as his heart rate is increased along with his blood pressure. Currently not able to assure stable by mouth  therapy. I have written for IV Lopressor today. Will convert to oral medications once he has shown to be stable with taking by mouth.  Unable to take aspirin and Plavix yesterday. He may wake up enough today to take it. If not I have increased his aspirin to 325 mg PR for today.  Shock Liver -  much improved liver enzymes today.  A on C Renal Failure - follow Cr, but will need to diurese (will hold off on afterload reduction in order to optimize renal blood flow); Despite poor renal Fxn, will ask for PICC line placement for IV access & blood draws (would not likely be a HD candidate & long term access not as vital).  No further VT on Tele, no BB or antiarrythmic for now - awaiting return of hepatic/renal fxn & stable BP  Is on IV Abx for presumed Asp PNA - plan is to de-escalate once more clinically stable - defer to PCCM    Overall, long-term prognosis is poor with new Anterior WMA & reduced EF with underlying severe CAD & CKD-3 (~4).  Will continue Code Status & End Of Life Discussion.  I agree with Dr. Alva Garnet 's estimate that is highly unlikely that he will survive to discharge. Appreciate [facility] M assistance.  Palliative Care Consult requested.  As formally made DO NOT RESUSCITATE/DO NOT INTUBATE.  Once we are able to stabilize his oxygenation levels considered possibility of transfer him out of the ICU to stepdown unit.   LOS: 5 days    Andron Marrazzo W 07/01/2014, 1:13 PM

## 2014-07-01 NOTE — Progress Notes (Signed)
Spoke with Dr. Ellyn Hack about pt SpO2 90% on non rebreather. New SpO2 goal 88% or above. RN to switch to veni mask if pt tolerates. Bipap as needed and at night. Will continue to monitor.

## 2014-07-01 NOTE — Progress Notes (Signed)
**Note De-Identified  Obfuscation** Patient removed from [facility] and placed on 4L [place].  RT to continue monitor.

## 2014-07-01 NOTE — Progress Notes (Signed)
Calm. Slightly more responsive. Comfortable on BiPAP  Filed Vitals:   07/01/14 0714 07/01/14 0800 07/01/14 0900 07/01/14 1000  BP: 151/86 147/70 119/57 132/60  Pulse: 91 103 81 83  Temp: 98.6 F (37 C)     TempSrc: Axillary     Resp: 22 28 27 26   Height:      Weight:      SpO2: 100% 100% 100% 98%   NAD, RASS -1 Bilateral crackles, no wheezes Reg, + syst M NABS, soft Trace symmetric LE edema  I have reviewed all of today's lab results. Relevant abnormalities are discussed in the A/P section  CXR: better aeration, persistent edema pattern  IMPRESSION: Cardiomyopathy Pulmonary edema Agitated delirium Excessive sedation after haloperidol Poor prognosis  PLAN: Change BiPAP to PRN Mgmt of pulm edema per Cards Avoid further Haldol I concur with Dr Allison Quarry plan for Palliative Care involvement I have clarified code status to full DNR based on my conversations with Dr Burt Knack and Dr Ellyn Hack and based on NP Whiteheart's conversation 06/29/14 Probably could be transferred to a lower level of care as we will not undertake any therapies specific to the ICU setting  It is highly unlikely that he will survive to discharge and even less likely that he will recover functional status. He is now DNR/DNI which is certainly appropriate. There is little for PCCM to add @ this point. We ill sign off. Please cal if we can be of further assistance   Merton Border, MD ; [facility] [phone].  After 5:30 PM or weekends, call [phone]

## 2014-07-01 NOTE — Progress Notes (Signed)
[place] Progress Note Patient Name: TAN CLOPPER DOB: 1919/10/21 MRN: [id number]   Date of Service  07/01/2014  HPI/Events of Note  Patient with moderate dyspnea and uncomfortable  eICU Interventions  Palliative care consult pending. DNR Morphine 1mg  IV x 1 ordered (for dyspnea\air hunger)        Eryx Zane 07/01/2014, 6:15 PM

## 2014-07-02 DIAGNOSIS — R410 Disorientation, unspecified: Secondary | ICD-10-CM | POA: Insufficient documentation

## 2014-07-02 DIAGNOSIS — Z515 Encounter for palliative care: Secondary | ICD-10-CM | POA: Insufficient documentation

## 2014-07-02 LAB — GLUCOSE, CAPILLARY: Glucose-Capillary: 88 mg/dL (ref 70–99)

## 2014-07-02 LAB — CBC
HCT: 33.9 % — ABNORMAL LOW (ref 39.0–52.0)
Hemoglobin: 10 g/dL — ABNORMAL LOW (ref 13.0–17.0)
MCH: 28.2 pg (ref 26.0–34.0)
MCHC: 29.5 g/dL — ABNORMAL LOW (ref 30.0–36.0)
MCV: 95.8 fL (ref 78.0–100.0)
Platelets: 171 10*3/uL (ref 150–400)
RBC: 3.54 MIL/uL — AB (ref 4.22–5.81)
RDW: 15 % (ref 11.5–15.5)
WBC: 12.2 10*3/uL — AB (ref 4.0–10.5)

## 2014-07-02 LAB — COMPREHENSIVE METABOLIC PANEL
ALT: 69 U/L — ABNORMAL HIGH (ref 0–53)
ANION GAP: 15 (ref 5–15)
AST: 34 U/L (ref 0–37)
Albumin: 2.3 g/dL — ABNORMAL LOW (ref 3.5–5.2)
Alkaline Phosphatase: 54 U/L (ref 39–117)
BUN: 97 mg/dL — AB (ref 6–23)
CHLORIDE: 107 meq/L (ref 96–112)
CO2: 26 mmol/L (ref 19–32)
CREATININE: 3.9 mg/dL — AB (ref 0.50–1.35)
Calcium: 8 mg/dL — ABNORMAL LOW (ref 8.4–10.5)
GFR calc Af Amer: 14 mL/min — ABNORMAL LOW (ref >90)
GFR, EST NON AFRICAN AMERICAN: 12 mL/min — AB (ref >90)
Glucose, Bld: 99 mg/dL (ref 70–99)
Potassium: 4 mmol/L (ref 3.5–5.1)
Sodium: 148 mmol/L — ABNORMAL HIGH (ref 135–145)
Total Bilirubin: 0.8 mg/dL (ref 0.3–1.2)
Total Protein: 5.5 g/dL — ABNORMAL LOW (ref 6.0–8.3)

## 2014-07-02 MED ORDER — SODIUM CHLORIDE 0.9 % IV SOLN
INTRAVENOUS | Status: DC
  Administered 2014-07-02: 10:00:00 via INTRAVENOUS

## 2014-07-02 MED ORDER — CLOPIDOGREL BISULFATE 300 MG PO TABS
300.0000 mg | ORAL_TABLET | Freq: Once | ORAL | Status: AC
  Administered 2014-07-02: 300 mg via ORAL
  Filled 2014-07-02: qty 1

## 2014-07-02 MED ORDER — MIRTAZAPINE 15 MG PO TBDP
15.0000 mg | ORAL_TABLET | Freq: Every day | ORAL | Status: DC
  Filled 2014-07-02 (×2): qty 1

## 2014-07-02 NOTE — Progress Notes (Addendum)
65 you with remote CABG and PCI 2006 who presented 1/1 with acute ALMI. Notwithstanding age, given functional status, he was taken for emergent PCI and underwent DES stenting of SVG>>LAD (VF enroute)  Cath Native LM/LAD/LCx?RCA occluded Saphenous pain grafts sequential to LAD and first diagonal: Initially the graft was totally occluded in the mid body just proximal to the previously stented segment. After reperfusion, the graft is seen to be patent with mild stenosis in the segment between the first diagonal branch and LAD. The percent stenosis is estimated at 40%. The native LAD after reperfusion is patent to the apex. There is distal occlusion of the most apical portion of the vessel. The diagonal branches are also shown to be patent.  Saphenous pain grafts sequential to the PDA, PLA, and obtuse marginal branches of the circumflex is patent   Course complicated by cardiogenic shock and respiratory failure VT nonsustained. --  Hypoxic Resp Failure yesterday - placed on BiPAP with IV Lasix given - modest UOP, but Cr & BUN rising. PICC Line placed for IV Access  1/4 PM Became very agitated (per report) -- was given IV Haldol, Trazodone, Benadryl etc b/c inability to sleep / relax.; ? Acute metabolic encephalopathy  Subjective:  Weaned off of BiPAP now using 40% FiO2 by face but. He is awake and responds to commands. Still not speaking. Able to take by mouth applesauce this morning  Objective:  Vital Signs in the last 24 hours: Temp:  [97 F (36.1 C)-98.3 F (36.8 C)] 97.5 F (36.4 C) (01/07 0808) Pulse Rate:  [73-102] 88 (01/07 1100) Resp:  [21-36] 29 (01/07 1100) BP: (102-150)/(49-93) 135/65 mmHg (01/07 1100) SpO2:  [81 %-100 %] 99 % (01/07 1100) FiO2 (%):  [40 %-80 %] 40 % (01/07 1100)  Intake/Output from previous day: 01/06 0701 - 01/07 0700 In: 40 [I.V.:40] Out: 970 [Urine:970] Intake/Output from this shift: Total I/O In: 11.3 [I.V.:11.3] Out: 250 [Urine:250]  Physical  Exam: General appearance: appears stated age, no obvious distress, pale, uncooperative and frail, elderly man; more responsive today. Moves hands and arms to commands. Neck: no adenopathy and no carotid bruit Lungs: Much clearer lung sounds and yesterday. Still interstitial sounds with basal rales/rhonchi Heart: regular rate and rhythm, S1, S2 normal and + S4, + 2/6 HSM @ apex Abdomen: soft, non-tender; bowel sounds normal; no masses,  no organomegaly and no HSM Extremities: Minimal LE & UE edema Pulses: diminished, but palpable  Skin: diffuse ecchymoses from venopuncture Neurologic: Mental status: Awake and alert. Follows commands. Still not speaking; is hard of hearing  Lab Results:  Recent Labs  07/01/14 0430 07/02/14 0845  WBC 12.1* 12.2*  HGB 10.1* 10.0*  PLT 156 171    Recent Labs  07/01/14 0430 07/02/14 0845  NA 144 148*  K 3.7 4.0  CL 103 107  CO2 25 26  GLUCOSE 87 99  BUN 74* 97*  CREATININE 4.14* 3.90*   No results for input(s): TROPONINI in the last 72 hours.  Invalid input(s): CK, MB Hepatic Function Panel  Recent Labs  07/02/14 0845  PROT 5.5*  ALBUMIN 2.3*  AST 34  ALT 69*  ALKPHOS 54  BILITOT 0.8   No results for input(s): CHOL in the last 72 hours. No results for input(s): PROTIME in the last 72 hours.  Imaging: No new images  Cardiac Studies: Reviewed Sinus rhythm to sinus tachycardia. No arrhythmias.  Assessment/Plan:  Principal Problem:   Acute MI anterior wall first episode care Active Problems:  Essential hypertension   Coronary atherosclerosis of artery bypass graft   Acute respiratory failure with hypoxia   Ventricular tachycardia, non-sustained   Acute renal failure superimposed on stage 3 chronic kidney disease   Aspiration pneumonia   Acute combined systolic and diastolic heart failure   CAD S/P percutaneous coronary angioplasty: PCI to SVG-LAD   ATN (acute tubular necrosis)   Contrast dye induced nephropathy    Metabolic encephalopathy   Hyperlipidemia   Acute respiratory failure   Pulmonary edema, acute, with congestive heart failure   Heart attack  Resolved Cardiogenic Shock with Lactic Acidosis; was complicated by Shock Liver & A on C Renal Failure.  Improving Encephalopathy - likely combination of metabolic & medication related & ICU Delirium. - Hopefully this appears to be resolving  Hold all sedation meds & only use low doses (with age & Hepatic + Renal dysfunction- cannot tolerate high doses),  The concerning issue is how to manage agitation beyond re-direction  Palliative Care Consult hopefully to see today to assist with symptom management and discussing likely end of life care.  Acute Hypoxic Resp Failure seems to have stabilized. Requiring less FiO2. Stable oxygen saturations on face bucket of 40%. - most likely related to Acute CHF & now perhaps more Asp PNA --> may still consider using BiPAP while sleeping at night. But otherwise would continue face bucket  DO NOT RESUSCITATE/DO NOT INTUBATE  Urine output remained stable. Chest x-ray was slightly improved yesterday. We'll continue to hold IV Lasix for now and consider starting by mouth Lasix in the morning.  Continue Abx for now as there was concern for Asp PNA.    S/p PCI to SVG-LAD - on ASA, Plavix; holding statin due to Shock Liver   Was started on low-dose IV beta blocker yesterday. They will take by mouth well today, can convert to by mouth beta blocker morning.  Mr. couple days of Plavix, we gave her 300 mg dose today with applesauce. Can convert back to 75 mg tomorrow. Can likely change aspirin back to by mouth.  Shock Liver -  essentially back to normal with exception of albumin being 2.3. - Can probably restart statin tomorrow  A on C Renal Failure - creatinine seems to have plateaued and is improving. Urine output remained stable. Can likely restart oral Lasix in the morning.  No further VT on Tele, no BB or  antiarrythmic for now - awaiting return of hepatic/renal fxn & stable BP  Is on IV Abx for presumed Asp PNA - plan is to de-escalate once more clinically stable - defer to PCCM   Showing signs of protein malnutrition - has not been ill to take by mouth for couple days. Now gradually advance diet. Would use thickened liquids and applesauce type foods.  Overall, long-term prognosis remains  poor with new Anterior WMA & reduced EF with underlying severe CAD & CKD-3 (~4).  Will continue Code Status & End Of Life Discussion.    Palliative Care Consult requested to assist with symptom management and goals of care. The family is receptive to their assistance..  As formally made DO NOT RESUSCITATE/DO NOT INTUBATE.  I discussed his care level with the ICU nurse. Recommendations based on level of care required would be that we monitor him today in the ICU as he is advancing his diet becoming more alert. If he does well today we'll transfer to stepdown probably tomorrow and potentially telemetry over the weekend.   LOS: 6 days  [place], Evian Salguero W 07/02/2014, 11:55 AM

## 2014-07-02 NOTE — Consult Note (Signed)
Patient Robert Ritter:QHUT MORRILL BOMKAMP      DOB: 02-15-1920      MLY:[id number]     Consult Note from the Palliative Medicine Team at [facility] Requested by: Dr Ellyn Hack     PCP: Walker Kehr, MD Reason for Consultation:Goals of Care, Delirium    Phone Number:[phone]  Assessment/Recommendations: 81 yo male with CAD s/p CABG, HTN, AAA, CKD who presented with acute MI s/p cardiac cath with DES placement.  EF now 25-30%, AKI on CKD, concern for aspiration PNA, delirium, resp failure.  1.  Code Status: DNR  2. Goals of Care: Spoke at length today with wife Robert Ritter and daughter Lelon Frohlich.  There is also another daughter who lives in [place] but not present today.   Reviewed current complex medical issues with Robert Ritter and challenges he faces ahead with ongoing cardiomyopathy, CKD, respiratory failure, delirium, weakness, etc.  Prior to this, Robert Ritter was a very functional 81 yo.  He was still living independently with his wife, able to do yardwork, driving, etc.  Independence always very important to him. He had aneurysm repair and went to [facility] for rehab in the past and d/c'd early because he could not tolerate nursing home environment.  Family is almost certain he would not tolerate long term skilled nursing care again.  I agree with Pulm/CCM that it seems highly unlikely for him to return to functional independence again.  Talked with family about concerns related to sruviving this hospitalization and likely increased care needs in future vs switch to comfort care and hospice.  Given their belief that he would never want to require long term nursing care and his functional independence being so important to him, it seems like they may consider shift to comfort care and hospice.  They understandably need more time to think about this and we will certainly support them in whatever decision they make.  I will follow-up with them tomorrow.   3. Symptom Management:   1. Acute Delirium: Appears he has  waxing/waning mental status. Some trouble with sleep/wake cycle reversal.  Did not tolerate haldol well when given earlier in hospital course (though quite a large dose).  I will go ahead and schedule remeron which comes in ODT to help with sleep wake/cycle. Will be helpful if he can get out of ICU setting soon.  Avoid restraints if able.  If agitation can not be re-directed with nursing or sitter, I would still favor using haldol or seroquel over benzo's.  If needing meditation would use 1-2mg  of haldol rather than 5mg .  Avoid morphine with AKI.   4. Psychosocial/Spiritual:  Married to his wife for 52 years. They live in [place] and have 2 children.  Former Insurance underwriter and loved to travel. [employer] faith.      Brief HPI: Patient is a 81 yo male with PMHx of CAD, HTN, AAA, CKD who presented on 07/14/2014 with chest pain of 4 days duration.  He was subsequently found to have acute MI and taken emergently for cardiac cath requiring DES placement.  His hospital stay has been complicated by AKI on CKD, resp failure requiring intubation (now extubated), delirium, and concerns for PNA related to aspiration. At time of my exam, patient continues to be confused and unable to have meaningful interaction. Palliative care consult placed for delirium management in this 81 yo male as well as ongoing discussions around goals of care.  He was weaned off BiPAP to 40% FiO2 via face shield.  Delirium was  treated with haldol 5mg  which he did not tolerate well per reports. He has had difficulty swallowing pills.   ROS: Unable to obtain 2/2 altered mental status    PMH:  Past Medical History  Diagnosis Date  . CAD (coronary artery disease)     S/P CABG and multiple PCI's  . Hyperlipidemia   . HTN (hypertension)   . AAA (abdominal aortic aneurysm)     5 cm on 2011 assessment  . Gout 2009  . Elevated PSA     Dr Rosana Hoes  . Myocardial infarction   . Renal insufficiency   . Glaucoma   . Central retinal vein occlusion of  left eye   . Skin cancer of face   . Ulcer 07/23/2012    tongue  . Anemia     normocytic  . Myocardial infarction      PSH: Past Surgical History  Procedure Laterality Date  . Coronary artery bypass graft  1981  . Hernia repair    . Inner ear surgery    . Abdominal aortic endovascular stent graft N/A 10/08/2013    Procedure: ABDOMINAL AORTIC ENDOVASCULAR STENT GRAFT;  Surgeon: Elam Dutch, MD;  Location: [facility];  Service: Vascular;  Laterality: N/A;  . Embolization Right 10/08/2013    Procedure: COIL EMBOLIZATION (NESTERS)- RIGHT INTERNAL ILIAC ANEURYSM;  Surgeon: Elam Dutch, MD;  Location: [facility];  Service: Vascular;  Laterality: Right;  . Left heart catheterization with coronary/graft angiogram N/A 06/30/2014    Procedure: LEFT HEART CATHETERIZATION WITH Beatrix Fetters;  Surgeon: Blane Ohara, MD;  Location: [facility] CATH LAB;  Service: Cardiovascular;  Laterality: N/A;   I have reviewed the FH and SH and  If appropriate update it with new information. Allergies  Allergen Reactions  . Sulfa Antibiotics Rash   Scheduled Meds: . antiseptic oral rinse  15 mL Mouth Rinse 6 times per day  . aspirin  300 mg Rectal Daily  . clopidogrel  75 mg Oral Q breakfast  . heparin  5,000 Units Subcutaneous 3 times per day  . metoprolol  2.5 mg Intravenous 4 times per day  . mirtazapine  15 mg Oral QHS  . sodium chloride  10-40 mL Intracatheter Q12H   Continuous Infusions: . sodium chloride 10 mL/hr at 07/02/14 0952   PRN Meds:.acetaminophen, docusate **OR** docusate, nitroGLYCERIN, ondansetron (ZOFRAN) IV, sodium chloride    BP 124/60 mmHg  Pulse 89  Temp(Src) 97.5 F (36.4 C) (Axillary)  Resp 30  Ht 5\' 10"  (1.778 m)  Wt 77.7 kg (171 lb 4.8 oz)  BMI 24.58 kg/m2  SpO2 96%   PPS: 20   Intake/Output Summary (Last 24 hours) at 07/02/14 1555 Last data filed at 07/02/14 1500  Gross per 24 hour  Intake  51.33 ml  Output    935 ml  Net -883.67 ml    Physical  Exam:  General: Drowsy, unable to have meaningful interaction with me HEENT:  [place], sclera anicteric, dry mm, face shield for O2 Neck: supple, no adenopathy Chest:   Scattered rales CVS: RRR Abdomen: soft, NT, ND Ext: warm Neuro: unable to  Follow commands.   Labs: CBC    Component Value Date/Time   WBC 12.2* 07/02/2014 0845   RBC 3.54* 07/02/2014 0845   HGB 10.0* 07/02/2014 0845   HCT 33.9* 07/02/2014 0845   PLT 171 07/02/2014 0845   MCV 95.8 07/02/2014 0845   MCH 28.2 07/02/2014 0845   MCHC 29.5* 07/02/2014 0845   RDW 15.0 07/02/2014  0845   LYMPHSABS 0.5* 06/30/2014 0420   MONOABS 0.7 06/30/2014 0420   EOSABS 0.0 06/30/2014 0420   BASOSABS 0.0 06/30/2014 0420    BMET    Component Value Date/Time   NA 148* 07/02/2014 0845   K 4.0 07/02/2014 0845   CL 107 07/02/2014 0845   CO2 26 07/02/2014 0845   GLUCOSE 99 07/02/2014 0845   BUN 97* 07/02/2014 0845   CREATININE 3.90* 07/02/2014 0845   CREATININE 1.63* 04/21/2014 0924   CALCIUM 8.0* 07/02/2014 0845   GFRNONAA 12* 07/02/2014 0845   GFRAA 14* 07/02/2014 0845    CMP     Component Value Date/Time   NA 148* 07/02/2014 0845   K 4.0 07/02/2014 0845   CL 107 07/02/2014 0845   CO2 26 07/02/2014 0845   GLUCOSE 99 07/02/2014 0845   BUN 97* 07/02/2014 0845   CREATININE 3.90* 07/02/2014 0845   CREATININE 1.63* 04/21/2014 0924   CALCIUM 8.0* 07/02/2014 0845   PROT 5.5* 07/02/2014 0845   ALBUMIN 2.3* 07/02/2014 0845   AST 34 07/02/2014 0845   ALT 69* 07/02/2014 0845   ALKPHOS 54 07/02/2014 0845   BILITOT 0.8 07/02/2014 0845   GFRNONAA 12* 07/02/2014 0845   GFRAA 14* 07/02/2014 0845   1/6 CXR IMPRESSION:  Stable changes of CHF with pulmonary edema and effusions.    Total Time: 90 minutes Greater than 50%  of this time was spent counseling and coordinating care related to the above assessment and plan.   Doran Clay D.O. Palliative Medicine Team at [facility]  Pager: [phone] Team Phone:  [phone]

## 2014-07-03 DIAGNOSIS — K117 Disturbances of salivary secretion: Secondary | ICD-10-CM | POA: Insufficient documentation

## 2014-07-03 DIAGNOSIS — R0600 Dyspnea, unspecified: Secondary | ICD-10-CM

## 2014-07-03 DIAGNOSIS — R0689 Other abnormalities of breathing: Secondary | ICD-10-CM

## 2014-07-03 LAB — COMPREHENSIVE METABOLIC PANEL
ALK PHOS: 54 U/L (ref 39–117)
ALT: 56 U/L — AB (ref 0–53)
AST: 24 U/L (ref 0–37)
Albumin: 2.4 g/dL — ABNORMAL LOW (ref 3.5–5.2)
Anion gap: 12 (ref 5–15)
BUN: 109 mg/dL — AB (ref 6–23)
CALCIUM: 8 mg/dL — AB (ref 8.4–10.5)
CO2: 28 mmol/L (ref 19–32)
Chloride: 110 mEq/L (ref 96–112)
Creatinine, Ser: 3.72 mg/dL — ABNORMAL HIGH (ref 0.50–1.35)
GFR calc Af Amer: 15 mL/min — ABNORMAL LOW (ref >90)
GFR, EST NON AFRICAN AMERICAN: 13 mL/min — AB (ref >90)
Glucose, Bld: 112 mg/dL — ABNORMAL HIGH (ref 70–99)
POTASSIUM: 4 mmol/L (ref 3.5–5.1)
Sodium: 150 mmol/L — ABNORMAL HIGH (ref 135–145)
Total Bilirubin: 0.6 mg/dL (ref 0.3–1.2)
Total Protein: 5.7 g/dL — ABNORMAL LOW (ref 6.0–8.3)

## 2014-07-03 MED ORDER — SODIUM CHLORIDE 0.9 % IV SOLN
0.5000 mg/h | INTRAVENOUS | Status: DC
  Filled 2014-07-03 (×2): qty 2.5

## 2014-07-03 MED ORDER — MORPHINE SULFATE 2 MG/ML IJ SOLN: 1.0000 mg | INTRAMUSCULAR | Status: DC | PRN

## 2014-07-03 MED ORDER — HYDROMORPHONE BOLUS VIA INFUSION
0.5000 mg | INTRAVENOUS | Status: DC | PRN
  Filled 2014-07-03: qty 1

## 2014-07-03 MED ORDER — HYDROMORPHONE HCL 1 MG/ML IJ SOLN: 0.5000 mg | INTRAMUSCULAR | Status: DC

## 2014-07-03 MED ORDER — ATROPINE SULFATE 1 % OP SOLN: 4.0000 [drp] | OPHTHALMIC | Status: DC | PRN

## 2014-07-06 ENCOUNTER — Encounter (HOSPITAL_COMMUNITY): Payer: Medicare Other

## 2014-07-20 ENCOUNTER — Encounter (HOSPITAL_COMMUNITY): Payer: Medicare Other

## 2014-07-27 NOTE — Discharge Summary (Signed)
Physician Discharge Summary       Patient ID: Robert Ritter MRN: [id number] DOB/AGE: 09-25-1919 81 y.o.  Admit date: 07/14/2014 Discharge date: 14-Jul-2014  Admission Diagnoses:  Acute MI anterior wall first episode care  Discharge Diagnoses:  Principal Problem:   Acute MI anterior wall first episode care Active Problems:   Hyperlipidemia   Essential hypertension   Coronary atherosclerosis of artery bypass graft   Acute respiratory failure with hypoxia   Ventricular tachycardia, non-sustained   Acute renal failure superimposed on stage 3 chronic kidney disease   Aspiration pneumonia   Acute combined systolic and diastolic heart failure   CAD S/P percutaneous coronary angioplasty: PCI to SVG-LAD   ATN (acute tubular necrosis)   Contrast dye induced nephropathy   Metabolic encephalopathy   Acute respiratory failure   Pulmonary edema, acute, with congestive heart failure   Heart attack   Palliative care encounter   Acute delirium   Dyspnea and respiratory abnormality   Increased oropharyngeal secretions   Cardiogenic shock  Discharged Condition:  Deceased  Hospital Course:   Mr. Boy is a 81 year old male with history of CAD status post CABG, multiple PCI, hypertension, dyslipidemia, abdominal aortic aneurysm status post endovascular repair was brought to the emergency department with acute onset chest pains. Patient has been having intermittent episodes of epigastric pain over past 4-5 days but acutely worsened this evening when he contacted EMS. At some point, patient had ventricular fibrillation requiring 1 shock with return of spontaneous circulation. He subsequent EKG showed anterior lateral ST elevation myocardial infarction. In addition, patient was noted to be in cardiogenic shock requiring dopamine and norepinephrine to maintain map greater than 55. After much discussion with the family about risk and benefit of the cardiac catheterization including dialysis  patient was brought to the Cath Lab urgently. Of note, patient endorses compliance with dual antiplatelet therapy.   Left heart cath revealed acute anterior MI and cardiogenic shock secondary to acute occlusion of the SVG-LAD/diagonal graft.  Severe native vessel CAD with known occlusion of the left main, LAD, LCx, and total occlusion of the mid-RCA.  Successful primary PCI of the SVG-LAD/diagonal.  At age 26, with moderate aortic insufficiency and significant vascular disease, with previous endovascular aortic aneurysm repair, he was not a candidate for hemodynamic support.  Dr. Burt Knack had a lengthy discussion following the procedure with the patient's family. We continued with supportive measures, inotropic therapy.  The same day the patient required intubation and was extubated the following day..  Echocardiogram revealed an ejection fraction of 25-30%. There was mild AI and mild to moderate MR.  There was concerns for aspiration pneumonia and the patient was started on antibiotics.  He had another episode of acute respiratory failure and  was placed on BiPAP with Lasix.  LFTs continue to trend down.  January 5 patient had worsening encephalopathy from metabolic and medication related sources. Sedation medications were held.  Patient was transitioned to DO NOT RESUSCITATE status. As he failed to show signs of improvement & was unable to be fed without enteral feeding, palliative medicine was consulted to discuss goals of care. On January 8 patient was transferred to med/surg floor for comfort care where he died shortly after arrival.  Multiple lengthy discussions were had with the family & the patient's last lucid statement to the PCCM PA was "you know that I am 81 years old right?" - I don't want that breathing tube again.  Consults: PT CCM, palliative medicine.  Significant Diagnostic Studies:  Cardiac Catheterization Procedure Note  Name: Robert Ritter MRN: [id number] DOB:  [birth year]  Procedure: Left Heart Cath, Selective Coronary Angiography, LV angiography, Aortic root angiogram, PTCA/Stent of the SVG-LAD  Indication: Anterior wall MI.   Diagnostic Procedure Details:  -- Saphenous vein graft sequential to LAD and diagonal was totally occluded . A   2.5 mm balloon was advanced and inflated throughout the stented segment in the distal body of the saphenous pain grafts. This restored TIMI 3 flow.   During the entire procedure, the patient continued to have hypotension, chest pain, smothering feeling of shortness of breath, and vomiting.   Vasopressors were adjusted accordingly. IV lidocaine was administered to stabilize his rhythm as he had hemodynamic instability associated with an accelerated idioventricular rhythm during the procedure.   After reestablishing TIMI-3 flow in the saphenous vein graft, the stented segment was then redilated with a 3.5 mm balloon. It appeared that the culprit for the patient's occlusion was at the proximal adjuvant the previously implanted stent. This area was covered with a 4.0 x 16 mm Promus DES -- 0% residual stenosis.Marland Kitchen py.  PROCEDURAL FINDINGS Hemodynamics: AO 65/42 (increased to 83/49 at the completion of the procedure) LV 72/28  Coronary angiography: Coronary dominance: right  Native Coronaries: Left mainstem, LAD, LCx & RCA: not injected. Known to be occluded  SVG-LAD-D1: 100% mi, prox to prior stent --> PCI, distal LAD remained occluded. Diag patent. SVG- PDA, PLA, and OMBs of the circumflex is patent. The stented segments in the distal body of the graft and at the distal anastomosis to the obtuse marginal branch appear to be patent with mild diffuse in-stent restenosis no greater than 30-40%. There is TIMI-3 flow throughout this graft.  PCI Data: Vessel - LAD/Segment - distal body of saphenous vein graft Percent Stenosis (pre) 100 TIMI-flow 0 Stent a 4.0 x 16 mm Promus DES Percent Stenosis (post) 0 TIMI-flow  (post) 3  Final Conclusions:  1. Acute anterior MI and cardiogenic shock secondary to acute occlusion of the SVG-LAD/diagonal graft 2. Severe native vessel CAD with known occlusion of the left main, LAD, LCx, and total occlusion of the mid-RCA 3. Successful primary PCI of the SVG-LAD/diagonal  Recommendations: The patient is critically ill. Unfortunately he is not a candidate for hemodynamic support at age 64 with moderate aortic insufficiency and significant vascular disease with previous endovascular aortic aneurysm repair. Lengthy discussion following the procedure with the patient's family. We will continue with supportive measures, inotropic therapy, and short-term mechanical ventilation if his respiratory status worsens. Hopefully with reperfusion of his LAD/diagonal, he will improve over the course of the next 24 hours. They understand he is at very high risk of further complications including acute renal failure, respiratory failure, and death.  The patient is critically ill with multiple organ systems failure and requires high complexity decision making for assessment and support, frequent evaluation and titration of therapies, application of advanced monitoring technologies and extensive interpretation of multiple databases.   Critical Care Time devoted to patient care services described in this note is greater than 60 minutes.  Sherren Mocha MD, [facility] 07/23/2014  Echocardiogram Study Conclusions  - Left ventricle: Septal apical and anterior wall hypokinesis The cavity size was mildly dilated. There was mild focal basal hypertrophy of the septum. Systolic function was severely reduced. The estimated ejection fraction was in the range of 25% to 30%. - Aortic valve: There was mild regurgitation. - Mitral valve: Calcified chordal apparatus. Calcified annulus. Mildly thickened leaflets . There was  mild to moderate regurgitation. - Left atrium: The atrium was moderately dilated. -  Atrial septum: No defect or patent foramen ovale was identified  Treatments:  See above  Disposition: Deceased     Signed: Tarri Fuller, [place] 07-25-14, 4:52 PM   I saw the patient on the am of 1/8, and determined that with no signs of forward progression & lack of enteral feeding he would simply continue to decline.  From yesterday to today, he had taken a notable step backwards.  He was no longer responsive to verbal or painful stimuli.  He did not show signs of acknowledging members present. I had a long discussion with the patient's wife, daughter and brother and other family members. We all reed that his progression was not advancing, in fact it was going backward. We felt that he would very unlikely survive the hospital. If so he would have a very low before. The family all agreed that he would not want aggressive measures to be followed. At that time we decided the best course of action was to simply initiate a full palliative care/comfort care measures.  He was transferred to the [place] floor and shortly after arrival I was notified of his death.  Leonie Man, M.D., M.S. Interventional Cardiologist   Pager # [phone]

## 2014-07-27 NOTE — Progress Notes (Signed)
      Dr. Ellyn Hack is aware patient has passed away. Discharge summary to follow.   Angelena Form PA-C  [facility]

## 2014-07-27 NOTE — Care Management Note (Addendum)
    Page 1 of 1   07/05/2014     10:00:44 AM CARE MANAGEMENT NOTE 2014/07/05  Patient:  Robert Ritter, Robert Ritter   Account Number:  [id number]  Date Initiated:  06/29/2014  Documentation initiated by:  Elissa Hefty  Subjective/Objective Assessment:   adm w cardiogenic shock     Action/Plan:   lives w wife, pcp dr Alain Marion   Anticipated DC Date:     Anticipated DC Plan:  [place] offered to / List presented to:             Status of service:   Medicare Important Message given?  YES (If response is "NO", the following Medicare IM given date fields will be blank) Date Medicare IM given:  06/29/2014 Medicare IM given by:  Elissa Hefty Date Additional Medicare IM given:  2014-07-05 Additional Medicare IM given by:  [facility] Jazlyne Gauger  Discharge Disposition:    Per UR Regulation:    If discussed at Long Length of Stay Meetings, dates discussed:   07/02/2014    Comments:

## 2014-07-27 NOTE — Progress Notes (Signed)
Chaplain responded to page for pt family needing support after pt expiration. Chaplain called pt faith community at pt wife request. Chaplain facilitated storytelling, offered prayer, offered hospitality, and facilitated communication between staff and family. Pt pastor has arrived and family awaiting pt brother from [place].    July 15, 2014 1700  Clinical Encounter Type  Visited With Family  Visit Type Death;Spiritual support  Referral From Family;Nurse  Consult/Referral To [facility] community  Spiritual Encounters  Spiritual Needs Grief support;Emotional;Prayer  Stress Factors  Family Stress Factors Loss  Robert Ritter July 15, 2014 5:37 PM

## 2014-07-27 NOTE — Progress Notes (Signed)
24 you with remote CABG and PCI 2006 who presented 1/1 with acute ALMI. Notwithstanding age, given functional status, he was taken for emergent PCI and underwent DES stenting of SVG>>LAD (VF enroute)  Cath Native LM/LAD/LCx?RCA occluded Saphenous pain grafts sequential to LAD and first diagonal: Initially the graft was totally occluded in the mid body just proximal to the previously stented segment. After reperfusion, the graft is seen to be patent with mild stenosis in the segment between the first diagonal branch and LAD. The percent stenosis is estimated at 40%. The native LAD after reperfusion is patent to the apex. There is distal occlusion of the most apical portion of the vessel. The diagonal branches are also shown to be patent.  Saphenous pain grafts sequential to the PDA, PLA, and obtuse marginal branches of the circumflex is patent   Course complicated by cardiogenic shock and respiratory failure VT nonsustained. --  Hypoxic Resp Failure yesterday - placed on BiPAP with IV Lasix given - modest UOP, but Cr & BUN rising. PICC Line placed for IV Access  1/4 PM Became very agitated (per report) -- was given IV Haldol, Trazodone, Benadryl etc b/c inability to sleep / relax.; ? Acute metabolic encephalopathy  Subjective:  This morning was not responsive. Yesterday afternoon was not able to have any more feeding. No acute symptoms, however just increased respiratory rate and shallow breathing  Objective:  Vital Signs in the last 24 hours: Temp:  [96.3 F (35.7 C)-97.9 F (36.6 C)] 96.3 F (35.7 C) (01/08 0700) Pulse Rate:  [73-102] 85 (01/08 0700) Resp:  [26-33] 32 (01/08 0700) BP: (108-150)/(51-93) 122/58 mmHg (01/08 0700) SpO2:  [90 %-100 %] 98 % (01/08 0700) FiO2 (%):  [40 %-80 %] 80 % (01/08 0700)  Intake/Output from previous day: 01/07 0701 - 01/08 0700 In: 211.3 [I.V.:211.3] Out: 825 [Urine:825] Intake/Output from this shift:    Physical Exam: General appearance:  Frail elderly gentleman, pale. Chronically ill-appearing Neck: no adenopathy and no carotid bruit Lungs: Coarse basal rhonchi Heart: regular rate and rhythm, S1, S2 normal and + S4, + 2/6 HSM @ apex Abdomen: soft, non-tender; bowel sounds normal; no masses,  no organomegaly and no HSM Extremities: Minimal LE & UE edema Pulses: diminished, but palpable  Skin: diffuse ecchymoses from venopuncture Neurologic: Borderline obtunded and minimally responsive. No longer responded to commands.  Lab Results:  Recent Labs  07/01/14 0430 07/02/14 0845  WBC 12.1* 12.2*  HGB 10.1* 10.0*  PLT 156 171    Recent Labs  07/02/14 0845 07-26-2014 0435  NA 148* 150*  K 4.0 4.0  CL 107 110  CO2 26 28  GLUCOSE 99 112*  BUN 97* 109*  CREATININE 3.90* 3.72*   No results for input(s): TROPONINI in the last 72 hours.  Invalid input(s): CK, MB Hepatic Function Panel  Recent Labs  2014-07-26 0435  PROT 5.7*  ALBUMIN 2.4*  AST 24  ALT 56*  ALKPHOS 54  BILITOT 0.6   No results for input(s): CHOL in the last 72 hours. No results for input(s): PROTIME in the last 72 hours.  Imaging: No new images  Cardiac Studies: Reviewed Sinus rhythm to sinus tachycardia. No arrhythmias.  Assessment/Plan:  Principal Problem:   Acute MI anterior wall first episode care Active Problems:   Essential hypertension   Coronary atherosclerosis of artery bypass graft   Acute respiratory failure with hypoxia   Ventricular tachycardia, non-sustained   Acute renal failure superimposed on stage 3 chronic kidney disease  Aspiration pneumonia   Acute combined systolic and diastolic heart failure   CAD S/P percutaneous coronary angioplasty: PCI to SVG-LAD   ATN (acute tubular necrosis)   Contrast dye induced nephropathy   Metabolic encephalopathy   Hyperlipidemia   Acute respiratory failure   Pulmonary edema, acute, with congestive heart failure   Heart attack   Palliative care encounter   Acute delirium   Resolved Cardiogenic Shock with Lactic Acidosis; was complicated by Shock Liver & A on C Renal Failure.  At this point, I don't see any signs of meaningful recovery. We are not able to feed him and he is not taking by mouth. The family does not want tube feeds with NG tube or OG tube. He continues to get progressively weaker, Emma no means for nutrition, I don't see him improving anymore. He is breathing spontaneously but with increased rate and more shallow. Notably less responsive today.  At this point long discussion with the family we decided to move toward comfort care measures. I will write to transfer the patient to med surgical floor with no telemetry. We'll continue the Foley catheter and PICC line for access to allow for mild hydration and other medications.  I appreciate palliative care assisting with any other additional comfort care measure orders.  For now I'll continue the beta blocker, but if palate of care feels that this is unnecessary, that can be discontinued.   A total of 45 minutes was spent with the patient and family discussing goals of care.  Leonie Man, M.D., M.S. Interventional Cardiologist   Pager # [phone]        LOS: 7 days    HARDING, DAVID W 07-19-14, 7:57 AM

## 2014-07-27 NOTE — Progress Notes (Signed)
Post mortem care completed, bed control notified,body brought to to the morgue.

## 2014-07-27 NOTE — Progress Notes (Addendum)
Patient Robert Ritter      DOB: [birth year]      RFF:[id number]   Palliative Medicine Team at [facility] Progress Note    Subjective: Unresponsive. Family at bedside. Transferred to 6N for comfort care    Filed Vitals:   07-27-14 1300  BP: 119/52  Pulse: 80  Temp:   Resp: 33   Physical exam: GEN: unresponsive HEENT: dry mm, NRB CV: regular rate LUNGS: coarse sounds, accessory resp muscle use ABD: soft, ND EXT: warm     CBC    Component Value Date/Time   WBC 12.2* 07/02/2014 0845   RBC 3.54* 07/02/2014 0845   HGB 10.0* 07/02/2014 0845   HCT 33.9* 07/02/2014 0845   PLT 171 07/02/2014 0845   MCV 95.8 07/02/2014 0845   MCH 28.2 07/02/2014 0845   MCHC 29.5* 07/02/2014 0845   RDW 15.0 07/02/2014 0845   LYMPHSABS 0.5* 06/30/2014 0420   MONOABS 0.7 06/30/2014 0420   EOSABS 0.0 06/30/2014 0420   BASOSABS 0.0 06/30/2014 0420    CMP     Component Value Date/Time   NA 150* 07/27/2014 0435   K 4.0 2014-07-27 0435   CL 110 Jul 27, 2014 0435   CO2 28 Jul 27, 2014 0435   GLUCOSE 112* 2014-07-27 0435   BUN 109* 2014-07-27 0435   CREATININE 3.72* 27-Jul-2014 0435   CREATININE 1.63* 04/21/2014 0924   CALCIUM 8.0* 07/27/2014 0435   PROT 5.7* 07/27/2014 0435   ALBUMIN 2.4* 07-27-2014 0435   AST 24 07-27-2014 0435   ALT 56* Jul 27, 2014 0435   ALKPHOS 54 2014-07-27 0435   BILITOT 0.6 07/27/14 0435   GFRNONAA 13* 07-27-2014 0435   GFRAA 15* July 27, 2014 0435      Assessment and plan: 81 yo male with CAD s/p CABG, HTN, AAA, CKD who presented with acute MI s/p cardiac cath with DES placement. EF now 25-30%, AKI on CKD, concern for aspiration PNA, delirium, resp failure.   1. Code Status/GOC- DNR, full comfort. Extensive conversation with family today about goals and what to expect. Will simplify med list as unable to take oral.  I think he is transitioning to actively dying and we are likely talking prognosis of hours to days at this point  2. Symptom Management  Pain/Dyspnea- using accessory muscles of breathing and moderate resp distress given his resp rate in 30's.  Will go ahead and start low dose dilaudid infusion for comfort.  I would avoid high doses of morphine with his renal insufficiency to avoid even the small potential risk of OIN.  (Dilaudid works well for dyspnea).  PRN meds available as well.  Will transition to [place] for comfort.  If family desires to d/c O2, that is fine as well.    Secretions- atropine drops PRN, oral care, HOB >30 degrees  Please call with any questions or concerns regarding symptoms.  Expect in hospital death. Doubt he will become stable for transfer to residential hospice facility.   Total Time: 35 minutes >50% of time spent in counseling and coordination of care regarding above plan.  Case discussed with Dr Ellyn Hack.   Doran Clay D.O. Palliative Medicine Team at [facility]  Pager: [phone] Team Phone: [phone]

## 2014-07-27 DEATH — deceased

## 2014-08-28 ENCOUNTER — Ambulatory Visit: Payer: Medicare Other | Admitting: Internal Medicine

## 2014-10-29 ENCOUNTER — Ambulatory Visit: Payer: Medicare Other | Admitting: Vascular Surgery

## 2014-10-29 ENCOUNTER — Other Ambulatory Visit: Payer: Medicare Other

## 2014-12-20 IMAGING — CT CT CTA ABD/PEL W/CM AND/OR W/O CM
3 of 10 series · 10 of 36 positions shown, 15 images · IV contrast (60CC OMNI 350)
Comparison: CT scan of November 20, 2013.

CLINICAL DATA: Abdominal aortic aneurysm without rupture. Aftercare
following surgery of the circulatory system.

EXAM:
CTA ABDOMEN AND PELVIS wITHOUT AND WITH CONTRAST
TECHNIQUE: Multidetector CT imaging of the abdomen and pelvis was performed
using the standard protocol during bolus administration of
intravenous contrast. Multiplanar reconstructed images and MIPs were
obtained and reviewed to evaluate the vascular anatomy.
CONTRAST:  60mL OMNIPAQUE IOHEXOL 350 MG/ML SOLN

[Series 5: angio 2.5 · axial · 0.72mm/px · z∈[-335,-55]mm · 4 of 188 slices shown, 9 images]
[im 38/188  soft-tissue]
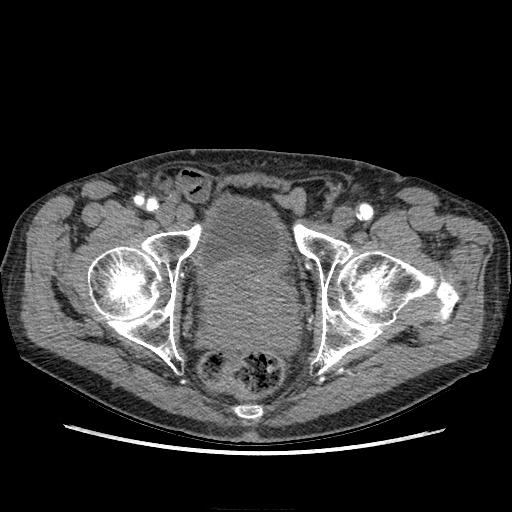
[im 38/188  lung]
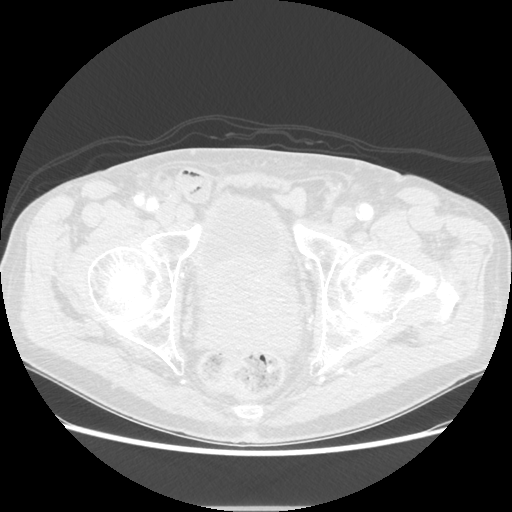
[im 38/188  bone]
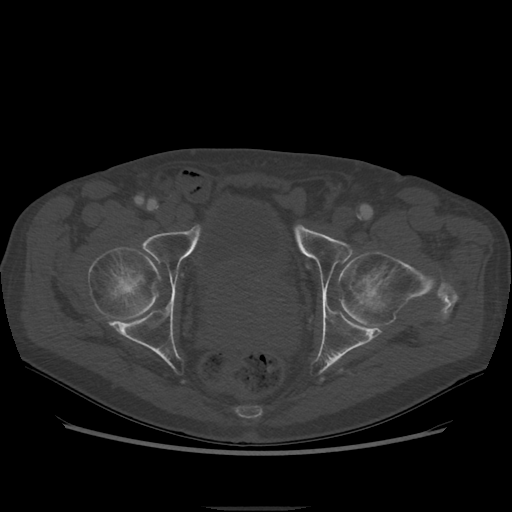
[im 75/188  soft-tissue]
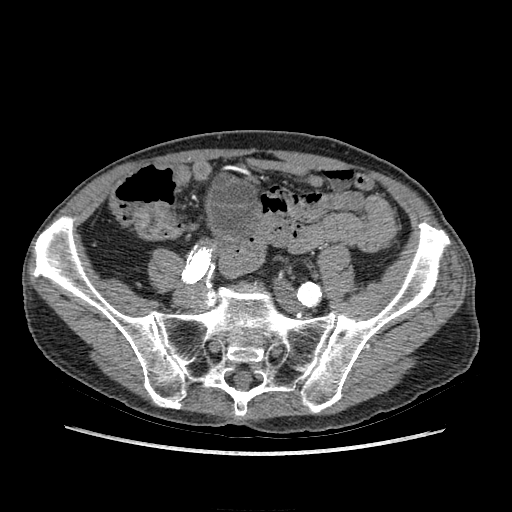
[im 75/188  lung]
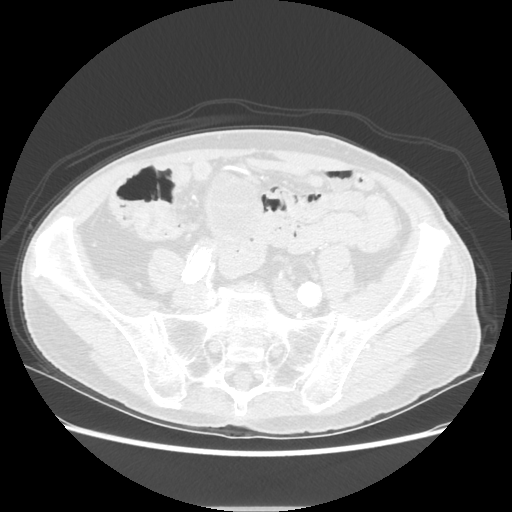
[im 113/188  soft-tissue]
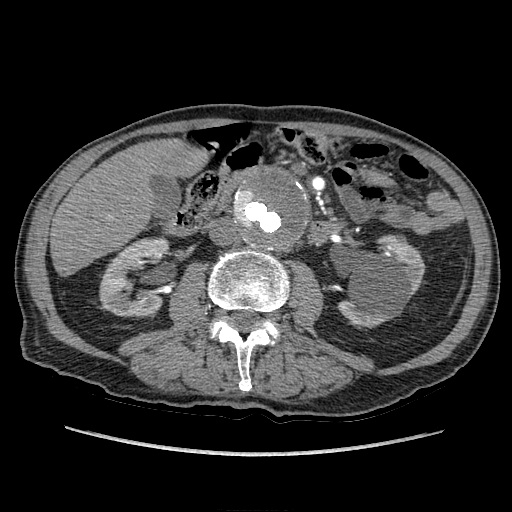
[im 113/188  lung]
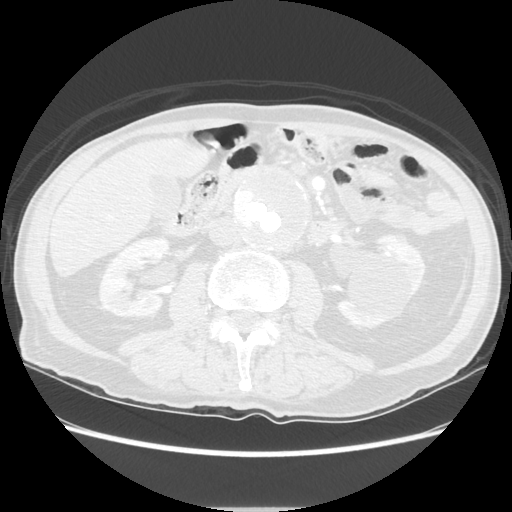
[im 150/188  soft-tissue]
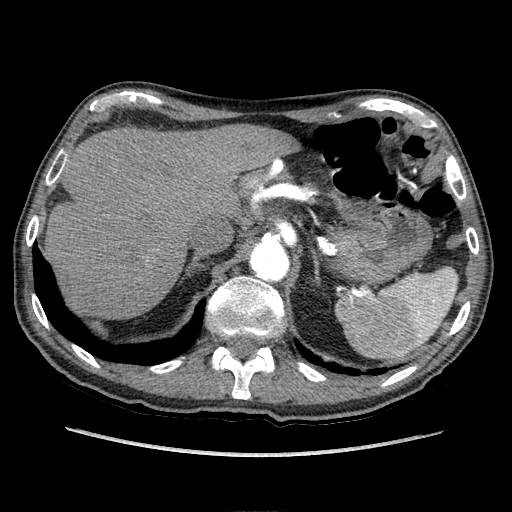
[im 150/188  lung]
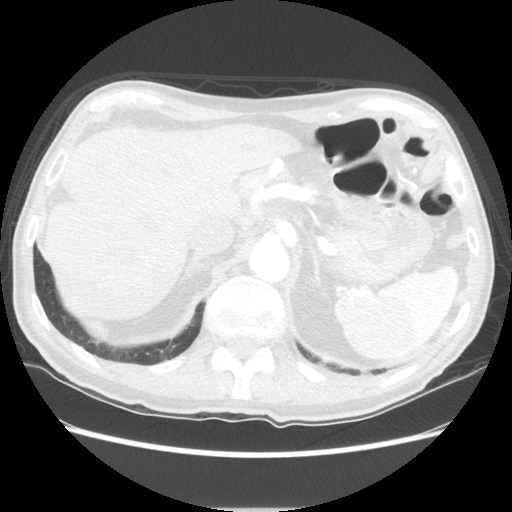

[Series 602: sagittal body · sagittal · 0.92mm/px · 3 of 148 slices shown (1 of 2)]
[im 37/148  soft-tissue]
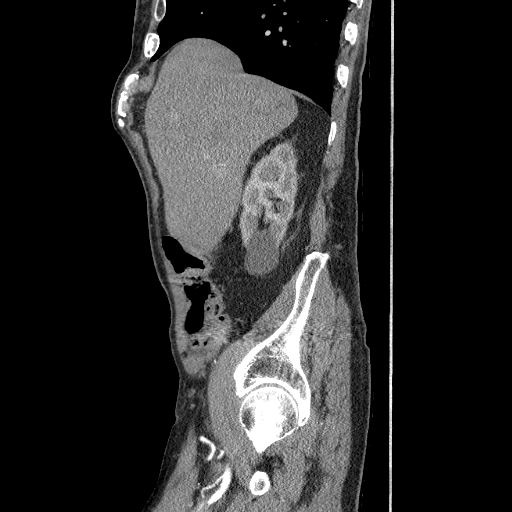
[im 74/148  soft-tissue]
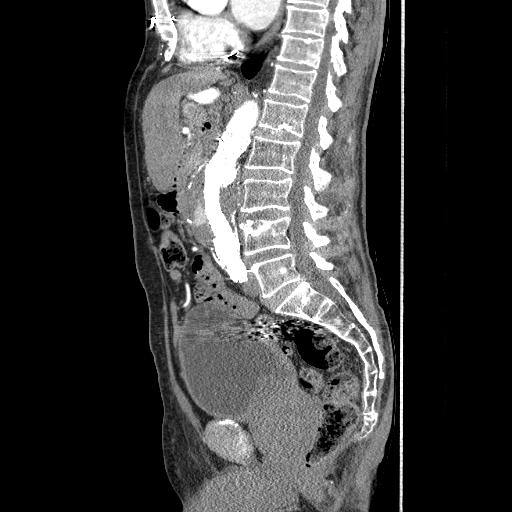
[im 111/148  soft-tissue]
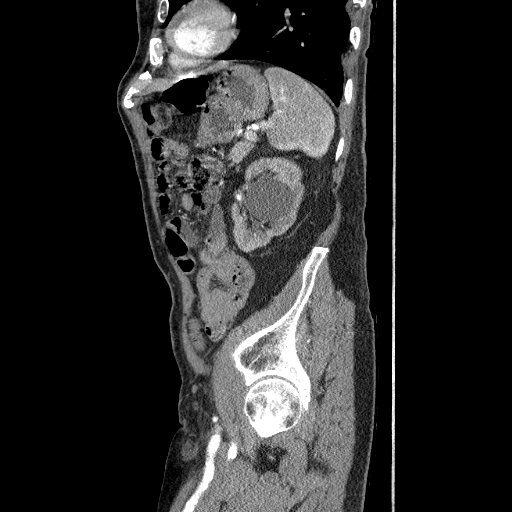

[Series 607: sagittal body · sagittal · 0.72mm/px · 3 of 146 slices shown (2 of 2)]
[im 37/146  soft-tissue]
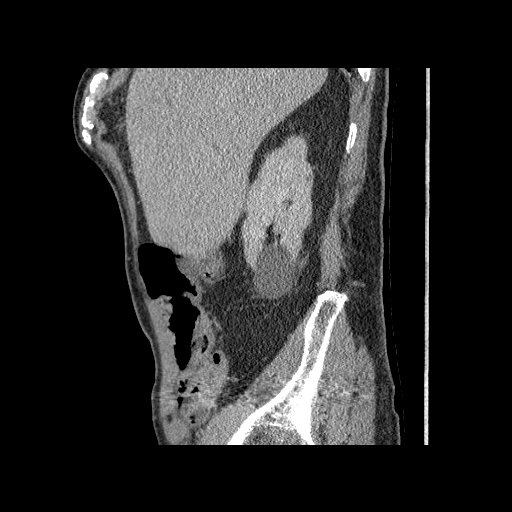
[im 73/146  soft-tissue]
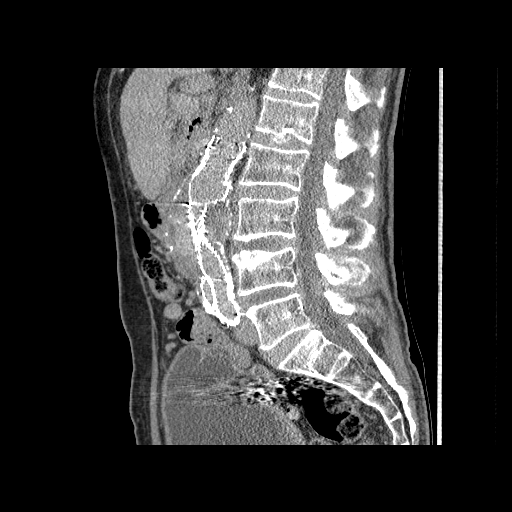
[im 109/146  soft-tissue]
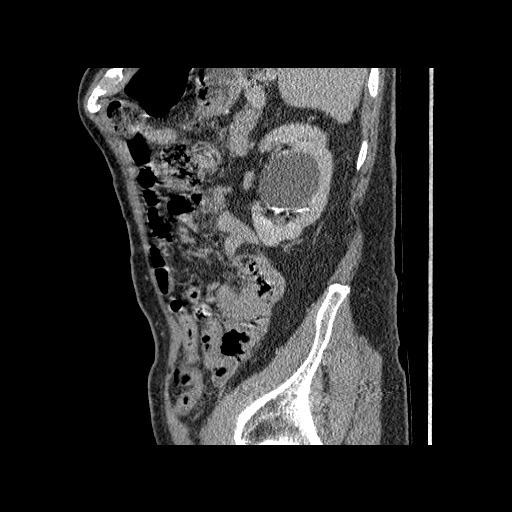

[10 of 36 positions shown; findings below may reference images not displayed]

FINDINGS: No significant abnormality is noted in the visualized lung bases. No
significant osseous abnormality is noted.

No definite abnormality is noted in the liver, spleen or pancreas.
No gallstones are noted. Adrenal glands appear normal. Status post
endograft repair of infrarenal abdominal aortic aneurysm. Stable
type 2 endoleak is seen in the aneurysmal sac compared to prior
exam. This most likely is related to retrograde flow from the
inferior mesenteric artery.

Stable dissection is seen involving the origin of the celiac artery.
Renal arteries are widely patent. Superior mesenteric artery is
widely patent. Aneurysmal sac measures 5.8 x 5.7 cm in size which is
slightly enlarged compared to prior exam. Distal portion of right
limb of graft is seen in right external iliac artery. The patient
has undergone coil embolization of the right internal iliac artery.
Distal end of left limb is in the left common iliac artery. These
limbs and iliac vessels are widely patent without significant
stenosis.

Moderate right inguinal hernia is noted which contains loops of
small bowel, but does not result in incarceration or obstruction.
Severe prostatic enlargement is noted which is unchanged. Urinary
bladder appears normal.

Review of the MIP images confirms the above findings.
IMPRESSION: Status post endograft repair of abdominal aortic aneurysm. Stable
type 2 endoleak is noted in the aneurysmal sac along the right and
anterior site, most likely due to retrograde flow from patent
inferior mesenteric artery.

Aneurysmal sac now measures 5.8 x 5.7 cm in size, which is slightly
enlarged compared to prior exam.

Moderate size right inguinal hernia is again noted, but it now
contains loops of small bowel. However, no evidence of bowel
incarceration or obstruction is noted.

## 2015-02-25 IMAGING — CR DG CHEST 1V PORT
1 series · 1 of 1 positions shown · non-contrast
Comparison: Portable film earlier in the day.

CLINICAL DATA: PICC line placement.

EXAM:
PORTABLE CHEST - 1 VIEW

[AP]
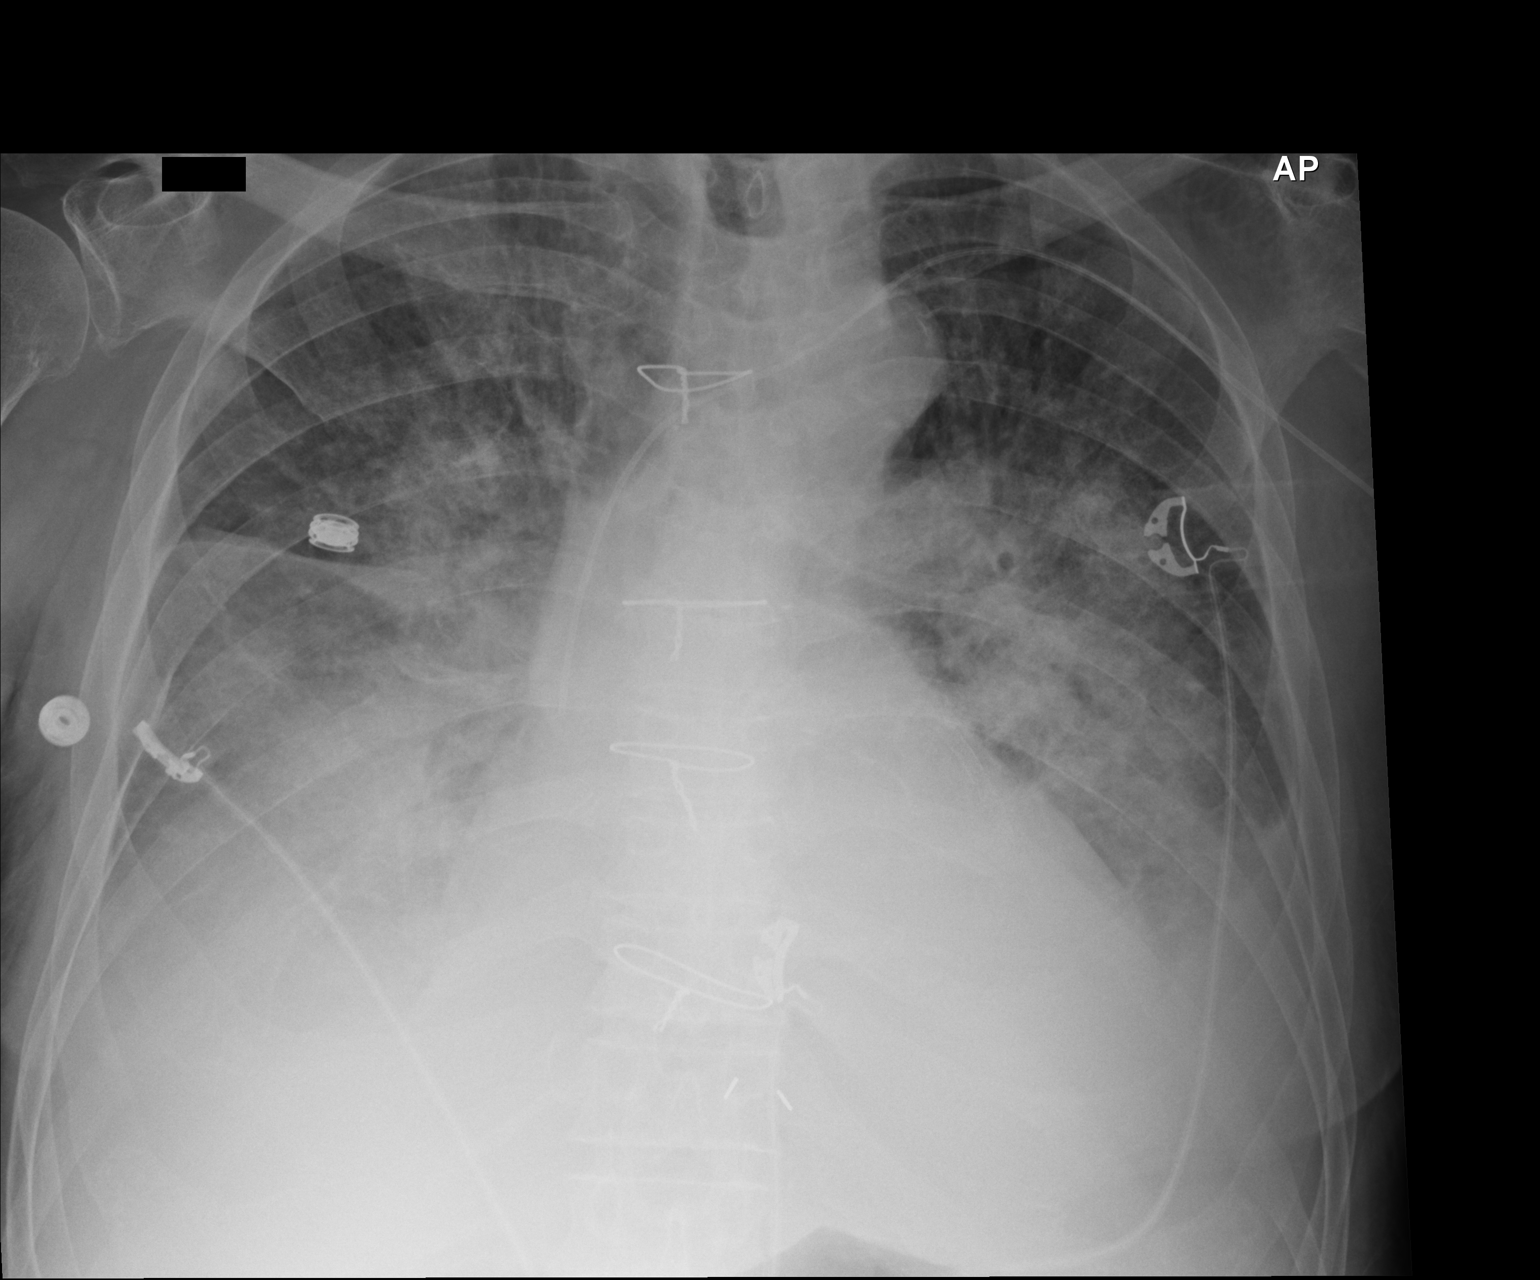

[1 of 1 positions shown; findings below may reference images not displayed]

FINDINGS: Cardiomegaly persists. BILATERAL pulmonary opacities representing
either pneumonia or pulmonary edema appears stable.

A PICC line has been placed from LEFT arm approach. Tip lies
cavoatrial junction. There is no pneumothorax.
IMPRESSION: Stable aeration.

PICC line tip cavoatrial junction.

## 2015-02-27 IMAGING — CR DG CHEST 1V PORT
1 series · 1 of 1 positions shown · non-contrast
Comparison: 06/29/2014

CLINICAL DATA: Hypoxia, CHF

EXAM:
PORTABLE CHEST - 1 VIEW

[AP]
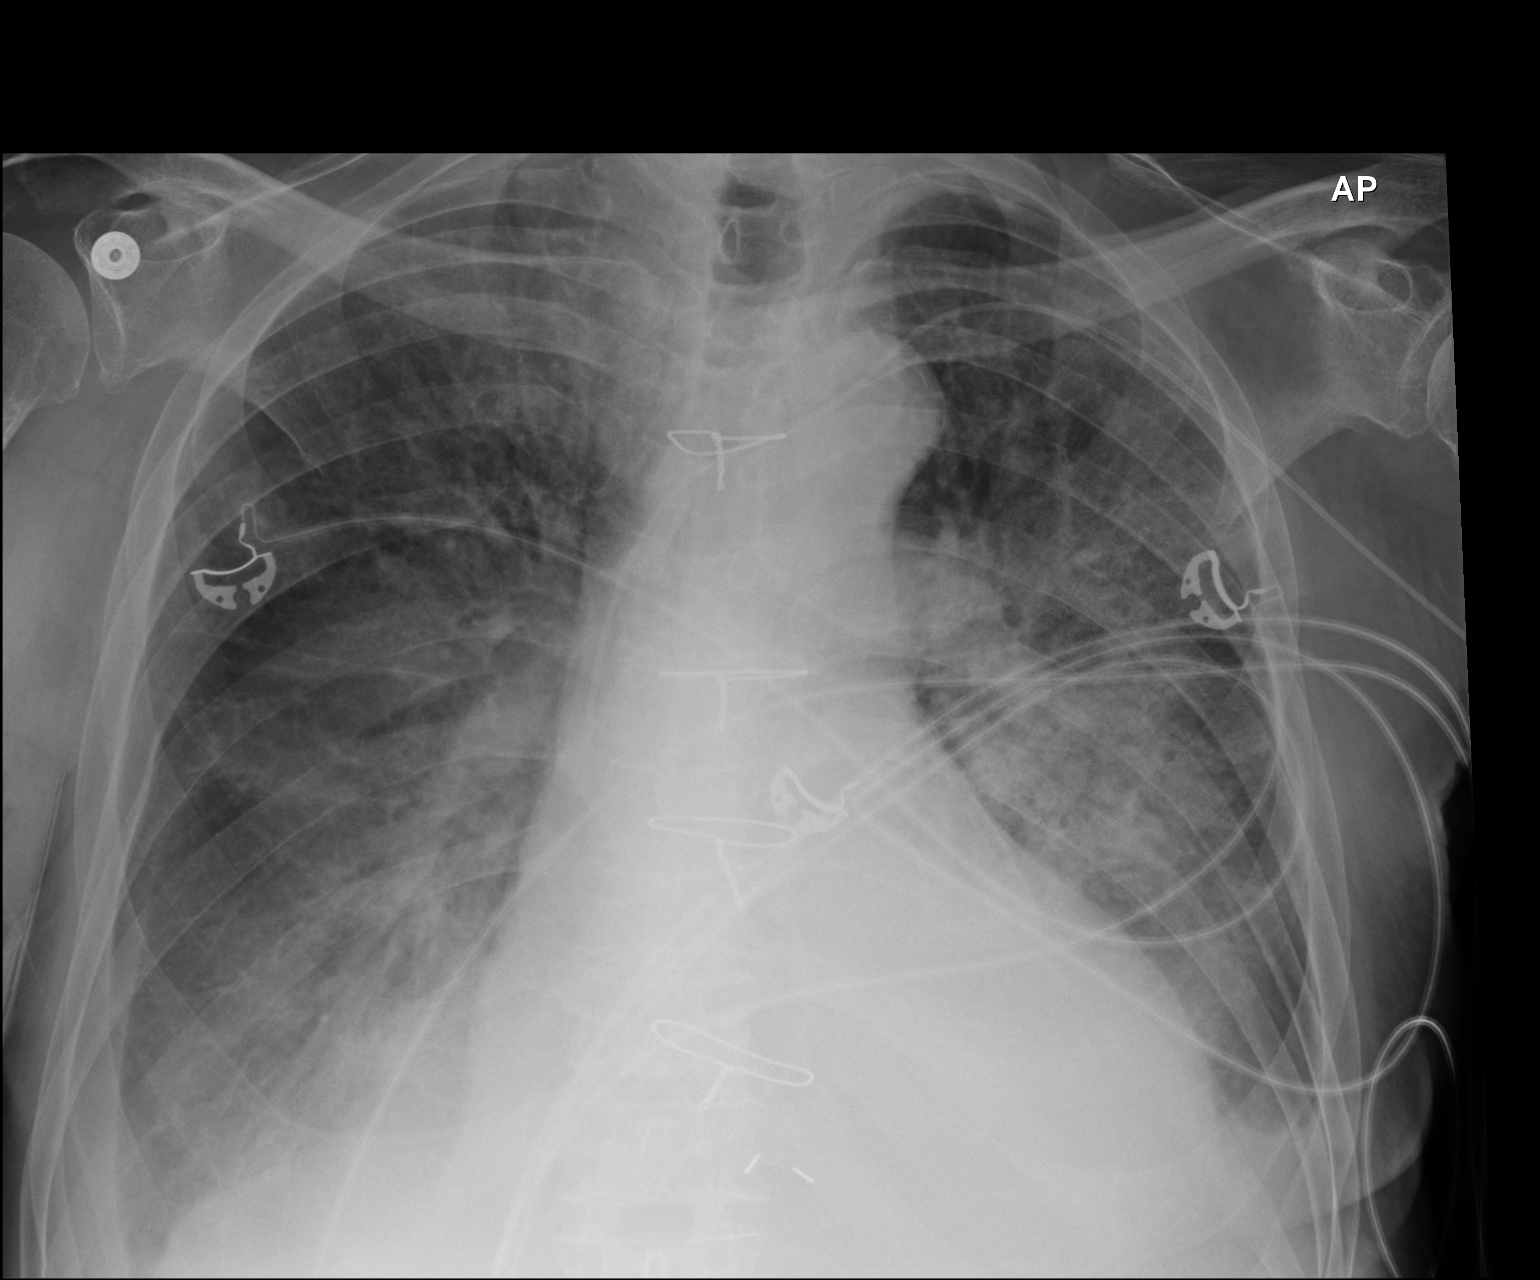

[1 of 1 positions shown; findings below may reference images not displayed]

FINDINGS: Left-sided PICC line is again noted in satisfactory position.
Cardiac shadow is enlarged but stable. Postsurgical changes are
again noted. Diffuse changes of pulmonary edema and vascular
congestion are noted. The overall appearance is stable from the
previous exam. Small bilateral pleural effusions are again noted.
IMPRESSION: Stable changes of CHF with pulmonary edema and effusions.
